# Patient Record
Sex: Female | Born: 1940 | Race: White | Hispanic: No | Marital: Married | Smoking: Never smoker
Health system: Southern US, Community
[De-identification: ages and names within clinical notes are randomized; demographics above are authoritative.]

## PROBLEM LIST (undated history)

## (undated) ENCOUNTER — Emergency Department (HOSPITAL_COMMUNITY): Admission: EM | Disposition: A | Discharge status: Other Acute Inpatient Hospital | Payer: Medicare Other

## (undated) DIAGNOSIS — I213 ST elevation (STEMI) myocardial infarction of unspecified site: Secondary | ICD-10-CM

## (undated) DIAGNOSIS — Z9889 Other specified postprocedural states: Secondary | ICD-10-CM

## (undated) DIAGNOSIS — T783XXA Angioneurotic edema, initial encounter: Secondary | ICD-10-CM

## (undated) DIAGNOSIS — I10 Essential (primary) hypertension: Secondary | ICD-10-CM

## (undated) DIAGNOSIS — Z5189 Encounter for other specified aftercare: Secondary | ICD-10-CM

## (undated) DIAGNOSIS — K219 Gastro-esophageal reflux disease without esophagitis: Secondary | ICD-10-CM

## (undated) DIAGNOSIS — R072 Precordial pain: Secondary | ICD-10-CM

## (undated) DIAGNOSIS — E790 Hyperuricemia without signs of inflammatory arthritis and tophaceous disease: Secondary | ICD-10-CM

## (undated) DIAGNOSIS — K648 Other hemorrhoids: Secondary | ICD-10-CM

## (undated) DIAGNOSIS — Z0389 Encounter for observation for other suspected diseases and conditions ruled out: Secondary | ICD-10-CM

## (undated) DIAGNOSIS — K449 Diaphragmatic hernia without obstruction or gangrene: Secondary | ICD-10-CM

## (undated) DIAGNOSIS — J449 Chronic obstructive pulmonary disease, unspecified: Secondary | ICD-10-CM

## (undated) DIAGNOSIS — Z8719 Personal history of other diseases of the digestive system: Secondary | ICD-10-CM

## (undated) DIAGNOSIS — M25473 Effusion, unspecified ankle: Secondary | ICD-10-CM

## (undated) DIAGNOSIS — K5790 Diverticulosis of intestine, part unspecified, without perforation or abscess without bleeding: Secondary | ICD-10-CM

## (undated) DIAGNOSIS — M2041 Other hammer toe(s) (acquired), right foot: Secondary | ICD-10-CM

## (undated) DIAGNOSIS — M1990 Unspecified osteoarthritis, unspecified site: Secondary | ICD-10-CM

## (undated) DIAGNOSIS — I82409 Acute embolism and thrombosis of unspecified deep veins of unspecified lower extremity: Secondary | ICD-10-CM

## (undated) DIAGNOSIS — IMO0001 Reserved for inherently not codable concepts without codable children: Secondary | ICD-10-CM

## (undated) DIAGNOSIS — D126 Benign neoplasm of colon, unspecified: Secondary | ICD-10-CM

## (undated) DIAGNOSIS — E785 Hyperlipidemia, unspecified: Secondary | ICD-10-CM

## (undated) DIAGNOSIS — E559 Vitamin D deficiency, unspecified: Secondary | ICD-10-CM

## (undated) DIAGNOSIS — H269 Unspecified cataract: Secondary | ICD-10-CM

## (undated) DIAGNOSIS — R9431 Abnormal electrocardiogram [ECG] [EKG]: Secondary | ICD-10-CM

## (undated) HISTORY — PX: CATARACT EXTRACTION, BILATERAL: SHX1313

## (undated) HISTORY — DX: Chronic obstructive pulmonary disease, unspecified: J44.9

## (undated) HISTORY — DX: Diverticulosis of intestine, part unspecified, without perforation or abscess without bleeding: K57.90

## (undated) HISTORY — DX: Acute embolism and thrombosis of unspecified deep veins of unspecified lower extremity: I82.409

## (undated) HISTORY — DX: Abnormal electrocardiogram (ECG) (EKG): R94.31

## (undated) HISTORY — DX: Diaphragmatic hernia without obstruction or gangrene: K44.9

## (undated) HISTORY — PX: UMBILICAL HERNIA REPAIR: SHX196

## (undated) HISTORY — DX: Precordial pain: R07.2

## (undated) HISTORY — DX: Encounter for other specified aftercare: Z51.89

## (undated) HISTORY — PX: COLONOSCOPY: SHX174

## (undated) HISTORY — DX: Personal history of other diseases of the digestive system: Z98.890

## (undated) HISTORY — DX: Benign neoplasm of colon, unspecified: D12.6

## (undated) HISTORY — DX: Personal history of other diseases of the digestive system: Z87.19

## (undated) HISTORY — PX: OTHER SURGICAL HISTORY: SHX169

## (undated) HISTORY — DX: Other hemorrhoids: K64.8

## (undated) HISTORY — PX: TOTAL HIP ARTHROPLASTY: SHX124

## (undated) HISTORY — DX: Reserved for inherently not codable concepts without codable children: IMO0001

## (undated) HISTORY — PX: TONSILLECTOMY: SUR1361

## (undated) HISTORY — DX: Hyperuricemia without signs of inflammatory arthritis and tophaceous disease: E79.0

## (undated) HISTORY — DX: Encounter for observation for other suspected diseases and conditions ruled out: Z03.89

## (undated) HISTORY — DX: Unspecified cataract: H26.9

## (undated) HISTORY — DX: Angioneurotic edema, initial encounter: T78.3XXA

## (undated) HISTORY — DX: ST elevation (STEMI) myocardial infarction of unspecified site: I21.3

## (undated) HISTORY — DX: Vitamin D deficiency, unspecified: E55.9

## (undated) HISTORY — PX: ABDOMINAL HYSTERECTOMY: SHX81

## (undated) HISTORY — PX: UPPER GASTROINTESTINAL ENDOSCOPY: SHX188

---
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
## 1991-05-20 HISTORY — PX: CARDIAC CATHETERIZATION: SHX172

## 1998-02-05 ENCOUNTER — Ambulatory Visit (HOSPITAL_COMMUNITY): Admission: RE | Admit: 1998-02-05 | Discharge: 1998-02-05 | Payer: Self-pay | Admitting: Obstetrics and Gynecology

## 1999-02-19 ENCOUNTER — Encounter: Payer: Self-pay | Admitting: Obstetrics and Gynecology

## 1999-02-19 ENCOUNTER — Ambulatory Visit (HOSPITAL_COMMUNITY): Admission: RE | Admit: 1999-02-19 | Discharge: 1999-02-19 | Payer: Self-pay | Admitting: Obstetrics and Gynecology

## 2000-02-28 ENCOUNTER — Encounter: Payer: Self-pay | Admitting: Internal Medicine

## 2000-02-28 ENCOUNTER — Ambulatory Visit (HOSPITAL_COMMUNITY): Admission: RE | Admit: 2000-02-28 | Discharge: 2000-02-28 | Payer: Self-pay | Admitting: Internal Medicine

## 2001-03-02 ENCOUNTER — Ambulatory Visit (HOSPITAL_COMMUNITY): Admission: RE | Admit: 2001-03-02 | Discharge: 2001-03-02 | Payer: Self-pay | Admitting: Obstetrics and Gynecology

## 2001-03-02 ENCOUNTER — Encounter: Payer: Self-pay | Admitting: Obstetrics and Gynecology

## 2001-05-19 HISTORY — PX: CARDIAC CATHETERIZATION: SHX172

## 2002-03-03 ENCOUNTER — Ambulatory Visit (HOSPITAL_COMMUNITY): Admission: RE | Admit: 2002-03-03 | Discharge: 2002-03-03 | Payer: Self-pay | Admitting: Obstetrics and Gynecology

## 2002-03-03 ENCOUNTER — Encounter: Payer: Self-pay | Admitting: Obstetrics and Gynecology

## 2003-03-07 ENCOUNTER — Ambulatory Visit (HOSPITAL_COMMUNITY): Admission: RE | Admit: 2003-03-07 | Discharge: 2003-03-07 | Payer: Self-pay | Admitting: Internal Medicine

## 2003-03-07 ENCOUNTER — Encounter: Payer: Self-pay | Admitting: Internal Medicine

## 2004-01-08 ENCOUNTER — Ambulatory Visit (HOSPITAL_COMMUNITY): Admission: RE | Admit: 2004-01-08 | Discharge: 2004-01-08 | Payer: Self-pay | Admitting: Internal Medicine

## 2004-03-12 ENCOUNTER — Ambulatory Visit (HOSPITAL_COMMUNITY): Admission: RE | Admit: 2004-03-12 | Discharge: 2004-03-12 | Payer: Self-pay | Admitting: Obstetrics and Gynecology

## 2004-03-21 ENCOUNTER — Ambulatory Visit: Payer: Self-pay | Admitting: Gastroenterology

## 2004-03-25 ENCOUNTER — Ambulatory Visit: Payer: Self-pay | Admitting: Gastroenterology

## 2004-04-09 ENCOUNTER — Encounter: Admission: RE | Admit: 2004-04-09 | Discharge: 2004-04-09 | Payer: Self-pay | Admitting: Otolaryngology

## 2004-06-25 ENCOUNTER — Ambulatory Visit: Payer: Self-pay | Admitting: Gastroenterology

## 2004-08-27 ENCOUNTER — Ambulatory Visit (HOSPITAL_COMMUNITY): Admission: RE | Admit: 2004-08-27 | Discharge: 2004-08-27 | Payer: Self-pay | Admitting: Gastroenterology

## 2004-09-13 ENCOUNTER — Ambulatory Visit: Payer: Self-pay | Admitting: Gastroenterology

## 2005-03-17 ENCOUNTER — Ambulatory Visit (HOSPITAL_COMMUNITY): Admission: RE | Admit: 2005-03-17 | Discharge: 2005-03-17 | Payer: Self-pay | Admitting: Obstetrics and Gynecology

## 2005-04-14 ENCOUNTER — Ambulatory Visit (HOSPITAL_COMMUNITY): Admission: RE | Admit: 2005-04-14 | Discharge: 2005-04-14 | Payer: Self-pay | Admitting: Internal Medicine

## 2006-03-19 ENCOUNTER — Ambulatory Visit (HOSPITAL_COMMUNITY): Admission: RE | Admit: 2006-03-19 | Discharge: 2006-03-19 | Payer: Self-pay | Admitting: Internal Medicine

## 2007-03-23 ENCOUNTER — Ambulatory Visit (HOSPITAL_COMMUNITY): Admission: RE | Admit: 2007-03-23 | Discharge: 2007-03-23 | Payer: Self-pay | Admitting: Obstetrics and Gynecology

## 2007-03-29 ENCOUNTER — Encounter: Admission: RE | Admit: 2007-03-29 | Discharge: 2007-03-29 | Payer: Self-pay | Admitting: Obstetrics and Gynecology

## 2007-04-25 ENCOUNTER — Inpatient Hospital Stay (HOSPITAL_COMMUNITY): Admission: EM | Admit: 2007-04-25 | Discharge: 2007-05-01 | Payer: Self-pay | Admitting: Emergency Medicine

## 2007-12-21 ENCOUNTER — Ambulatory Visit: Payer: Self-pay | Admitting: Gastroenterology

## 2008-01-04 ENCOUNTER — Ambulatory Visit: Payer: Self-pay | Admitting: Gastroenterology

## 2008-03-29 ENCOUNTER — Ambulatory Visit (HOSPITAL_COMMUNITY): Admission: RE | Admit: 2008-03-29 | Discharge: 2008-03-29 | Payer: Self-pay | Admitting: Internal Medicine

## 2009-04-05 ENCOUNTER — Ambulatory Visit (HOSPITAL_COMMUNITY): Admission: RE | Admit: 2009-04-05 | Discharge: 2009-04-05 | Payer: Self-pay | Admitting: Internal Medicine

## 2010-04-25 ENCOUNTER — Ambulatory Visit (HOSPITAL_COMMUNITY)
Admission: RE | Admit: 2010-04-25 | Discharge: 2010-04-25 | Payer: Self-pay | Source: Home / Self Care | Attending: Internal Medicine | Admitting: Internal Medicine

## 2010-10-01 NOTE — Op Note (Signed)
Angela Preston, Angela Preston NO.:  [id number]   MEDICAL RECORD NO.:  [id number]          PATIENT TYPE:  INP   LOCATION:  1611                         FACILITY:  [facility]   PHYSICIAN:  Harvie Junior, M.D.   DATE OF BIRTH:  [birth year]   DATE OF PROCEDURE:  04/26/2007  DATE OF DISCHARGE:  05/01/2007                               OPERATIVE REPORT   PREOPERATIVE DIAGNOSIS:  Femoral neck fracture, left.   POSTOPERATIVE DIAGNOSIS:  Femoral neck fracture, left.   PROCEDURE:  Cemented hemiarthroplasty, left hip, with a [place]  basic cemented stem size 4, 43 mm nodular hip ball with a plus 0 offset,  a size 4 cement restricter, and a 10 mm stem centralizer.   SURGEON:  Harvie Junior, M.D.   ASSISTANT:  [name], P.A.-C.   ANESTHESIA:  General.   BRIEF HISTORY:  Angela Preston is a 70 year old female with a history of  having fallen suffering a left femoral neck fracture.  We talked about  treatment options and it was felt that cemented hemiarthroplasty would  be the most appropriate course of action.  She is brought to the  operating room for this procedure.   DESCRIPTION OF PROCEDURE:  The patient was brought to the operating  room.  After adequate anesthesia was obtained with general anesthetic,  the patient was placed supine on the operating table.  She was then  moved In the right lateral decubitus position. All bony prominences were  well padded and an axillary roll was put in place.  Attention was turned  to the left hip where a posterior incision was made for a posterior  approach to the hip.  Dissection was carried down to the level of the  tensor fascia which was divided in line with the fibers extending this  down to the gluteus maximus muscle which  was finger fractured  proximally.  Charnley retractors were put in place.  Attention was then  turned to the posterior aspect of the hip where the piriformis and short  external rotators and capsule were  tagged and the femoral head was then  removed.  It measured on the back table to a size 43.  A trial 43 ball  was put in the acetabulum and felt to be the appropriate size.  Attention was turned to the femoral side where an introductory reamer  was used initiating reamer to localize this, we used a lateralizing  reamer, and following this, we sequentially rasped to a level of 4 and a  43 mm plus 0 ball was trialed.  At this point, excellent range of motion  and stability were achieved.  The provisional neck cut had been made in  a calcar planer was used to get the neck cut down appropriately. At this  point, this was removed and following this, the canal was sounded and  size 4 was the appropriate size and this was opened and pushed into  place and following this, the cement was mixed into a gun and after  irrigating the canal, a size 4 stem was placed after the cement was  placed in the stem and pressurized with a pressurization gun.  The size  4 stem was put in place, held in the appropriate anteversion until  hardened, and once this was the case, we re-trialed the 43 plus 0 ball.  Excellent range of motion and stability were achieved.  The final 43  plus 0 mm ball was put in place and the hip again put through a range of  motion.  Excellent stability and range of motion was achieved.  At this  point, the short external rotators and posterior capsule were repaired  to the  intertrochanteric line posteriorly through drill holes. The tensor  fascia was closed with #1 Vicryl running, skin with 0 and 2-0 Vicryl,  and skin staples.  A sterile compressive dressing was applied as well as  knee immobilizer.  The patient was taken to the recovery room in  satisfactory condition.  Estimated blood loss was 250 mL.      Harvie Junior, M.D.  Electronically Signed     JLG/MEDQ  D:  06/16/2007  T:  06/16/2007  Job:  [id number]

## 2010-10-04 NOTE — Op Note (Signed)
Angela Preston, Angela Preston              ACCOUNT NO.:  [id number]   MEDICAL RECORD NO.:  [id number]          PATIENT TYPE:  AMB   LOCATION:  ENDO                         FACILITY:  [facility]   PHYSICIAN:  Malcolm T. Russella Dar, M.D. [facility] OF BIRTH:  07/15/1940   DATE OF PROCEDURE:  09/16/2004  DATE OF DISCHARGE:  08/27/2004                                 OPERATIVE REPORT   PROCEDURE:  Esophageal manometry.   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  This study is performed while on Nexium twice a day.   RESULTS:  1.  Upper esophageal sphincter study reveals normal pressures and normal      relaxation.  2.  Esophageal body reveals normal peristalsis with 100% of swallows      transmitted and normal pressures.  3.  Lower esophageal sphincter demonstrates normal pressures and normal      relaxation.  The resting pressure was 20.5 mmHg and the residual      pressure was 5.1 mmHg with 75% relaxation.   ASSESSMENT AND PLAN:  Normal esophageal manometry study.  Proceed with 24  hour pH study.      MTS/MEDQ  D:  09/16/2004  T:  09/16/2004  Job:  [id number]   cc:   Lucky Cowboy, M.D.  [address]  [place], [place] [address]  Fax: [phone]   Kinnie Scales. Annalee Genta, M.D.  [address] [place]  [place] [address]  Fax: [phone]

## 2010-10-04 NOTE — Discharge Summary (Signed)
Angela Preston, GAINS NO.:  [id number]   MEDICAL RECORD NO.:  [id number]          PATIENT TYPE:  INP   LOCATION:  1611                         FACILITY:  [facility]   PHYSICIAN:  Harvie Junior, M.D.   DATE OF BIRTH:  11-28-1940   DATE OF ADMISSION:  04/25/2007  DATE OF DISCHARGE:  05/01/2007                               DISCHARGE SUMMARY   ADMITTING DIAGNOSIS:  1. Displaced femoral neck fracture, left hip.  2. Hypertension.  3. Gastritis.  4. Gastroesophageal reflux disease.  5. Coronary artery disease.  6. Hyperlipidemia.  7. Depression.   DISCHARGE DIAGNOSES:  1. Displaced femoral neck fracture, left hip.  2. Hypertension.  3. Gastritis.  4. Gastroesophageal reflux disease.  5. Coronary artery disease.  6. Hyperlipidemia.  7. Depression.  8. Acute blood loss anemia.   PROCEDURES IN THE HOSPITAL:  Cemented hemiarthroplasty left hip, Jodi Geralds, MD, April 26, 2007.   BRIEF HISTORY:  Angela Preston is a pleasant, overall, healthy, 70 year old  female who was at a nursing home visiting her mother. She had a fall and  complained of pain in her left hip, she is unable to weight bear on her  left hip.  She had no previous history of hip injury.  She is unable to  weightbear and had pain with range of motion of her left hip.  She was  brought to the  [facility] emergency room where x-rays  confirmed an impacted displaced left femoral neck fracture. She is  admitted for treatment and surgical intervention of this significant  left hip fracture.   PERTINENT LABORATORY STUDIES:  The patient's hemoglobin on admission was  11.6, hematocrit 33.1. Postop day #1 her  hemoglobin was 10.6, postop  day number two it was 10.1, postoperative day number 3 it was 9.5.  Protime on admission was 15.3 seconds with an INR of 1.2. On the date of  discharge, her protime was 21.5 seconds with an INR of 1.8 on Coumadin  therapy.  Preoperatively, her potassium was 3.0 and  a glucose 130  otherwise BMET was within normal limits.  On postop day #1, her  potassium was up to 3.7,  on postop day #3 her potassium was 3.4.  Urinalysis showed no abnormalities.  EKG on  April 25, 2007 showed  normal sinus rhythm, normal EKG.  X-rays of the hip and pelvis showed an  impacted subcapital left femoral neck fracture. The preoperative chest x-  ray April 25, 2007 showed no active cardiopulmonary disease. Chest x-  ray on April 26, 2007 showed elevation of the right hemidiaphragm with  mild atelectasis, volume loss of the lung base.  Postoperative x-rays of  the pelvis and left hip showed anatomic alignment status post left hip  arthroplasty without acute complicating features.   HOSPITAL COURSE:  The patient was admitted through the emergency room,  pain medication was brought to the operating room on April 26, 2007.  The patient underwent smooth hemiarthroplasty left hip as well as  described in Dr. Luiz Blare' operative note.  Preoperatively she was given a  gram of Ancef.  She was also noted to have decreased potassium and  potassium was added to the IV fluid.  On April 26, 2007 underwent  hemiarthroplasty of the left hip without complications. On postop day #1  she complained of feeling dizzy.  She was taking fluids okay, she was  not out of bed yet. Her blood pressure was 90/50 and her vital signs  were stable, otherwise O2 sats were 98% on 2 liters. Hemoglobin was  10.6, INR 1.2 and BMET was essentially normal.  IV fluids were  continued.  Her blood pressure medications were held until her blood  pressure increased,  her pain medicines were changed from Percocet to  Vicodin as I feel like the Percocet was making her dizzy.  On  postoperative day #2 she was feeling better.  She had some mild  dizziness but no obvious vertigo findings. She was taking fluids without  difficulty.  Her vital signs were stable.  Her blood pressure was still  only 92/52, IV fluids  were continued and she was taking her oral  medication without difficulty. Her O2 sats were 91% on room air.  Her  INR was 1.5.  Her Foley catheter was discontinued later in the day and  her blood pressure medicines continued to be held.  She made good  progress, her vital signs were stable and afebrile on further days.  She  improved with physical therapy and was allowed to weightbear as  tolerated with a walker. On April 30, 2007, she needed one more day  of physical therapy for safe bed to chair transfers and mobility. She  was discharged home on May 01, 2007 in improved condition.  She is  on a regular diet.  Activity status will be weightbearing as tolerated  on the left with a walker.  She will need home health physical therapy  for walker ambulation, weightbearing as tolerated on the left.  She also  needs Coumadin management and home health RN for pro times x1 month  postop.   MEDICATIONS AT DISCHARGE:  1. Percocet 5 mg 1-2 q. 4-6 hours p.r.n. pain.  2. Robaxin 750 mg 1 q.6 hours p.r.n. spasm.  3. Coumadin 5 mg 1 daily and then adjust dose per pharmacy x1 month      postop.   She will keep her left hip wound clean and dry and she will need a  dressing change every third day.  She will follow up with Dr. Luiz Blare in  his office in 10-14 days. She is notified to call our office at [phone]  to make that appointment.      [name], P.A.      Harvie Junior, M.D.  Electronically Signed    JB/MEDQ  D:  06/16/2007  T:  06/17/2007  Job:  [id number]   cc:   Lucky Cowboy, M.D.  Fax: [phone]

## 2010-10-04 NOTE — Op Note (Signed)
Angela Preston, Angela Preston              ACCOUNT NO.:  [id number]   MEDICAL RECORD NO.:  [id number]          PATIENT TYPE:  AMB   LOCATION:  ENDO                         FACILITY:  [facility]   PHYSICIAN:  Malcolm T. Russella Dar, M.D. [facility] OF BIRTH:  [birth year]   DATE OF PROCEDURE:  08/27/2004  DATE OF DISCHARGE:  08/27/2004                                 OPERATIVE REPORT   PROCEDURE:  Twenty-four-hour ambulatory pH probe.   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  This study is performed while on Nexium 40 mg p.o. b.i.d. to assess efficacy  of therapy.   In the proximal channel, 0.6% of the time was below pH 4. In the distal  channel, 2.1% of the time was below pH 4. The combined Johnson-DeMeester  score was 4.2 with a normal score being below 22.   IMPRESSION:  Adequate acid suppression on Nexium 40 mg b.i.d. with very good  control of esophageal acid exposure.   RECOMMENDATION:  1.  Continue Nexium 40 mg p.o. b.i.d.  2.  Schedule return office visit.      MTS/MEDQ  D:  09/16/2004  T:  09/16/2004  Job:  [id number]

## 2011-02-24 LAB — CBC
HCT: 28.6 — ABNORMAL LOW
HCT: 30.6 — ABNORMAL LOW
HCT: 33.1 — ABNORMAL LOW
Hemoglobin: 10.1 — ABNORMAL LOW
Hemoglobin: 10.6 — ABNORMAL LOW
Hemoglobin: 11.6 — ABNORMAL LOW
MCHC: 34.6
MCHC: 35.1
MCHC: 35.3
MCV: 92.3
MCV: 93.2
MCV: 93.4
Platelets: 157
Platelets: 188
Platelets: 198
Platelets: 263
RBC: 2.92 — ABNORMAL LOW
RBC: 3.06 — ABNORMAL LOW
RBC: 3.28 — ABNORMAL LOW
RBC: 3.59 — ABNORMAL LOW
RDW: 12.3
RDW: 12.3
RDW: 12.6
WBC: 10
WBC: 10.4
WBC: 12.8 — ABNORMAL HIGH
WBC: 8.8

## 2011-02-24 LAB — BASIC METABOLIC PANEL
BUN: 19
BUN: 5 — ABNORMAL LOW
BUN: 6
CO2: 27
CO2: 27
CO2: 28
Calcium: 7.9 — ABNORMAL LOW
Calcium: 8 — ABNORMAL LOW
Calcium: 8.3 — ABNORMAL LOW
Calcium: 8.8
Chloride: 101
Chloride: 104
Chloride: 108
Creatinine, Ser: 0.82
Creatinine, Ser: 0.89
Creatinine, Ser: 0.95
Creatinine, Ser: 0.96
GFR calc Af Amer: >60
GFR calc Af Amer: >60
GFR calc Af Amer: >60
GFR calc Af Amer: >60
GFR calc non Af Amer: 58 — ABNORMAL LOW
GFR calc non Af Amer: 59 — ABNORMAL LOW
GFR calc non Af Amer: >60
Glucose, Bld: 111 — ABNORMAL HIGH
Glucose, Bld: 130 — ABNORMAL HIGH
Glucose, Bld: 135 — ABNORMAL HIGH
Potassium: 3 — ABNORMAL LOW
Potassium: 3.7
Potassium: 3.7
Sodium: 135
Sodium: 138
Sodium: 141

## 2011-02-24 LAB — PROTIME-INR
INR: 1.2
INR: 1.3
INR: 1.4
INR: 1.5
Prothrombin Time: 15.3 — ABNORMAL HIGH
Prothrombin Time: 16 — ABNORMAL HIGH
Prothrombin Time: 17.6 — ABNORMAL HIGH
Prothrombin Time: 18.5 — ABNORMAL HIGH
Prothrombin Time: 21.5 — ABNORMAL HIGH

## 2011-02-24 LAB — DIFFERENTIAL
Basophils Absolute: 0
Basophils Relative: 0
Eosinophils Absolute: 0.2
Eosinophils Relative: 1
Lymphocytes Relative: 14
Lymphs Abs: 1.7
Monocytes Absolute: 0.9
Monocytes Relative: 7
Neutro Abs: 9.9 — ABNORMAL HIGH
Neutrophils Relative %: 78 — ABNORMAL HIGH

## 2011-02-24 LAB — URINALYSIS, ROUTINE W REFLEX MICROSCOPIC
Bilirubin Urine: NEGATIVE
Glucose, UA: NEGATIVE
Hgb urine dipstick: NEGATIVE
Ketones, ur: NEGATIVE
Nitrite: NEGATIVE
Protein, ur: NEGATIVE
Specific Gravity, Urine: 1.015
Urobilinogen, UA: 0.2
pH: 8

## 2011-02-24 LAB — TYPE AND SCREEN
ABO/RH(D): A POS
Antibody Screen: NEGATIVE

## 2011-02-24 LAB — ABO/RH: ABO/RH(D): A POS

## 2011-05-21 ENCOUNTER — Other Ambulatory Visit (HOSPITAL_COMMUNITY): Payer: Self-pay | Admitting: Internal Medicine

## 2011-05-21 DIAGNOSIS — Z1231 Encounter for screening mammogram for malignant neoplasm of breast: Secondary | ICD-10-CM

## 2011-06-16 ENCOUNTER — Ambulatory Visit (HOSPITAL_COMMUNITY)
Admission: RE | Admit: 2011-06-16 | Discharge: 2011-06-16 | Disposition: A | Discharge status: Home / Self Care | Payer: Medicare Other | Source: Ambulatory Visit | Attending: Internal Medicine | Admitting: Internal Medicine

## 2011-06-16 DIAGNOSIS — Z1231 Encounter for screening mammogram for malignant neoplasm of breast: Secondary | ICD-10-CM | POA: Insufficient documentation

## 2011-06-23 DIAGNOSIS — Z79899 Other long term (current) drug therapy: Secondary | ICD-10-CM | POA: Diagnosis not present

## 2011-06-23 DIAGNOSIS — E782 Mixed hyperlipidemia: Secondary | ICD-10-CM | POA: Diagnosis not present

## 2011-06-23 DIAGNOSIS — E559 Vitamin D deficiency, unspecified: Secondary | ICD-10-CM | POA: Diagnosis not present

## 2011-06-23 DIAGNOSIS — R7309 Other abnormal glucose: Secondary | ICD-10-CM | POA: Diagnosis not present

## 2011-06-23 DIAGNOSIS — I10 Essential (primary) hypertension: Secondary | ICD-10-CM | POA: Diagnosis not present

## 2011-06-23 DIAGNOSIS — M109 Gout, unspecified: Secondary | ICD-10-CM | POA: Diagnosis not present

## 2011-06-30 DIAGNOSIS — Z124 Encounter for screening for malignant neoplasm of cervix: Secondary | ICD-10-CM | POA: Diagnosis not present

## 2011-06-30 DIAGNOSIS — Z01419 Encounter for gynecological examination (general) (routine) without abnormal findings: Secondary | ICD-10-CM | POA: Diagnosis not present

## 2011-07-08 DIAGNOSIS — M810 Age-related osteoporosis without current pathological fracture: Secondary | ICD-10-CM | POA: Diagnosis not present

## 2011-07-08 DIAGNOSIS — N951 Menopausal and female climacteric states: Secondary | ICD-10-CM | POA: Diagnosis not present

## 2011-07-08 DIAGNOSIS — Z8262 Family history of osteoporosis: Secondary | ICD-10-CM | POA: Diagnosis not present

## 2011-08-28 DIAGNOSIS — M810 Age-related osteoporosis without current pathological fracture: Secondary | ICD-10-CM | POA: Diagnosis not present

## 2011-09-30 ENCOUNTER — Encounter (HOSPITAL_BASED_OUTPATIENT_CLINIC_OR_DEPARTMENT_OTHER): Payer: Self-pay | Admitting: Emergency Medicine

## 2011-09-30 ENCOUNTER — Emergency Department (INDEPENDENT_AMBULATORY_CARE_PROVIDER_SITE_OTHER): Payer: Medicare Other

## 2011-09-30 ENCOUNTER — Emergency Department (HOSPITAL_BASED_OUTPATIENT_CLINIC_OR_DEPARTMENT_OTHER)
Admission: EM | Admit: 2011-09-30 | Discharge: 2011-09-30 | Disposition: A | Discharge status: Home / Self Care | Payer: Medicare Other | Attending: Emergency Medicine | Admitting: Emergency Medicine

## 2011-09-30 DIAGNOSIS — M25473 Effusion, unspecified ankle: Secondary | ICD-10-CM | POA: Insufficient documentation

## 2011-09-30 DIAGNOSIS — M25579 Pain in unspecified ankle and joints of unspecified foot: Secondary | ICD-10-CM | POA: Insufficient documentation

## 2011-09-30 DIAGNOSIS — W19XXXA Unspecified fall, initial encounter: Secondary | ICD-10-CM

## 2011-09-30 DIAGNOSIS — E785 Hyperlipidemia, unspecified: Secondary | ICD-10-CM | POA: Diagnosis not present

## 2011-09-30 DIAGNOSIS — R609 Edema, unspecified: Secondary | ICD-10-CM | POA: Insufficient documentation

## 2011-09-30 DIAGNOSIS — Z79899 Other long term (current) drug therapy: Secondary | ICD-10-CM | POA: Insufficient documentation

## 2011-09-30 DIAGNOSIS — R937 Abnormal findings on diagnostic imaging of other parts of musculoskeletal system: Secondary | ICD-10-CM

## 2011-09-30 DIAGNOSIS — S9000XA Contusion of unspecified ankle, initial encounter: Secondary | ICD-10-CM | POA: Diagnosis not present

## 2011-09-30 DIAGNOSIS — S93409A Sprain of unspecified ligament of unspecified ankle, initial encounter: Secondary | ICD-10-CM | POA: Diagnosis not present

## 2011-09-30 DIAGNOSIS — M810 Age-related osteoporosis without current pathological fracture: Secondary | ICD-10-CM | POA: Diagnosis not present

## 2011-09-30 DIAGNOSIS — I10 Essential (primary) hypertension: Secondary | ICD-10-CM | POA: Diagnosis not present

## 2011-09-30 DIAGNOSIS — M25476 Effusion, unspecified foot: Secondary | ICD-10-CM | POA: Insufficient documentation

## 2011-09-30 DIAGNOSIS — Y93H2 Activity, gardening and landscaping: Secondary | ICD-10-CM | POA: Insufficient documentation

## 2011-09-30 HISTORY — DX: Hyperlipidemia, unspecified: E78.5

## 2011-09-30 HISTORY — DX: Essential (primary) hypertension: I10

## 2011-09-30 MED ORDER — HYDROCODONE-ACETAMINOPHEN 5-325 MG PO TABS: 1.0000 | ORAL_TABLET | ORAL | Status: AC | PRN

## 2011-09-30 NOTE — Discharge Instructions (Signed)
Ankle Sprain An ankle sprain is an injury to the strong, fibrous tissues (ligaments) that hold the bones of your ankle joint together.  CAUSES Ankle sprain usually is caused by a fall or by twisting your ankle. People who participate in sports are more prone to these types of injuries.  SYMPTOMS  Symptoms of ankle sprain include:  Pain in your ankle. The pain may be present at rest or only when you are trying to stand or walk.   Swelling.   Bruising. Bruising may develop immediately or within 1 to 2 days after your injury.   Difficulty standing or walking.  DIAGNOSIS  Your caregiver will ask you details about your injury and perform a physical exam of your ankle to determine if you have an ankle sprain. During the physical exam, your caregiver will press and squeeze specific areas of your foot and ankle. Your caregiver will try to move your ankle in certain ways. An X-ray exam may be done to be sure a bone was not broken or a ligament did not separate from one of the bones in your ankle (avulsion).  TREATMENT  Certain types of braces can help stabilize your ankle. Your caregiver can make a recommendation for this. Your caregiver may recommend the use of medication for pain. If your sprain is severe, your caregiver may refer you to a surgeon who helps to restore function to parts of your skeletal system (orthopedist) or a physical therapist. HOME CARE INSTRUCTIONS  Apply ice to your injury for 1 to 2 days or as directed by your caregiver. Applying ice helps to reduce inflammation and pain.  Put ice in a plastic bag.   Place a towel between your skin and the bag.   Leave the ice on for 15 to 20 minutes at a time, every 2 hours while you are awake.   Take over-the-counter or prescription medicines for pain, discomfort, or fever only as directed by your caregiver.   Keep your injured leg elevated, when possible, to lessen swelling.   If your caregiver recommends crutches, use them as  instructed. Gradually, put weight on the affected ankle. Continue to use crutches or a cane until you can walk without feeling pain in your ankle.   If you have a plaster splint, wear the splint as directed by your caregiver. Do not rest it on anything harder than a pillow the first 24 hours. Do not put weight on it. Do not get it wet. You may take it off to take a shower or bath.   You may have been given an elastic bandage to wear around your ankle to provide support. If the elastic bandage is too tight (you have numbness or tingling in your foot or your foot becomes cold and blue), adjust the bandage to make it comfortable.   If you have an air splint, you may blow more air into it or let air out to make it more comfortable. You may take your splint off at night and before taking a shower or bath.   Wiggle your toes in the splint several times per day if you are able.  SEEK MEDICAL CARE IF:   You have an increase in bruising, swelling, or pain.   Your toes feel cold.   Pain relief is not achieved with medication.  SEEK IMMEDIATE MEDICAL CARE IF: Your toes are numb or blue or you have severe pain. MAKE SURE YOU:   Understand these instructions.   Will watch your condition.     Will get help right away if you are not doing well or get worse.  Document Released: 05/05/2005 Document Revised: 04/24/2011 Document Reviewed: 12/08/2007 [employer] Patient Information 2012 [employer]. 

## 2011-09-30 NOTE — ED Provider Notes (Signed)
History     CSN: [id number]  Arrival date & time 09/30/11  7829   First MD Initiated Contact with Patient 09/30/11 1003      Chief Complaint  Patient presents with  . Ankle Pain    (Consider location/radiation/quality/duration/timing/severity/associated sxs/prior treatment) Patient is a 71 y.o. female presenting with ankle pain. The history is provided by the patient.  Ankle Pain  The incident occurred more than 1 week ago. The incident occurred in the yard. The injury mechanism was a fall. The pain is present in the left ankle. The quality of the pain is described as sharp. Pertinent negatives include no numbness, no inability to bear weight and no muscle weakness. The symptoms are aggravated by activity and palpation.  Pt fell 2 weeks ago onto L ankle and has had persistent pain and swelling without improvement.   Past Medical History  Diagnosis Date  . Hypertension   . Osteoporosis   . Hyperlipidemia     Past Surgical History  Procedure Date  . Total hip arthroplasty   . Abdominal hysterectomy     No family history on file.  History  Substance Use Topics  . Smoking status: Never Smoker   . Smokeless tobacco: Not on file  . Alcohol Use: No    OB History    Grav Para Term Preterm Abortions TAB SAB Ect Mult Living                  Review of Systems  Musculoskeletal: Positive for joint swelling and arthralgias. Negative for back pain.  Neurological: Negative for dizziness, syncope, weakness, light-headedness and numbness.    Allergies  Review of patient's allergies indicates no known allergies.  Home Medications   Current Outpatient Rx  Name Route Sig Dispense Refill  . ALLOPURINOL 300 MG PO TABS Oral Take 300 mg by mouth daily.    . BUPROPION HCL ER (SR) 150 MG PO TB12 Oral Take 150 mg by mouth 2 (two) times daily.    Marland Kitchen CALCIUM CARBONATE-VITAMIN D 500-200 MG-UNIT PO TABS Oral Take 1 tablet by mouth daily.    Marland Kitchen ESOMEPRAZOLE MAGNESIUM 40 MG PO CPDR Oral  Take 40 mg by mouth daily before breakfast.    . OMEGA-3 FATTY ACIDS 1000 MG PO CAPS Oral Take 2 g by mouth daily.    Marland Kitchen GEMFIBROZIL 600 MG PO TABS Oral Take 600 mg by mouth 2 (two) times daily before a meal.    . ISOSORBIDE MONONITRATE ER 30 MG PO TB24 Oral Take 30 mg by mouth daily.    . MULTI-VITAMIN/MINERALS PO TABS Oral Take 1 tablet by mouth daily.    . TRIAMTERENE-HCTZ 75-50 MG PO TABS Oral Take 1 tablet by mouth daily.    Marland Kitchen VITAMIN D (ERGOCALCIFEROL) 50000 UNITS PO CAPS Oral Take 50,000 Units by mouth every other day.    Marland Kitchen HYDROCODONE-ACETAMINOPHEN 5-325 MG PO TABS Oral Take 1 tablet by mouth every 4 (four) hours as needed for pain. 10 tablet 0    BP 166/60  Pulse 60  Temp(Src) 97.8 F (36.6 C) (Oral)  Resp 16  Wt 138 lb (62.596 kg)  SpO2 100%  Physical Exam  Constitutional: She is oriented to person, place, and time. She appears well-developed and well-nourished.  HENT:  Head: Normocephalic and atraumatic.  Neck: Normal range of motion. Neck supple.  Pulmonary/Chest: Effort normal.  Abdominal: Soft.  Musculoskeletal: She exhibits edema and tenderness.       FROM L hip and knee. L ankle  swelling with TTP over lat and med mal. No calcaneous TTP. 2+DP. Sensation intact. Normal strength.   Neurological: She is alert and oriented to person, place, and time.  Skin: Skin is warm and dry.       Mild bruising noted around ankle and foot on the L    ED Course  Procedures (including critical care time)  Labs Reviewed - No data to display Dg Ankle Complete Left  09/30/2011  *RADIOLOGY REPORT*  Clinical Data: Ankle pain post fall 2 weeks ago  LEFT ANKLE COMPLETE - 3+ VIEW  Comparison: None.  Findings: Three views of the left ankle submitted.  No ankle fracture or subluxation.  Ankle mortise is preserved.  There is a vague lucency within the calcaneus.  I cannot exclude a subtle impacted calcaneal fracture.  Clinical correlation is necessary.  IMPRESSION:  No ankle fracture or  subluxation.  Ankle mortise is preserved. There is a vague lucency within the calcaneus.  I cannot exclude a subtle impacted calcaneal fracture.  Clinical correlation is necessary.  Original Report Authenticated By: Natasha Mead, M.D.     1. Ankle sprain       MDM   instructed to stay off ankle and keep elevated. If pain continues, f/u with ortho. Though radiologist suggests possible calcaneal fracture, clinically pt has no pain over calcaneus and I do not believe this represents fracture.       Loren Racer, MD 09/30/11 1121

## 2011-09-30 NOTE — ED Notes (Signed)
Larey Seat while performing yard work

## 2011-11-06 DIAGNOSIS — M25579 Pain in unspecified ankle and joints of unspecified foot: Secondary | ICD-10-CM | POA: Diagnosis not present

## 2011-11-06 DIAGNOSIS — S93419A Sprain of calcaneofibular ligament of unspecified ankle, initial encounter: Secondary | ICD-10-CM | POA: Diagnosis not present

## 2011-11-17 DIAGNOSIS — E559 Vitamin D deficiency, unspecified: Secondary | ICD-10-CM | POA: Diagnosis not present

## 2011-11-17 DIAGNOSIS — Z79899 Other long term (current) drug therapy: Secondary | ICD-10-CM | POA: Diagnosis not present

## 2011-11-17 DIAGNOSIS — E782 Mixed hyperlipidemia: Secondary | ICD-10-CM | POA: Diagnosis not present

## 2011-11-17 DIAGNOSIS — M109 Gout, unspecified: Secondary | ICD-10-CM | POA: Diagnosis not present

## 2011-11-17 DIAGNOSIS — R7309 Other abnormal glucose: Secondary | ICD-10-CM | POA: Diagnosis not present

## 2011-11-17 DIAGNOSIS — Z1212 Encounter for screening for malignant neoplasm of rectum: Secondary | ICD-10-CM | POA: Diagnosis not present

## 2011-11-17 DIAGNOSIS — I10 Essential (primary) hypertension: Secondary | ICD-10-CM | POA: Diagnosis not present

## 2012-02-26 DIAGNOSIS — R7309 Other abnormal glucose: Secondary | ICD-10-CM | POA: Diagnosis not present

## 2012-02-26 DIAGNOSIS — I10 Essential (primary) hypertension: Secondary | ICD-10-CM | POA: Diagnosis not present

## 2012-02-26 DIAGNOSIS — E559 Vitamin D deficiency, unspecified: Secondary | ICD-10-CM | POA: Diagnosis not present

## 2012-02-26 DIAGNOSIS — E782 Mixed hyperlipidemia: Secondary | ICD-10-CM | POA: Diagnosis not present

## 2012-02-26 DIAGNOSIS — Z79899 Other long term (current) drug therapy: Secondary | ICD-10-CM | POA: Diagnosis not present

## 2012-03-04 DIAGNOSIS — Z23 Encounter for immunization: Secondary | ICD-10-CM | POA: Diagnosis not present

## 2012-03-23 DIAGNOSIS — R079 Chest pain, unspecified: Secondary | ICD-10-CM | POA: Diagnosis not present

## 2012-03-23 DIAGNOSIS — I10 Essential (primary) hypertension: Secondary | ICD-10-CM | POA: Diagnosis not present

## 2012-03-23 DIAGNOSIS — K219 Gastro-esophageal reflux disease without esophagitis: Secondary | ICD-10-CM | POA: Diagnosis not present

## 2012-03-24 DIAGNOSIS — I10 Essential (primary) hypertension: Secondary | ICD-10-CM | POA: Diagnosis not present

## 2012-03-24 DIAGNOSIS — E782 Mixed hyperlipidemia: Secondary | ICD-10-CM | POA: Diagnosis not present

## 2012-03-24 DIAGNOSIS — R079 Chest pain, unspecified: Secondary | ICD-10-CM | POA: Diagnosis not present

## 2012-03-24 DIAGNOSIS — R0602 Shortness of breath: Secondary | ICD-10-CM | POA: Diagnosis not present

## 2012-04-01 ENCOUNTER — Other Ambulatory Visit (HOSPITAL_COMMUNITY): Payer: Self-pay

## 2012-04-01 DIAGNOSIS — I10 Essential (primary) hypertension: Secondary | ICD-10-CM

## 2012-04-01 DIAGNOSIS — I428 Other cardiomyopathies: Secondary | ICD-10-CM

## 2012-04-12 ENCOUNTER — Ambulatory Visit (HOSPITAL_COMMUNITY)
Admission: RE | Admit: 2012-04-12 | Discharge: 2012-04-12 | Disposition: A | Discharge status: Home / Self Care | Payer: Medicare Other | Source: Ambulatory Visit | Attending: Cardiovascular Disease | Admitting: Cardiovascular Disease

## 2012-04-12 DIAGNOSIS — I10 Essential (primary) hypertension: Secondary | ICD-10-CM | POA: Insufficient documentation

## 2012-04-12 DIAGNOSIS — I428 Other cardiomyopathies: Secondary | ICD-10-CM

## 2012-04-12 DIAGNOSIS — I2510 Atherosclerotic heart disease of native coronary artery without angina pectoris: Secondary | ICD-10-CM | POA: Diagnosis not present

## 2012-04-12 HISTORY — PX: TRANSTHORACIC ECHOCARDIOGRAM: SHX275

## 2012-04-12 NOTE — Progress Notes (Signed)
2D Echo Performed 04/12/2012    Stevi Hollinshead, RCS  

## 2012-04-27 DIAGNOSIS — R0602 Shortness of breath: Secondary | ICD-10-CM | POA: Diagnosis not present

## 2012-04-27 DIAGNOSIS — R079 Chest pain, unspecified: Secondary | ICD-10-CM | POA: Diagnosis not present

## 2012-04-27 DIAGNOSIS — K219 Gastro-esophageal reflux disease without esophagitis: Secondary | ICD-10-CM | POA: Diagnosis not present

## 2012-04-27 DIAGNOSIS — R6889 Other general symptoms and signs: Secondary | ICD-10-CM | POA: Diagnosis not present

## 2012-04-28 DIAGNOSIS — R002 Palpitations: Secondary | ICD-10-CM | POA: Diagnosis not present

## 2012-05-19 DIAGNOSIS — E790 Hyperuricemia without signs of inflammatory arthritis and tophaceous disease: Secondary | ICD-10-CM

## 2012-05-19 HISTORY — DX: Hyperuricemia without signs of inflammatory arthritis and tophaceous disease: E79.0

## 2012-06-07 DIAGNOSIS — M79609 Pain in unspecified limb: Secondary | ICD-10-CM | POA: Diagnosis not present

## 2012-06-07 DIAGNOSIS — E782 Mixed hyperlipidemia: Secondary | ICD-10-CM | POA: Diagnosis not present

## 2012-06-07 DIAGNOSIS — E559 Vitamin D deficiency, unspecified: Secondary | ICD-10-CM | POA: Diagnosis not present

## 2012-06-07 DIAGNOSIS — I10 Essential (primary) hypertension: Secondary | ICD-10-CM | POA: Diagnosis not present

## 2012-06-07 DIAGNOSIS — R7309 Other abnormal glucose: Secondary | ICD-10-CM | POA: Diagnosis not present

## 2012-06-07 DIAGNOSIS — Z79899 Other long term (current) drug therapy: Secondary | ICD-10-CM | POA: Diagnosis not present

## 2012-07-26 DIAGNOSIS — N816 Rectocele: Secondary | ICD-10-CM | POA: Diagnosis not present

## 2012-07-26 DIAGNOSIS — N8182 Incompetence or weakening of pubocervical tissue: Secondary | ICD-10-CM | POA: Diagnosis not present

## 2012-07-26 DIAGNOSIS — Z124 Encounter for screening for malignant neoplasm of cervix: Secondary | ICD-10-CM | POA: Diagnosis not present

## 2012-07-26 DIAGNOSIS — K5900 Constipation, unspecified: Secondary | ICD-10-CM | POA: Diagnosis not present

## 2012-08-03 DIAGNOSIS — R079 Chest pain, unspecified: Secondary | ICD-10-CM | POA: Diagnosis not present

## 2012-08-03 DIAGNOSIS — E782 Mixed hyperlipidemia: Secondary | ICD-10-CM | POA: Diagnosis not present

## 2012-08-03 DIAGNOSIS — K219 Gastro-esophageal reflux disease without esophagitis: Secondary | ICD-10-CM | POA: Diagnosis not present

## 2012-08-09 DIAGNOSIS — M810 Age-related osteoporosis without current pathological fracture: Secondary | ICD-10-CM | POA: Diagnosis not present

## 2012-08-23 DIAGNOSIS — M810 Age-related osteoporosis without current pathological fracture: Secondary | ICD-10-CM | POA: Diagnosis not present

## 2012-08-23 DIAGNOSIS — M47817 Spondylosis without myelopathy or radiculopathy, lumbosacral region: Secondary | ICD-10-CM | POA: Diagnosis not present

## 2012-08-23 DIAGNOSIS — I881 Chronic lymphadenitis, except mesenteric: Secondary | ICD-10-CM | POA: Diagnosis not present

## 2012-08-23 DIAGNOSIS — R609 Edema, unspecified: Secondary | ICD-10-CM | POA: Diagnosis not present

## 2012-08-23 DIAGNOSIS — M109 Gout, unspecified: Secondary | ICD-10-CM | POA: Diagnosis not present

## 2012-08-23 DIAGNOSIS — Z79899 Other long term (current) drug therapy: Secondary | ICD-10-CM | POA: Diagnosis not present

## 2012-08-23 DIAGNOSIS — D7589 Other specified diseases of blood and blood-forming organs: Secondary | ICD-10-CM | POA: Diagnosis not present

## 2012-08-23 DIAGNOSIS — M47814 Spondylosis without myelopathy or radiculopathy, thoracic region: Secondary | ICD-10-CM | POA: Diagnosis not present

## 2012-08-23 DIAGNOSIS — R252 Cramp and spasm: Secondary | ICD-10-CM | POA: Diagnosis not present

## 2012-08-23 DIAGNOSIS — E559 Vitamin D deficiency, unspecified: Secondary | ICD-10-CM | POA: Diagnosis not present

## 2012-08-23 DIAGNOSIS — M949 Disorder of cartilage, unspecified: Secondary | ICD-10-CM | POA: Diagnosis not present

## 2012-09-27 ENCOUNTER — Ambulatory Visit (HOSPITAL_COMMUNITY): Admission: RE | Admit: 2012-09-27 | Payer: Medicare Other | Source: Ambulatory Visit

## 2012-09-27 ENCOUNTER — Other Ambulatory Visit (HOSPITAL_COMMUNITY): Payer: Self-pay | Admitting: Internal Medicine

## 2012-09-28 ENCOUNTER — Encounter (HOSPITAL_COMMUNITY): Payer: Self-pay

## 2012-09-28 ENCOUNTER — Ambulatory Visit (HOSPITAL_COMMUNITY)
Admission: RE | Admit: 2012-09-28 | Discharge: 2012-09-28 | Disposition: A | Discharge status: Home / Self Care | Payer: Medicare Other | Source: Ambulatory Visit | Attending: Internal Medicine | Admitting: Internal Medicine

## 2012-09-28 DIAGNOSIS — M810 Age-related osteoporosis without current pathological fracture: Secondary | ICD-10-CM | POA: Diagnosis not present

## 2012-09-28 HISTORY — DX: Gastro-esophageal reflux disease without esophagitis: K21.9

## 2012-09-28 MED ORDER — SODIUM CHLORIDE 0.9 % IV SOLN
Freq: Once | INTRAVENOUS | Status: AC
  Administered 2012-09-28: 11:00:00 via INTRAVENOUS

## 2012-09-28 MED ORDER — IBANDRONATE SODIUM 3 MG/3ML IV SOLN: 3.0000 mg | Freq: Once | INTRAVENOUS | Status: DC

## 2012-09-28 MED ORDER — IBANDRONATE SODIUM 3 MG/3ML IV SOLN
3.0000 mg | Freq: Once | INTRAVENOUS | Status: AC
  Administered 2012-09-28: 3 mg via INTRAVENOUS
  Filled 2012-09-28: qty 3

## 2012-09-28 NOTE — Progress Notes (Signed)
Boniva given, pt ambulatory to exit at d/c with husband.  Pt d/c at 1150.

## 2012-12-20 ENCOUNTER — Encounter: Payer: Self-pay | Admitting: Gastroenterology

## 2013-01-06 DIAGNOSIS — E559 Vitamin D deficiency, unspecified: Secondary | ICD-10-CM | POA: Diagnosis not present

## 2013-01-06 DIAGNOSIS — M810 Age-related osteoporosis without current pathological fracture: Secondary | ICD-10-CM | POA: Diagnosis not present

## 2013-01-06 DIAGNOSIS — Z79899 Other long term (current) drug therapy: Secondary | ICD-10-CM | POA: Diagnosis not present

## 2013-02-01 ENCOUNTER — Ambulatory Visit (HOSPITAL_COMMUNITY): Admission: RE | Admit: 2013-02-01 | Payer: Medicare Other | Source: Ambulatory Visit

## 2013-02-01 ENCOUNTER — Other Ambulatory Visit (HOSPITAL_COMMUNITY): Payer: Self-pay | Admitting: Internal Medicine

## 2013-02-04 ENCOUNTER — Encounter (HOSPITAL_COMMUNITY): Payer: Self-pay

## 2013-02-04 ENCOUNTER — Ambulatory Visit (HOSPITAL_COMMUNITY): Admission: RE | Admit: 2013-02-04 | Payer: Medicare Other | Source: Ambulatory Visit

## 2013-02-04 ENCOUNTER — Ambulatory Visit (HOSPITAL_COMMUNITY)
Admission: RE | Admit: 2013-02-04 | Discharge: 2013-02-04 | Disposition: A | Discharge status: Home / Self Care | Payer: Medicare Other | Source: Ambulatory Visit | Attending: Internal Medicine | Admitting: Internal Medicine

## 2013-02-04 DIAGNOSIS — M810 Age-related osteoporosis without current pathological fracture: Secondary | ICD-10-CM | POA: Diagnosis not present

## 2013-02-04 MED ORDER — SODIUM CHLORIDE 0.9 % IV SOLN
Freq: Once | INTRAVENOUS | Status: AC
  Administered 2013-02-04: 14:00:00 via INTRAVENOUS

## 2013-02-04 MED ORDER — ZOLEDRONIC ACID 5 MG/100ML IV SOLN
5.0000 mg | Freq: Once | INTRAVENOUS | Status: AC
  Administered 2013-02-04: 5 mg via INTRAVENOUS
  Filled 2013-02-04: qty 100

## 2013-02-17 DIAGNOSIS — Z23 Encounter for immunization: Secondary | ICD-10-CM | POA: Diagnosis not present

## 2013-02-22 DIAGNOSIS — M810 Age-related osteoporosis without current pathological fracture: Secondary | ICD-10-CM | POA: Diagnosis not present

## 2013-02-22 DIAGNOSIS — M1990 Unspecified osteoarthritis, unspecified site: Secondary | ICD-10-CM | POA: Diagnosis not present

## 2013-02-22 DIAGNOSIS — R252 Cramp and spasm: Secondary | ICD-10-CM | POA: Diagnosis not present

## 2013-02-22 DIAGNOSIS — Z1331 Encounter for screening for depression: Secondary | ICD-10-CM | POA: Diagnosis not present

## 2013-02-22 DIAGNOSIS — Z6827 Body mass index (BMI) 27.0-27.9, adult: Secondary | ICD-10-CM | POA: Diagnosis not present

## 2013-02-24 ENCOUNTER — Ambulatory Visit: Payer: Medicare Other | Admitting: Cardiovascular Disease

## 2013-04-01 ENCOUNTER — Encounter: Payer: Self-pay | Admitting: Gastroenterology

## 2013-04-05 ENCOUNTER — Encounter: Payer: Self-pay | Admitting: Gastroenterology

## 2013-04-05 ENCOUNTER — Ambulatory Visit (INDEPENDENT_AMBULATORY_CARE_PROVIDER_SITE_OTHER): Payer: Medicare Other | Admitting: Gastroenterology

## 2013-04-05 VITALS — BP 132/78 | HR 76 | Ht 61.5 in | Wt 149.5 lb

## 2013-04-05 DIAGNOSIS — K219 Gastro-esophageal reflux disease without esophagitis: Secondary | ICD-10-CM

## 2013-04-05 DIAGNOSIS — Z8371 Family history of colonic polyps: Secondary | ICD-10-CM | POA: Diagnosis not present

## 2013-04-05 MED ORDER — ESOMEPRAZOLE MAGNESIUM 40 MG PO CPDR: 40.0000 mg | DELAYED_RELEASE_CAPSULE | Freq: Two times a day (BID) | ORAL | Status: DC

## 2013-04-05 MED ORDER — PEG-KCL-NACL-NASULF-NA ASC-C 100 G PO SOLR: 1.0000 | Freq: Once | ORAL | Status: DC

## 2013-04-05 NOTE — Patient Instructions (Signed)
You have been scheduled for an endoscopy and colonoscopy with propofol. Please follow the written instructions given to you at your visit today. Please pick up your prep at the pharmacy within the next 1-3 days. If you use inhalers (even only as needed), please bring them with you on the day of your procedure. Your physician has requested that you go to www.startemmi.com and enter the access code given to you at your visit today. This web site gives a general overview about your procedure. However, you should still follow specific instructions given to you by our office regarding your preparation for the procedure.  Discontinue pantoprazole.  We have sent the following medications to your pharmacy for you to pick up at your convenience: Nexium to take one tablet by mouth twice daily.   Patient advised to avoid spicy, acidic, citrus, chocolate, mints, fruit and fruit juices.  Limit the intake of caffeine, alcohol and Soda.  Don't exercise too soon after eating.  Don't lie down within 3-4 hours of eating.  Elevate the head of your bed.  Thank you for choosing me and [facility].  Venita Lick. Pleas Koch., MD., Clementeen Graham  cc: Rodrigo Ran, MD

## 2013-04-05 NOTE — Progress Notes (Signed)
    History of Present Illness: This is a 72 year old female with a history of GERD. She previously underwent evaluation with an esophageal manometry and 24-hour pH probe in 2006. She had been maintained on Nexium 40 mg twice daily for GERD with LPR. Over the past several years she's been maintained on Nexium 40 mg daily and the past few months she's noted frequent esophageal burning with swallowing and following meals. She was changed from Nexium to pantoprazole 20 mg daily and she feels this has not been as effective. Denies weight loss, abdominal pain, constipation, diarrhea, change in stool caliber, melena, hematochezia, nausea, vomiting, chest pain.  Review of Systems: Pertinent positive and negative review of systems were noted in the above HPI section. All other review of systems were otherwise negative.  Current Medications, Allergies, Past Medical History, Past Surgical History, Family History and Social History were reviewed in [employer] record.  Physical Exam: General: Well developed , well nourished, no acute distress Head: Normocephalic and atraumatic Eyes:  sclerae anicteric, EOMI Ears: Normal auditory acuity Mouth: No deformity or lesions Neck: Supple, no masses or thyromegaly Lungs: Clear throughout to auscultation Heart: Regular rate and rhythm; no murmurs, rubs or bruits Abdomen: Soft, non tender and non distended. No masses, hepatosplenomegaly or hernias noted. Normal Bowel sounds Rectal: Deferred to colonoscopy Musculoskeletal: Symmetrical with no gross deformities  Skin: No lesions on visible extremities Pulses:  Normal pulses noted Extremities: No clubbing, cyanosis, edema or deformities noted Neurological: Alert oriented x 4, grossly nonfocal Cervical Nodes:  No significant cervical adenopathy Inguinal Nodes: No significant inguinal adenopathy Psychological:  Alert and cooperative. Normal mood and affect  Assessment and  Recommendations:  1. GERD with burning on swallowing. Suspected reflux related symptoms. Rule out esophagitis and strictures. Typically follow antireflux measures. Change to Nexium 40 mg by mouth twice a day. Schedule upper endoscopy. The risks, benefits, and alternatives to endoscopy with possible biopsy and possible dilation were discussed with the patient and they consent to proceed.   2. Family history of colon polyps in 2 brothers. Elevated risk colorectal cancer screening. Schedule a five-year interval screening colonoscopy. The risks, benefits, and alternatives to colonoscopy with possible biopsy and possible polypectomy were discussed with the patient and they consent to proceed.

## 2013-04-21 ENCOUNTER — Encounter: Payer: Self-pay | Admitting: *Deleted

## 2013-04-22 ENCOUNTER — Encounter: Payer: Self-pay | Admitting: Cardiovascular Disease

## 2013-04-22 ENCOUNTER — Ambulatory Visit (INDEPENDENT_AMBULATORY_CARE_PROVIDER_SITE_OTHER): Payer: Medicare Other | Admitting: Cardiovascular Disease

## 2013-04-22 VITALS — BP 140/80 | HR 80 | Ht 61.5 in | Wt 149.6 lb

## 2013-04-22 DIAGNOSIS — R600 Localized edema: Secondary | ICD-10-CM

## 2013-04-22 DIAGNOSIS — R0789 Other chest pain: Secondary | ICD-10-CM | POA: Insufficient documentation

## 2013-04-22 DIAGNOSIS — R609 Edema, unspecified: Secondary | ICD-10-CM

## 2013-04-22 DIAGNOSIS — E790 Hyperuricemia without signs of inflammatory arthritis and tophaceous disease: Secondary | ICD-10-CM | POA: Insufficient documentation

## 2013-04-22 DIAGNOSIS — R7989 Other specified abnormal findings of blood chemistry: Secondary | ICD-10-CM | POA: Diagnosis not present

## 2013-04-22 DIAGNOSIS — K219 Gastro-esophageal reflux disease without esophagitis: Secondary | ICD-10-CM

## 2013-04-22 DIAGNOSIS — E785 Hyperlipidemia, unspecified: Secondary | ICD-10-CM

## 2013-04-22 NOTE — Progress Notes (Signed)
Patient ID: Angela Preston, female   DOB: 12/09/1940, 72 y.o.   MRN: [id number]     PATIENT PROFILE:  Angela Preston is a 72 year old white female who is a former patient of Dr. Susa Griffins. She now presents to establish cardiology care with me.   HPI: Angela Preston states that in the past she has experienced several episodes of chest pain. She recalls approximately 20 years ago she underwent initial cardiac catheterization by Dr. Elsie Lincoln. In over 10 years ago she apparently underwent another catheterization which at that time was done by Dr. Jenne Campus. She initially saw Dr. Alanda Amass one year ago. She has a history of hyperuricemia which is one area. All but the denies any recent episodes of gout. She also has a history of GERD. She tells me she will be undergoing an endoscopy and a colonoscopy in January by Dr. Russella Dar. She does have a history of hyperlipidemia but developed myalgias secondary to pravastatin Lipitor and Crestor. In the past she has experienced episodes of lower summoning edema for which she has taken Maxzide. She now presents to the office today to establish Cardiologic care with me. She denies recent episodes of chest tightness. She denies palpitations. She denies presyncope or syncope. She denies any exertional shortness of breath.  Past Medical History  Diagnosis Date  . Hypertension   . Osteoporosis   . Hyperlipidemia   . GERD (gastroesophageal reflux disease)   . Internal hemorrhoids   . Diverticulosis   . Status post dilation of esophageal narrowing   . Vitamin D deficiency   . Hyperuricemia   . History of nuclear stress test 03/2012    bruce myoview; normal pattern of perfusion in all regions, post-stress EF 76%, no ECG changes; low risk scan     Past Surgical History  Procedure Laterality Date  . Total hip arthroplasty Left   . Abdominal hysterectomy    . Umbilical hernia repair    . Lymph nodes removed  1990's    due to cat scratch fever  . Cardiac  catheterization  1993    normal coronaries  . Cardiac catheterization  2003    normal coronaries  . Transthoracic echocardiogram  04/12/2012    EF 55-65%, grade 1 diastolic dysfunction; mild MR; normal PA pressure     No Known Allergies  Current Outpatient Prescriptions  Medication Sig Dispense Refill  . allopurinol (ZYLOPRIM) 300 MG tablet Take 300 mg by mouth daily.      Marland Kitchen aspirin 81 MG tablet Take 81 mg by mouth daily.      Marland Kitchen buPROPion (WELLBUTRIN SR) 150 MG 12 hr tablet Take 150 mg by mouth daily.       . calcium-vitamin D (OSCAL WITH D) 500-200 MG-UNIT per tablet Take 1 tablet by mouth daily.      Marland Kitchen esomeprazole (NEXIUM) 40 MG capsule Take 1 capsule (40 mg total) by mouth 2 (two) times daily before a meal.  60 capsule  11  . Multiple Vitamins-Minerals (MULTIVITAMIN WITH MINERALS) tablet Take 1 tablet by mouth daily.      Marland Kitchen triamterene-hydrochlorothiazide (MAXZIDE) 75-50 MG per tablet Take 1 tablet by mouth daily.      . vitamin B-12 (CYANOCOBALAMIN) 1000 MCG tablet Take 1,000 mcg by mouth 2 (two) times daily.      . Vitamin D, Ergocalciferol, (DRISDOL) 50000 UNITS CAPS Take 50,000 Units by mouth. Five times a week       No current facility-administered medications for this visit.  Socially she is married. She lives with her husband in a [address] and they have 18 cows. There is no tobacco use. There is no alcohol use. She has 4 children 6 grandchildren and 2 great-grandchildren.  Family History  Problem Relation Age of Onset  . Colon polyps Brother     x 2  . Heart attack Father   . Dementia Mother   . Stroke Mother   . Heart disease Maternal Grandmother   . Kidney disease Paternal Grandfather   . Heart Problems Brother     ROS is negative for fever chills or night sweats. She denies visual changes. She denies skin lesions. She denies change in hearing. She is unaware of lymphadenopathy. She denies wheezing. She denies increased cough or congestion. She denies  exertion precipitated chest pain. There is no presyncope or syncope. She status post hysterectomy and left hip surgery. She denies nausea or vomiting. She does have GERD. She will be undergoing colonoscopy and endoscopy by Dr. Russella Dar. She is unaware of blood in her stool or urine. She presently denies myalgias but apparently did not tolerate statin therapy. She denies claudication. She denies tremors. There is no diabetes. There is no cold or heat intolerance. She denies difficulty with sleep . Other comprehensive 14 point system review is negative.  PE BP 140/80  Pulse 80  Ht 5' 1.5" (1.562 m)  Wt 67.858 kg (149 lb 9.6 oz)  BMI 27.81 kg/m2 General: Alert, oriented, no distress.  Skin: normal turgor, no rashes HEENT: Normocephalic, atraumatic. Pupils round and reactive; sclera anicteric; Fundi no arteriolar narrowing hemorrhages or exudates the Nose without nasal septal hypertrophy Mouth/Parynx benign; Mallinpatti scale 2 Neck: No JVD, no carotid bruits Lungs: clear to ausculatation and percussion; no wheezing or rales Heart: RRR, s1 s2 normal 1/6 systolic murmur left sternal border. Abdomen: soft, nontender; no hepatosplenomehaly, BS+; abdominal aorta nontender and not dilated by palpation. Pulses 2+ Extremities: no clubbinbg cyanosis or edema, Homan's sign negative  Neurologic: grossly nonfocal Psychologic: Normal mood and affect    ECG: Sinus rhythm at 80 beats per minute. One isolated PVC. QTc interval 465 ms.  LABS:  BMET    Component Value Date/Time   NA 136 04/29/2007 0430   K 3.4* 04/29/2007 0430   CL 104 04/29/2007 0430   CO2 26 04/29/2007 0430   GLUCOSE 130* 04/29/2007 0430   BUN 9 04/29/2007 0430   CREATININE 0.89 04/29/2007 0430   CALCIUM 7.9* 04/29/2007 0430   GFRNONAA >60 04/29/2007 0430   GFRAA  Value: >60        The eGFR has been calculated using the MDRD equation. This calculation has not been validated in all clinical 04/29/2007 0430     Hepatic Function  Panel  No results found for this basename: prot, albumin, ast, alt, alkphos, bilitot, bilidir, ibili     CBC    Component Value Date/Time   WBC 10.4 04/29/2007 0430   RBC 2.92* 04/29/2007 0430   HGB 9.5* 04/29/2007 0430   HCT 27.2* 04/29/2007 0430   PLT 188 04/29/2007 0430   MCV 93.1 04/29/2007 0430   MCHC 34.8 04/29/2007 0430   RDW 12.6 04/29/2007 0430   LYMPHSABS 1.7 04/25/2007 2125   MONOABS 0.9 04/25/2007 2125   EOSABS 0.2 04/25/2007 2125   BASOSABS 0.0 04/25/2007 2125     BNP No results found for this basename: probnp    Lipid Panel  No results found for this basename: chol, trig, hdl, cholhdl, vldl, ldlcalc  RADIOLOGY: No results found.   ASSESSMENT AND PLAN: My impression is that Angela Preston is a 72 year old female who has had several episodes of chest pain in the past and apparently has undergone 2 prior cardiac catheterizations and was told of having normal coronary arteries. She did undergo a nuclear perfusion study in November 2013 which did not reveal scar or ischemia. Resume, her blood pressure is well-controlled on current therapy. She does not have signs of edema. She's not having any significant arrhythmias although was noted to have an isolated PVC. I believe she is stable from a cardiovascular standpoint. I recommended she continue her current therapy procedure a 1 year for followup evaluation.   Lennette Bihari, MD, [facility] 04/22/2013 6:58 PM

## 2013-04-22 NOTE — Patient Instructions (Signed)
Your physician recommends that you schedule a follow-up appointment in: 1 year. No changes were made today in your therapy. 

## 2013-04-28 ENCOUNTER — Ambulatory Visit: Payer: Medicare Other | Admitting: Gastroenterology

## 2013-05-02 ENCOUNTER — Other Ambulatory Visit: Payer: Self-pay | Admitting: Internal Medicine

## 2013-06-14 ENCOUNTER — Encounter: Payer: Self-pay | Admitting: Gastroenterology

## 2013-06-14 ENCOUNTER — Ambulatory Visit (AMBULATORY_SURGERY_CENTER): Payer: Medicare Other | Admitting: Gastroenterology

## 2013-06-14 VITALS — BP 146/75 | HR 62 | Temp 96.7°F | Resp 15 | Ht 61.5 in | Wt 149.0 lb

## 2013-06-14 DIAGNOSIS — D131 Benign neoplasm of stomach: Secondary | ICD-10-CM

## 2013-06-14 DIAGNOSIS — K317 Polyp of stomach and duodenum: Secondary | ICD-10-CM

## 2013-06-14 DIAGNOSIS — K219 Gastro-esophageal reflux disease without esophagitis: Secondary | ICD-10-CM

## 2013-06-14 DIAGNOSIS — D126 Benign neoplasm of colon, unspecified: Secondary | ICD-10-CM | POA: Diagnosis not present

## 2013-06-14 DIAGNOSIS — Z8371 Family history of colonic polyps: Secondary | ICD-10-CM | POA: Diagnosis not present

## 2013-06-14 DIAGNOSIS — Z1211 Encounter for screening for malignant neoplasm of colon: Secondary | ICD-10-CM | POA: Diagnosis not present

## 2013-06-14 DIAGNOSIS — Z86010 Personal history of colonic polyps: Secondary | ICD-10-CM | POA: Diagnosis not present

## 2013-06-14 DIAGNOSIS — I10 Essential (primary) hypertension: Secondary | ICD-10-CM | POA: Diagnosis not present

## 2013-06-14 DIAGNOSIS — Z83719 Family history of colon polyps, unspecified: Secondary | ICD-10-CM

## 2013-06-14 MED ORDER — SODIUM CHLORIDE 0.9 % IV SOLN: 500.0000 mL | INTRAVENOUS | Status: DC

## 2013-06-14 NOTE — Progress Notes (Signed)
No egg or soy allergy. ewm No problems with past sedation. ewm

## 2013-06-14 NOTE — Progress Notes (Signed)
Called to room to assist during endoscopic procedure.  Patient ID and intended procedure confirmed with present staff. Received instructions for my participation in the procedure from the performing physician.  

## 2013-06-14 NOTE — Op Note (Signed)
[place]  [address] [place], [address]   ENDOSCOPY PROCEDURE REPORT  PATIENT: Angela Preston, Angela Preston  MR#: [id number] BIRTHDATE: November 10, 1940 , 39  yrs. old GENDER: Female ENDOSCOPIST: Ladene Artist, MD, [facility] PROCEDURE DATE:  06/14/2013 PROCEDURE:  EGD w/ biopsy ASA CLASS:     Class II INDICATIONS:  History of esophageal reflux. MEDICATIONS: There was residual sedation effect present from prior procedure, MAC sedation, administered by CRNA, and propofol (Diprivan) 150mg  IV TOPICAL ANESTHETIC: Cetacaine Spray DESCRIPTION OF PROCEDURE: After the risks benefits and alternatives of the procedure were thoroughly explained, informed consent was obtained.  The LB [id number] endoscope was introduced through the mouth and advanced to the second portion of the duodenum. Without limitations.  The instrument was slowly withdrawn as the mucosa was fully examined.  ESOPHAGUS: The mucosa of the esophagus appeared normal. STOMACH: Multiple sessile polyps ranging between 3-77mm in size were found in the gastric body and gastric fundus, typical appearance of benign fundic gland polyps.  Multiple biopsies was performed of 4 of the larger polyps.  The stomach otherwise appeared normal. DUODENUM: The duodenal mucosa showed no abnormalities in the bulb and second portion of the duodenum.  Retroflexed views revealed a hiatal hernia.   The scope was then withdrawn from the patient and the procedure completed.  COMPLICATIONS: There were no complications.  ENDOSCOPIC IMPRESSION: 1.   Small hiatal hernia 2.   Multiple sessile polyps ranging between 3-47mm in the gastric body and gastric fundus  RECOMMENDATIONS: 1.  Await pathology results 2.  Anti-reflux regimen 3.  Continue PPI  eSigned:  Ladene Artist, MD, [facility] 06/14/2013 2:24 PM   BO:FBPZ Joylene Draft, MD

## 2013-06-14 NOTE — Patient Instructions (Addendum)
YOU HAD AN ENDOSCOPIC PROCEDURE TODAY AT THE [facility]: Refer to the procedure report that was given to you for any specific questions about what was found during the examination.  If the procedure report does not answer your questions, please call your gastroenterologist to clarify.  If you requested that your care partner not be given the details of your procedure findings, then the procedure report has been included in a sealed envelope for you to review at your convenience later.  YOU SHOULD EXPECT: Some feelings of bloating in the abdomen. Passage of more gas than usual.  Walking can help get rid of the air that was put into your GI tract during the procedure and reduce the bloating. If you had a lower endoscopy (such as a colonoscopy or flexible sigmoidoscopy) you may notice spotting of blood in your stool or on the toilet paper. If you underwent a bowel prep for your procedure, then you may not have a normal bowel movement for a few days.  DIET: Your first meal following the procedure should be a light meal and then it is ok to progress to your normal diet.  A half-sandwich or bowl of soup is an example of a good first meal.  Heavy or fried foods are harder to digest and may make you feel nauseous or bloated.  Likewise meals heavy in dairy and vegetables can cause extra gas to form and this can also increase the bloating.  Drink plenty of fluids but you should avoid alcoholic beverages for 24 hours.  ACTIVITY: Your care partner should take you home directly after the procedure.  You should plan to take it easy, moving slowly for the rest of the day.  You can resume normal activity the day after the procedure however you should NOT DRIVE or use heavy machinery for 24 hours (because of the sedation medicines used during the test).    SYMPTOMS TO REPORT IMMEDIATELY: A gastroenterologist can be reached at any hour.  During normal business hours, 8:30 AM to 5:00 PM Monday through Friday,  call [phone].  After hours and on weekends, please call the GI answering service at [phone] who will take a message and have the physician on call contact you.   Following lower endoscopy (colonoscopy or flexible sigmoidoscopy):  Excessive amounts of blood in the stool  Significant tenderness or worsening of abdominal pains  Swelling of the abdomen that is new, acute  Fever of 100F or higher  Following upper endoscopy (EGD)  Vomiting of blood or coffee ground material  New chest pain or pain under the shoulder blades  Painful or persistently difficult swallowing  New shortness of breath  Fever of 100F or higher  Black, tarry-looking stools  FOLLOW UP: If any biopsies were taken you will be contacted by phone or by letter within the next 1-3 weeks.  Call your gastroenterologist if you have not heard about the biopsies in 3 weeks.  Our staff will call the home number listed on your records the next business day following your procedure to check on you and address any questions or concerns that you may have at that time regarding the information given to you following your procedure. This is a courtesy call and so if there is no answer at the home number and we have not heard from you through the emergency physician on call, we will assume that you have returned to your regular daily activities without incident.  SIGNATURES/CONFIDENTIALITY: You and/or your care   partner have signed paperwork which will be entered into your electronic medical record.  These signatures attest to the fact that that the information above on your After Visit Summary has been reviewed and is understood.  Full responsibility of the confidentiality of this discharge information lies with you and/or your care-partner.  Hiatal hernia, anti-reflux regimen, Polyps, diverticulosis, hemorrhoids, High fiber diet-handouts given  Hold aspirin, aspirin products, and anti-inflammatory medications (ibuprofen,  motrin, advil, aleve, naproxen, etc) for 2 weeks.  Repeat colonoscopy will be determined by pathology.  Continue nexium

## 2013-06-14 NOTE — Op Note (Signed)
[place]  [address]. [place], [address]   COLONOSCOPY PROCEDURE REPORT PATIENT: Angela Preston, Angela Preston  MR#: [id number] BIRTHDATE: [birth year] , 50  yrs. old GENDER: Female ENDOSCOPIST: Ladene Artist, MD, [facility] PROCEDURE DATE:  06/14/2013 PROCEDURE:   Colonoscopy with biopsy and snare polypectomy First Screening Colonoscopy - Avg.  risk and is 50 yrs.  old or older - No.  Prior Negative Screening - Now for repeat screening. Above average risk  History of Adenoma - Now for follow-up colonoscopy & has been > or = to 3 yrs.  N/A  Polyps Removed Today? Yes. ASA CLASS:   Class II INDICATIONS:elevated risk screening-patient's family history of colon polyps: 2 brothers with colon polyps MEDICATIONS: MAC sedation, administered by CRNA and propofol (Diprivan) 300mg  IV DESCRIPTION OF PROCEDURE:   After the risks benefits and alternatives of the procedure were thoroughly explained, informed consent was obtained.  A digital rectal exam revealed no abnormalities of the rectum.   The LB [id number]  endoscope was introduced through the anus and advanced to the cecum, which was identified by both the appendix and ileocecal valve. No adverse events experienced.   The quality of the prep was good, using MoviPrep  The instrument was then slowly withdrawn as the colon was fully examined.  COLON FINDINGS: A sessile polyp measuring 1 cm in size was found in the proximal transverse colon.  A piecemeal polypectomy was performed using snare cautery.  The resection was complete and the polyp tissue was completely retrieved.   Three sessile polyps measuring 4-6 mm in size were found in the distal transverse colon. A polypectomy was performed with a cold snare and with cold forceps.  The resection was complete and the polyp tissue was completely retrieved.   Mild diverticulosis was noted in the ascending colon, transverse colon, and sigmoid colon.   The colon was otherwise normal.   There was no diverticulosis, inflammation, polyps or cancers unless previously stated.  Retroflexed views revealed internal hemorrhoids. The time to cecum=3 minutes 16 seconds.  Withdrawal time=16 minutes 37 seconds.  The scope was withdrawn and the procedure completed. COMPLICATIONS: There were no complications. ENDOSCOPIC IMPRESSION: 1.   Sessile polyp measuring 1 cm in the proximal transverse colon; piecemeal polypectomy performed using snare cautery 2.   Three sessile polyps measuring 4-6 mm in the distal transverse colon; polypectomy performed with a cold snare and with cold forceps 3.   Mild diverticulosis in the ascending colon, transverse colon, and sigmoid colon 4.   Moderate internal hemorrhoids RECOMMENDATIONS: 1.  Hold aspirin, aspirin products, and anti-inflammatory medication for 2 weeks. 2.  Await pathology results 3.  Repeat colonoscopy in 1 years if larger polyp removed piecemeal adenomatous; otherwise 5 years 4.  High fiber diet with liberal fluid intake. eSigned:  Ladene Artist, MD, [facility] 06/14/2013 2:18 PM   cc: Crist Infante, MD

## 2013-06-15 ENCOUNTER — Telehealth: Payer: Self-pay | Admitting: *Deleted

## 2013-06-15 NOTE — Telephone Encounter (Signed)
  Follow up Call-  Call back number 06/14/2013  Post procedure Call Back phone  # [phone]  Permission to leave phone message Yes     Patient questions:  Do you have a fever, pain , or abdominal swelling? no Pain Score  0 *  Have you tolerated food without any problems? yes  Have you been able to return to your normal activities? yes  Do you have any questions about your discharge instructions: Diet   no Medications  no Follow up visit  no  Do you have questions or concerns about your Care? no  Actions: * If pain score is 4 or above: No action needed, pain <4.  Pt. Stated that we were all nice and she was impressed with all of [place].

## 2013-06-27 ENCOUNTER — Encounter: Payer: Self-pay | Admitting: Gastroenterology

## 2013-08-08 ENCOUNTER — Telehealth: Payer: Self-pay | Admitting: Gastroenterology

## 2013-08-08 NOTE — Telephone Encounter (Signed)
Patient reports abdominal pain for the last several weeks and would like to see Dr. Fuller Plan she is scheduled for 08/10/13 9:15

## 2013-08-10 ENCOUNTER — Ambulatory Visit (INDEPENDENT_AMBULATORY_CARE_PROVIDER_SITE_OTHER): Payer: Medicare Other | Admitting: Gastroenterology

## 2013-08-10 ENCOUNTER — Other Ambulatory Visit (INDEPENDENT_AMBULATORY_CARE_PROVIDER_SITE_OTHER): Payer: Medicare Other

## 2013-08-10 ENCOUNTER — Encounter: Payer: Self-pay | Admitting: Gastroenterology

## 2013-08-10 VITALS — BP 138/80 | HR 76 | Ht 61.5 in | Wt 145.5 lb

## 2013-08-10 DIAGNOSIS — R1031 Right lower quadrant pain: Secondary | ICD-10-CM

## 2013-08-10 DIAGNOSIS — K219 Gastro-esophageal reflux disease without esophagitis: Secondary | ICD-10-CM

## 2013-08-10 DIAGNOSIS — R292 Abnormal reflex: Secondary | ICD-10-CM

## 2013-08-10 LAB — BASIC METABOLIC PANEL
BUN: 13 mg/dL (ref 6–23)
CHLORIDE: 99 meq/L (ref 96–112)
CO2: 31 mEq/L (ref 19–32)
Calcium: 10 mg/dL (ref 8.4–10.5)
Creatinine, Ser: 0.8 mg/dL (ref 0.4–1.2)
GFR: 70.42 mL/min (ref >60.00)
Glucose, Bld: 110 mg/dL — ABNORMAL HIGH (ref 70–99)
POTASSIUM: 4.2 meq/L (ref 3.5–5.1)
SODIUM: 138 meq/L (ref 135–145)

## 2013-08-10 LAB — CBC WITH DIFFERENTIAL/PLATELET
BASOS ABS: 0 10*3/uL (ref 0.0–0.1)
Basophils Relative: 0.4 % (ref 0.0–3.0)
Eosinophils Absolute: 0.5 10*3/uL (ref 0.0–0.7)
Eosinophils Relative: 6.8 % — ABNORMAL HIGH (ref 0.0–5.0)
HCT: 42 % (ref 36.0–46.0)
Hemoglobin: 14.2 g/dL (ref 12.0–15.0)
LYMPHS PCT: 35.8 % (ref 12.0–46.0)
Lymphs Abs: 2.4 10*3/uL (ref 0.7–4.0)
MCHC: 33.7 g/dL (ref 30.0–36.0)
MCV: 93.1 fl (ref 78.0–100.0)
Monocytes Absolute: 0.6 10*3/uL (ref 0.1–1.0)
Monocytes Relative: 8.7 % (ref 3.0–12.0)
NEUTROS ABS: 3.2 10*3/uL (ref 1.4–7.7)
Neutrophils Relative %: 48.3 % (ref 43.0–77.0)
PLATELETS: 317 10*3/uL (ref 150.0–400.0)
RBC: 4.51 Mil/uL (ref 3.87–5.11)
RDW: 13.2 % (ref 11.5–14.6)
WBC: 6.7 10*3/uL (ref 4.5–10.5)

## 2013-08-10 LAB — HEPATIC FUNCTION PANEL
ALBUMIN: 4.4 g/dL (ref 3.5–5.2)
ALT: 18 U/L (ref 0–35)
AST: 19 U/L (ref 0–37)
Alkaline Phosphatase: 62 U/L (ref 39–117)
BILIRUBIN DIRECT: 0.1 mg/dL (ref 0.0–0.3)
Total Bilirubin: 0.9 mg/dL (ref 0.3–1.2)
Total Protein: 7.7 g/dL (ref 6.0–8.3)

## 2013-08-10 LAB — TSH: TSH: 2.28 u[IU]/mL (ref 0.35–5.50)

## 2013-08-10 MED ORDER — GLYCOPYRROLATE 1 MG PO TABS: 1.0000 mg | ORAL_TABLET | Freq: Two times a day (BID) | ORAL | Status: DC

## 2013-08-10 NOTE — Progress Notes (Signed)
    History of Present Illness: This is a 73 year old female who relates intermittent brief crampy right lower part of abdominal pain for the past several weeks. She has no other gastrointestinal complaints. She underwent EGD and colonoscopy in January.  Current Medications, Allergies, Past Medical History, Past Surgical History, Family History and Social History were reviewed in [employer] record.  Physical Exam: General: Well developed , well nourished, no acute distress Head: Normocephalic and atraumatic Eyes:  sclerae anicteric, EOMI Ears: Normal auditory acuity Mouth: No deformity or lesions Lungs: Clear throughout to auscultation Heart: Regular rate and rhythm; no murmurs, rubs or bruits Abdomen: Soft, minimal suprapubic tenderness to deep palpation, nondistended. No masses, hepatosplenomegaly or hernias noted. Normal Bowel sounds Musculoskeletal: Symmetrical with no gross deformities  Pulses:  Normal pulses noted Extremities: No clubbing, cyanosis, edema or deformities noted Neurological: Alert oriented x 4, grossly nonfocal Psychological:  Alert and cooperative. anxious.   Assessment and Recommendations:  1.  Intermittent, crampy RLQ pain. Suspected musculoskeletal pain versus bowel spasms. Rule out other disorders. Obtain standard blood work. Schedule abdominal/pelvic CT. Trial of glycopyrrolate 1 mg twice a day.  2. GERD. Continue Nexium 40 mg twice a day and standard antireflux measures.  3. Personal history of adenomatous colon polyps. One polyp removed by piecemeal technique in January. Repeat colonoscopy recommended January 2016.

## 2013-08-10 NOTE — Patient Instructions (Signed)
Your physician has requested that you go to the basement for the following lab work before leaving today:[place] health panel.  We have sent the following medications to your pharmacy for you to pick up at your convenience: Robinul.  You have been scheduled for a CT scan of the abdomen and pelvis at [facility] ([address]---this is in the same building as [facility]).   You are scheduled on 08/12/13 at 11:30am. You should arrive 15 minutes prior to your appointment time for registration. Please follow the written instructions below on the day of your exam:  WARNING: IF YOU ARE ALLERGIC TO IODINE/X-RAY DYE, PLEASE NOTIFY RADIOLOGY IMMEDIATELY AT [phone]! YOU WILL BE GIVEN A 13 HOUR PREMEDICATION PREP.  1) Do not eat or drink anything after 7:30am (4 hours prior to your test) 2) You have been given 2 bottles of oral contrast to drink. The solution may taste better if refrigerated, but do NOT add ice or any other liquid to this solution. Shake well before drinking.    Drink 1 bottle of contrast @ 9:30am (2 hours prior to your exam)  Drink 1 bottle of contrast @ 10:30am (1 hour prior to your exam)  You may take any medications as prescribed with a small amount of water except for the following: Metformin, Glucophage, Glucovance, Avandamet, Riomet, Fortamet, Actoplus Met, Janumet, Glumetza or Metaglip. The above medications must be held the day of the exam AND 48 hours after the exam.  The purpose of you drinking the oral contrast is to aid in the visualization of your intestinal tract. The contrast solution may cause some diarrhea. Before your exam is started, you will be given a small amount of fluid to drink. Depending on your individual set of symptoms, you may also receive an intravenous injection of x-ray contrast/dye. Plan on being at [facility] for 30 minutes or long, depending on the type of exam you are having performed.  This test typically takes 30-45  minutes to complete.  If you have any questions regarding your exam or if you need to reschedule, you may call the CT department at [phone] between the hours of 8:00 am and 5:00 pm, Monday-Friday.  ________________________________________________________________________  Thank you for choosing me and [facility].  Pricilla Riffle. Dagoberto Ligas., MD., [facility]  cc: Crist Infante, MD

## 2013-08-12 ENCOUNTER — Ambulatory Visit (INDEPENDENT_AMBULATORY_CARE_PROVIDER_SITE_OTHER)
Admission: RE | Admit: 2013-08-12 | Discharge: 2013-08-12 | Disposition: A | Discharge status: Home / Self Care | Payer: Medicare Other | Source: Ambulatory Visit | Attending: Gastroenterology | Admitting: Gastroenterology

## 2013-08-12 ENCOUNTER — Telehealth: Payer: Self-pay | Admitting: Gastroenterology

## 2013-08-12 DIAGNOSIS — R1031 Right lower quadrant pain: Secondary | ICD-10-CM

## 2013-08-12 DIAGNOSIS — K5730 Diverticulosis of large intestine without perforation or abscess without bleeding: Secondary | ICD-10-CM | POA: Diagnosis not present

## 2013-08-12 MED ORDER — IOHEXOL 300 MG/ML  SOLN
100.0000 mL | Freq: Once | INTRAMUSCULAR | Status: AC | PRN
  Administered 2013-08-12: 100 mL via INTRAVENOUS

## 2013-08-15 NOTE — Telephone Encounter (Signed)
Patient notified of results of CT scan.  All questions answered

## 2013-08-16 DIAGNOSIS — Z124 Encounter for screening for malignant neoplasm of cervix: Secondary | ICD-10-CM | POA: Diagnosis not present

## 2013-08-31 DIAGNOSIS — E559 Vitamin D deficiency, unspecified: Secondary | ICD-10-CM | POA: Diagnosis not present

## 2013-08-31 DIAGNOSIS — M109 Gout, unspecified: Secondary | ICD-10-CM | POA: Diagnosis not present

## 2013-08-31 DIAGNOSIS — Z79899 Other long term (current) drug therapy: Secondary | ICD-10-CM | POA: Diagnosis not present

## 2013-08-31 DIAGNOSIS — R82998 Other abnormal findings in urine: Secondary | ICD-10-CM | POA: Diagnosis not present

## 2013-09-07 DIAGNOSIS — E559 Vitamin D deficiency, unspecified: Secondary | ICD-10-CM | POA: Diagnosis not present

## 2013-09-07 DIAGNOSIS — Z23 Encounter for immunization: Secondary | ICD-10-CM | POA: Diagnosis not present

## 2013-09-07 DIAGNOSIS — M810 Age-related osteoporosis without current pathological fracture: Secondary | ICD-10-CM | POA: Diagnosis not present

## 2013-09-07 DIAGNOSIS — D131 Benign neoplasm of stomach: Secondary | ICD-10-CM | POA: Diagnosis not present

## 2013-09-07 DIAGNOSIS — Z1331 Encounter for screening for depression: Secondary | ICD-10-CM | POA: Diagnosis not present

## 2013-09-07 DIAGNOSIS — R609 Edema, unspecified: Secondary | ICD-10-CM | POA: Diagnosis not present

## 2013-09-07 DIAGNOSIS — Z0000 Encounter for general adult medical examination without abnormal findings: Secondary | ICD-10-CM | POA: Diagnosis not present

## 2013-09-07 DIAGNOSIS — M109 Gout, unspecified: Secondary | ICD-10-CM | POA: Diagnosis not present

## 2013-09-07 DIAGNOSIS — R7301 Impaired fasting glucose: Secondary | ICD-10-CM | POA: Diagnosis not present

## 2013-09-21 DIAGNOSIS — E785 Hyperlipidemia, unspecified: Secondary | ICD-10-CM | POA: Diagnosis not present

## 2013-09-21 DIAGNOSIS — Z0000 Encounter for general adult medical examination without abnormal findings: Secondary | ICD-10-CM | POA: Diagnosis not present

## 2013-09-23 DIAGNOSIS — H2510 Age-related nuclear cataract, unspecified eye: Secondary | ICD-10-CM | POA: Diagnosis not present

## 2013-09-23 DIAGNOSIS — H40039 Anatomical narrow angle, unspecified eye: Secondary | ICD-10-CM | POA: Diagnosis not present

## 2013-10-25 DIAGNOSIS — L570 Actinic keratosis: Secondary | ICD-10-CM | POA: Diagnosis not present

## 2013-10-25 DIAGNOSIS — D485 Neoplasm of uncertain behavior of skin: Secondary | ICD-10-CM | POA: Diagnosis not present

## 2014-01-10 DIAGNOSIS — Z6827 Body mass index (BMI) 27.0-27.9, adult: Secondary | ICD-10-CM | POA: Diagnosis not present

## 2014-01-10 DIAGNOSIS — M810 Age-related osteoporosis without current pathological fracture: Secondary | ICD-10-CM | POA: Diagnosis not present

## 2014-01-25 DIAGNOSIS — E785 Hyperlipidemia, unspecified: Secondary | ICD-10-CM | POA: Diagnosis not present

## 2014-02-03 ENCOUNTER — Encounter: Payer: Self-pay | Admitting: Gastroenterology

## 2014-03-06 ENCOUNTER — Telehealth: Payer: Self-pay | Admitting: Cardiovascular Disease

## 2014-03-06 ENCOUNTER — Encounter: Payer: Self-pay | Admitting: Cardiovascular Disease

## 2014-03-07 NOTE — Telephone Encounter (Signed)
Closed encounter °

## 2014-03-14 DIAGNOSIS — Z23 Encounter for immunization: Secondary | ICD-10-CM | POA: Diagnosis not present

## 2014-04-24 ENCOUNTER — Ambulatory Visit: Payer: Medicare Other | Admitting: Cardiovascular Disease

## 2014-04-25 ENCOUNTER — Ambulatory Visit (INDEPENDENT_AMBULATORY_CARE_PROVIDER_SITE_OTHER): Payer: Medicare Other | Admitting: Cardiovascular Disease

## 2014-04-25 ENCOUNTER — Encounter: Payer: Self-pay | Admitting: Cardiovascular Disease

## 2014-04-25 VITALS — BP 130/90 | HR 66 | Ht 62.0 in | Wt 152.8 lb

## 2014-04-25 DIAGNOSIS — I10 Essential (primary) hypertension: Secondary | ICD-10-CM | POA: Insufficient documentation

## 2014-04-25 DIAGNOSIS — E782 Mixed hyperlipidemia: Secondary | ICD-10-CM | POA: Diagnosis not present

## 2014-04-25 DIAGNOSIS — K219 Gastro-esophageal reflux disease without esophagitis: Secondary | ICD-10-CM | POA: Diagnosis not present

## 2014-04-25 DIAGNOSIS — I519 Heart disease, unspecified: Secondary | ICD-10-CM

## 2014-04-25 DIAGNOSIS — E785 Hyperlipidemia, unspecified: Secondary | ICD-10-CM

## 2014-04-25 DIAGNOSIS — Z79899 Other long term (current) drug therapy: Secondary | ICD-10-CM | POA: Diagnosis not present

## 2014-04-25 DIAGNOSIS — E790 Hyperuricemia without signs of inflammatory arthritis and tophaceous disease: Secondary | ICD-10-CM

## 2014-04-25 MED ORDER — LOSARTAN POTASSIUM 50 MG PO TABS: ORAL_TABLET | ORAL | Status: DC

## 2014-04-25 NOTE — Patient Instructions (Signed)
Your physician has recommended you make the following change in your medication: start  New prescription for losartan as directed on the bottle.this prescription has already been sent to the pharmacy.  Your physician recommends that you return for lab work fasting. No appointment needed.  Your physician wants you to follow-up in: 6 months or sooner if needed. You will receive a reminder letter in the mail two months in advance. If you don't receive a letter, please call our office to schedule the follow-up appointment.

## 2014-04-25 NOTE — Progress Notes (Signed)
Patient ID: Angela Preston, female   DOB: [birth year], 73 y.o.   MRN: [id number]     HPI: Angela Preston is a 73 year old white female who is a former patient of Dr. Terance Ice. Angela Preston  established cardiology care with me in December 2015 and presents for one-year follow-up evaluation.    Angela Preston has a history of hypertension, osteoporosis, GERD, and has undergone 2 prior cardiac catheterizations. Angela Preston recalls approximately 20 years ago Angela Preston underwent initial cardiac catheterization by Dr. Melvern Banker and over 10 years ago  another catheterization by Dr. Tami Ribas.  Angela Preston was told that Angela Preston coronaries were normal.  An echo Doppler study in November 2013 showed normal systolic function, which with grade 1 diastolic dysfunction.  There was mild mitral regurgitation.  Angela Preston has a history of hyperlipidemia but developed myalgias secondary to pravastatin, Lipitor and Crestor and has been able to tolerate Livalo which currently is at 4 mg.  When Angela Preston had been seen by Dr. Rollene Fare.  Angela Preston was on Diovan 80 mg in addition to [place].  At that time Angela Preston blood pressure was normal in the 696 systolic.  When Angela Preston was seen by me one year ago.  Apparently, Angela Preston was not on the ARB therapy.  Angela Preston renal function was normal.  Angela Preston blood pressure was 295 systolically.  Angela Preston cannot recall any side effects to ARB therapy.  Angela Preston has undergone colonoscopy and endoscopy by Dr. Lucio Edward.  Angela Preston also is now seeing Dr. Abner Greenspan for primary care and previously had seen Dr. Wayland Denis.  Angela Preston has osteoporosis and has undergone injections.  Angela Preston states that the past year has been stressful since Angela Preston 22 year old daughter developed breast cancer and required bilateral mastectomy.  Fortunately, Angela Preston seems to be doing well without recurrence.  The patient admits to fatigue.  Angela Preston denies chest pain or shortness of breath.  Angela Preston is unaware of palpitations.  Past Medical History  Diagnosis Date  . Hypertension   . Osteoporosis   . Hyperlipidemia   . GERD  (gastroesophageal reflux disease)   . Internal hemorrhoids   . Diverticulosis   . Status post dilation of esophageal narrowing   . Vitamin D deficiency   . Hyperuricemia   . History of nuclear stress test 03/2012    bruce myoview; normal pattern of perfusion in all regions, post-stress EF 76%, no ECG changes; low risk scan   . Cataract   . Hiatal hernia   . Tubular adenoma of colon     Past Surgical History  Procedure Laterality Date  . Total hip arthroplasty Left   . Abdominal hysterectomy    . Umbilical hernia repair    . Lymph nodes removed  1990's    due to cat scratch fever  . Cardiac catheterization  1993    normal coronaries  . Cardiac catheterization  2003    normal coronaries  . Transthoracic echocardiogram  04/12/2012    EF 28-41%, grade 1 diastolic dysfunction; mild MR; normal PA pressure   . Colonoscopy    . Cataract extraction, bilateral    . Tonsillectomy      No Known Allergies  Current Outpatient Prescriptions  Medication Sig Dispense Refill  . aspirin 81 MG tablet Take 81 mg by mouth daily.    . calcium-vitamin D (OSCAL WITH D) 500-200 MG-UNIT per tablet Take 1 tablet by mouth daily.    Marland Kitchen esomeprazole (NEXIUM) 40 MG capsule Take 1 capsule (40 mg total) by mouth 2 (two) times daily before a meal. 60  capsule 11  . Glucosamine-Chondroit-Vit C-Mn (GLUCOSAMINE 1500 COMPLEX) CAPS Take 1,500 capsules by mouth daily.    Marland Kitchen glycopyrrolate (ROBINUL) 1 MG tablet Take 1 tablet (1 mg total) by mouth 2 (two) times daily. 60 tablet 11  . LIVALO 4 MG TABS Take 1 tablet by mouth daily.  11  . LORazepam (ATIVAN) 0.5 MG tablet Take 0.5 mg by mouth as needed.     . Multiple Vitamins-Minerals (MULTIVITAMIN WITH MINERALS) tablet Take 1 tablet by mouth daily.    Marland Kitchen triamterene-hydrochlorothiazide (MAXZIDE) 75-50 MG per tablet Take 1 tablet by mouth daily.    . vitamin B-12 (CYANOCOBALAMIN) 1000 MCG tablet Take 1,000 mcg by mouth 2 (two) times daily.     No current  facility-administered medications for this visit.    Socially Angela Preston is married. Angela Preston lives with Angela Preston husband in a [address] and they have 18 cows. There is no tobacco use. There is no alcohol use. Angela Preston has 4 children 6 grandchildren and 2 great-grandchildren.  Family History  Problem Relation Age of Onset  . Colon polyps Brother     x 2  . Heart attack Father   . Dementia Mother   . Stroke Mother   . Heart disease Maternal Grandmother   . Kidney disease Paternal Grandfather   . Heart Problems Brother   . Colon cancer Neg Hx   . Esophageal cancer Neg Hx   . Stomach cancer Neg Hx   . Rectal cancer Neg Hx     ROS General: Negative; No fevers, chills, or night sweats; positive for fatigue HEENT: Negative; No changes in vision or hearing, sinus congestion, difficulty swallowing Pulmonary: Negative; No cough, wheezing, shortness of breath, hemoptysis Cardiovascular: Negative; No chest pain, presyncope, syncope, palpitations GI: Positive for GERD, and trolled seem 40 mg; No nausea, vomiting, diarrhea, or abdominal pain GU: Negative; No dysuria, hematuria, or difficulty voiding Musculoskeletal: Negative; no myalgias, joint pain, or weakness Positive for osteoporosis Hematologic/Oncology: Negative; no easy bruising, bleeding Endocrine: Negative; no heat/cold intolerance; no diabetes Neuro: Negative; no changes in balance, headaches Skin: Negative; No rashes or skin lesions Psychiatric: Negative; No behavioral problems, depression Sleep: Negative; No snoring, daytime sleepiness, hypersomnolence, bruxism, restless legs, hypnogognic hallucinations, no cataplexy Other comprehensive 14 point system review is negative.  PE BP 130/90 mmHg  Pulse 66  Ht 5' 2"  (1.575 m)  Wt 152 lb 12.8 oz (69.31 kg)  BMI 27.94 kg/m2 General: Alert, oriented, no distress.  Skin: normal turgor, no rashes HEENT: Normocephalic, atraumatic. Pupils round and reactive; sclera anicteric; Fundi no arteriolar  narrowing hemorrhages or exudates the Nose without nasal septal hypertrophy Mouth/Parynx benign; Mallinpatti scale 2 Neck: No JVD, no carotid bruits with normal carotid upstroke Lungs: clear to ausculatation and percussion; no wheezing or rales Chest wall: Nontender to palpation Heart: RRR, s1 s2 normal 1/6 systolic murmur left sternal border.  No diastolic murmur.  No rubs thrills or heaves. Abdomen: soft, nontender; no hepatosplenomehaly, BS+; abdominal aorta nontender and not dilated by palpation. Back: No CVA tenderness Pulses 2+ Extremities: no clubbinbg cyanosis or edema, Homan's sign negative  Neurologic: grossly nonfocal Psychologic: Normal mood and affect  ECG (independently read by me): Normal sinus rhythm at 66 bpm.  Nonspecific ST changes.  December 2014 ECG: Sinus rhythm at 80 beats per minute. One isolated PVC. QTc interval 465 ms.  LABS:  BMET    Component Value Date/Time   NA 138 08/10/2013 0939   K 4.2 08/10/2013 0939   CL 99 08/10/2013  0939   CO2 31 08/10/2013 0939   GLUCOSE 110* 08/10/2013 0939   BUN 13 08/10/2013 0939   CREATININE 0.8 08/10/2013 0939   CALCIUM 10.0 08/10/2013 0939   GFRNONAA >60 04/29/2007 0430   GFRAA  04/29/2007 0430    >60        The eGFR has been calculated using the MDRD equation. This calculation has not been validated in all clinical     Hepatic Function Panel     Component Value Date/Time   PROT 7.7 08/10/2013 0939     CBC    Component Value Date/Time   WBC 6.7 08/10/2013 0939   RBC 4.51 08/10/2013 0939   HGB 14.2 08/10/2013 0939   HCT 42.0 08/10/2013 0939   PLT 317.0 08/10/2013 0939   MCV 93.1 08/10/2013 0939   MCHC 33.7 08/10/2013 0939   RDW 13.2 08/10/2013 0939   LYMPHSABS 2.4 08/10/2013 0939   MONOABS 0.6 08/10/2013 0939   EOSABS 0.5 08/10/2013 0939   BASOSABS 0.0 08/10/2013 0939     BNP No results found for: PROBNP  Lipid Panel  No results found for: CHOL   RADIOLOGY: No results  found.   ASSESSMENT AND PLAN: Angela Preston is a 73 year old female who has had several episodes of chest pain in the past and has undergone 2 prior cardiac catheterizations and was told of having normal coronary arteries. A nuclear perfusion study in November 2013 which did not reveal scar or ischemia.  Remotely, Angela Preston has been documented have grade 1 diastolic dysfunction with normal systolic function.  Angela Preston had been on valsartan 80 mg in addition to Maxzide diuretic therapy with optimal blood pressure.  For some reason Angela Preston has not been on ARB therapy for over a year.  Angela Preston blood pressure today when rechecked by me was 144/88.  Angela Preston blood pressure when checked by the nurse was 130/90.  Laboratory has revealed normal renal function and electrolytes.  With Angela Preston documented diastolic dysfunction, and borderline blood pressure elevation.  I have recommended reinstitution of low-dose ARB therapy.  I will start Angela Preston on losartan at 25 mg initially for the first 2 weeks.  During that time, we will obtain a complete set of laboratory.  If Angela Preston renal function remains stable I will further titrate Angela Preston losartan to 50 mg daily.  Angela Preston will be seeing Dr. Abner Greenspan in April for follow-up of Angela Preston primary care and Angela Preston osteoporosis.  Angela Preston's not having any chest pain symptoms.  Angela Preston seems to be tolerating Livalo without myalgias.  Adjustments will be made to Angela Preston medications if necessary depending upon the laboratory results.  I will see Angela Preston in 6 months for cardiology reevaluation.  Time spent: 25 minutes  Troy Sine, MD, [facility] 04/25/2014 10:27 AM

## 2014-05-09 DIAGNOSIS — E782 Mixed hyperlipidemia: Secondary | ICD-10-CM | POA: Diagnosis not present

## 2014-05-09 DIAGNOSIS — Z79899 Other long term (current) drug therapy: Secondary | ICD-10-CM | POA: Diagnosis not present

## 2014-05-10 LAB — COMPREHENSIVE METABOLIC PANEL
ALK PHOS: 54 U/L (ref 39–117)
ALT: 15 U/L (ref 0–35)
AST: 20 U/L (ref 0–37)
Albumin: 4.5 g/dL (ref 3.5–5.2)
BUN: 12 mg/dL (ref 6–23)
CHLORIDE: 101 meq/L (ref 96–112)
CO2: 27 mEq/L (ref 19–32)
Calcium: 9.9 mg/dL (ref 8.4–10.5)
Creat: 0.79 mg/dL (ref 0.50–1.10)
Glucose, Bld: 96 mg/dL (ref 70–99)
POTASSIUM: 4.2 meq/L (ref 3.5–5.3)
SODIUM: 138 meq/L (ref 135–145)
TOTAL PROTEIN: 7.3 g/dL (ref 6.0–8.3)
Total Bilirubin: 0.6 mg/dL (ref 0.2–1.2)

## 2014-05-10 LAB — CBC
HEMATOCRIT: 42.4 % (ref 36.0–46.0)
Hemoglobin: 14.1 g/dL (ref 12.0–15.0)
MCH: 30.9 pg (ref 26.0–34.0)
MCHC: 33.3 g/dL (ref 30.0–36.0)
MCV: 93 fL (ref 78.0–100.0)
MPV: 10.5 fL (ref 9.4–12.4)
PLATELETS: 342 10*3/uL (ref 150–400)
RBC: 4.56 MIL/uL (ref 3.87–5.11)
RDW: 13.4 % (ref 11.5–15.5)
WBC: 7.4 10*3/uL (ref 4.0–10.5)

## 2014-05-10 LAB — LIPID PANEL
Cholesterol: 165 mg/dL (ref 0–200)
HDL: 47 mg/dL (ref >39)
LDL CALC: 84 mg/dL (ref 0–99)
Total CHOL/HDL Ratio: 3.5 Ratio
Triglycerides: 168 mg/dL — ABNORMAL HIGH (ref <150)
VLDL: 34 mg/dL (ref 0–40)

## 2014-05-10 LAB — TSH: TSH: 2.611 u[IU]/mL (ref 0.350–4.500)

## 2014-05-11 ENCOUNTER — Encounter: Payer: Self-pay | Admitting: *Deleted

## 2014-06-14 ENCOUNTER — Encounter: Payer: Self-pay | Admitting: Gastroenterology

## 2014-06-19 ENCOUNTER — Encounter: Payer: Self-pay | Admitting: Gastroenterology

## 2014-07-04 ENCOUNTER — Ambulatory Visit (AMBULATORY_SURGERY_CENTER): Payer: Self-pay

## 2014-07-04 VITALS — Ht 61.0 in | Wt 148.4 lb

## 2014-07-04 DIAGNOSIS — Z8371 Family history of colonic polyps: Secondary | ICD-10-CM

## 2014-07-04 DIAGNOSIS — Z86010 Personal history of colonic polyps: Secondary | ICD-10-CM

## 2014-07-04 MED ORDER — MOVIPREP 100 G PO SOLR: ORAL | Status: DC

## 2014-07-04 NOTE — Progress Notes (Signed)
Per pt, no allergies to soy or egg products.Pt not taking any weight loss meds or using  O2 at home. 

## 2014-07-11 DIAGNOSIS — Z79899 Other long term (current) drug therapy: Secondary | ICD-10-CM | POA: Diagnosis not present

## 2014-07-11 DIAGNOSIS — M810 Age-related osteoporosis without current pathological fracture: Secondary | ICD-10-CM | POA: Diagnosis not present

## 2014-07-11 DIAGNOSIS — Z6827 Body mass index (BMI) 27.0-27.9, adult: Secondary | ICD-10-CM | POA: Diagnosis not present

## 2014-07-11 DIAGNOSIS — E559 Vitamin D deficiency, unspecified: Secondary | ICD-10-CM | POA: Diagnosis not present

## 2014-07-18 ENCOUNTER — Encounter: Payer: Self-pay | Admitting: Gastroenterology

## 2014-07-18 ENCOUNTER — Ambulatory Visit (AMBULATORY_SURGERY_CENTER): Payer: Medicare Other | Admitting: Gastroenterology

## 2014-07-18 VITALS — BP 117/71 | HR 64 | Temp 97.7°F | Resp 24 | Ht 61.0 in | Wt 148.0 lb

## 2014-07-18 DIAGNOSIS — Z86010 Personal history of colonic polyps: Secondary | ICD-10-CM | POA: Diagnosis not present

## 2014-07-18 DIAGNOSIS — D123 Benign neoplasm of transverse colon: Secondary | ICD-10-CM

## 2014-07-18 DIAGNOSIS — Z85038 Personal history of other malignant neoplasm of large intestine: Secondary | ICD-10-CM | POA: Diagnosis not present

## 2014-07-18 DIAGNOSIS — I10 Essential (primary) hypertension: Secondary | ICD-10-CM | POA: Diagnosis not present

## 2014-07-18 MED ORDER — SODIUM CHLORIDE 0.9 % IV SOLN: 500.0000 mL | INTRAVENOUS | Status: DC

## 2014-07-18 NOTE — Op Note (Signed)
[place]  [address] [place], [id number]   COLONOSCOPY PROCEDURE REPORT  PATIENT: Angela Preston, Angela Preston  MR#: [id number] BIRTHDATE: 1940-11-09 , 32  yrs. old GENDER: female ENDOSCOPIST: Ladene Artist, MD, [facility] PROCEDURE DATE:  07/18/2014 PROCEDURE:   Colonoscopy with snare polypectomy First Screening Colonoscopy - Avg.  risk and is 50 yrs.  old or older - No.  Prior Negative Screening - Now for repeat screening. N/A  History of Adenoma - Now for follow-up colonoscopy & has been > or = to 3 yrs.  No.  It has been less than 3 yrs since last colonoscopy.  Medical reason.  Polyps Removed Today? Yes. ASA CLASS:   Class II INDICATIONS:follow up of adenomatous colonic polyp(s). MEDICATIONS: Monitored anesthesia care and Propofol 200 mg IV DESCRIPTION OF PROCEDURE:   After the risks benefits and alternatives of the procedure were thoroughly explained, informed consent was obtained.  The digital rectal exam revealed no abnormalities of the rectum.   The LB [id number]  endoscope was introduced through the anus and advanced to the cecum, which was identified by both the appendix and ileocecal valve. No adverse events experienced.   The quality of the prep was excellent, using MoviPrep  The instrument was then slowly withdrawn as the colon was fully examined.  COLON FINDINGS: A sessile polyp measuring 8 mm in size was found in the proximal transverse colon.  A polypectomy was performed using snare cautery.  The resection was complete, the polyp tissue was completely retrieved and sent to histology.   There was mild diverticulosis noted in the sigmoid colon, transverse colon, and ascending colon.   The examination was otherwise normal. Retroflexed views revealed internal Grade I hemorrhoids. The time to cecum=3 minutes 28 seconds.  Withdrawal time=10 minutes 36 seconds.  The scope was withdrawn and the procedure completed. COMPLICATIONS: There were no immediate  complications.  ENDOSCOPIC IMPRESSION: 1.   Sessile polyp in the proximal transverse colon; polypectomy performed using snare cautery 2.   Mild diverticulosis in the sigmoid colon, transverse colon, and ascending colon 3.   Grade l internal hemorrhoids  RECOMMENDATIONS: 1.  Hold Aspirin and all other NSAIDS for 2 weeks. 2.  Await pathology results 3.  High fiber diet with liberal fluid intake. 4.  Repeat Colonoscopy in 3 years.  eSigned:  Ladene Artist, MD, [facility] 07/18/2014 2:22 PM

## 2014-07-18 NOTE — Progress Notes (Signed)
Patient may be discharged in 20 minutes per Dr. Fuller Plan.

## 2014-07-18 NOTE — Patient Instructions (Signed)
YOU HAD AN ENDOSCOPIC PROCEDURE TODAY AT [facility]:   Refer to the procedure report that was given to you for any specific questions about what was found during the examination.  If the procedure report does not answer your questions, please call your gastroenterologist to clarify.  If you requested that your care partner not be given the details of your procedure findings, then the procedure report has been included in a sealed envelope for you to review at your convenience later.  YOU SHOULD EXPECT: Some feelings of bloating in the abdomen. Passage of more gas than usual.  Walking can help get rid of the air that was put into your GI tract during the procedure and reduce the bloating. If you had a lower endoscopy (such as a colonoscopy or flexible sigmoidoscopy) you may notice spotting of blood in your stool or on the toilet paper. If you underwent a bowel prep for your procedure, you may not have a normal bowel movement for a few days.  Please Note:  You might notice some irritation and congestion in your nose or some drainage.  This is from the oxygen used during your procedure.  There is no need for concern and it should clear up in a day or so.  SYMPTOMS TO REPORT IMMEDIATELY:   Following lower endoscopy (colonoscopy or flexible sigmoidoscopy):  Excessive amounts of blood in the stool  Significant tenderness or worsening of abdominal pains  Swelling of the abdomen that is new, acute  Fever of 100F or higher  A gastroenterologist can be reached at any hour by calling [phone].   DIET: Your first meal following the procedure should be a small meal and then it is ok to progress to your normal diet. Heavy or fried foods are harder to digest and may make you feel nauseous or bloated.  Likewise, meals heavy in dairy and vegetables can increase bloating.  Drink plenty of fluids but you should avoid alcoholic beverages for 24 hours. Try to increase the fiber in your  diet.  ACTIVITY:  You should plan to take it easy for the rest of today and you should NOT DRIVE or use heavy machinery until tomorrow (because of the sedation medicines used during the test).    FOLLOW UP: Our staff will call the number listed on your records the next business day following your procedure to check on you and address any questions or concerns that you may have regarding the information given to you following your procedure. If we do not reach you, we will leave a message.  However, if you are feeling well and you are not experiencing any problems, there is no need to return our call.  We will assume that you have returned to your regular daily activities without incident.  If any biopsies were taken you will be contacted by phone or by letter within the next 1-3 weeks.  Please call us at [phone] if you have not heard about the biopsies in 3 weeks.    SIGNATURES/CONFIDENTIALITY: You and/or your care partner have signed paperwork which will be entered into your electronic medical record.  These signatures attest to the fact that that the information above on your After Visit Summary has been reviewed and is understood.  Full responsibility of the confidentiality of this discharge information lies with you and/or your care-partner.  Read all of the handouts given to you by your recovery room nurse.

## 2014-07-18 NOTE — Progress Notes (Signed)
Called to room to assist during endoscopic procedure.  Patient ID and intended procedure confirmed with present staff. Received instructions for my participation in the procedure from the performing physician.  

## 2014-07-18 NOTE — Progress Notes (Signed)
Procedure ends, to recovery, report given and VSS,

## 2014-07-19 ENCOUNTER — Telehealth: Payer: Self-pay

## 2014-07-19 NOTE — Telephone Encounter (Signed)
  Follow up Call-  Call back number 07/18/2014 06/14/2013  Post procedure Call Back phone  # [phone]  Permission to leave phone message Yes Yes     Patient questions:  Do you have a fever, pain , or abdominal swelling? No. Pain Score  0 *  Have you tolerated food without any problems? Yes.    Have you been able to return to your normal activities? Yes.    Do you have any questions about your discharge instructions: Diet   No. Medications  No. Follow up visit  No.  Do you have questions or concerns about your Care? Yes.    Actions: * If pain score is 4 or above: No action needed, pain <4.

## 2014-07-25 ENCOUNTER — Encounter: Payer: Self-pay | Admitting: Gastroenterology

## 2014-08-02 ENCOUNTER — Ambulatory Visit (HOSPITAL_COMMUNITY)
Admission: RE | Admit: 2014-08-02 | Discharge: 2014-08-02 | Disposition: A | Discharge status: Home / Self Care | Payer: Medicare Other | Source: Ambulatory Visit | Attending: Internal Medicine | Admitting: Internal Medicine

## 2014-08-02 DIAGNOSIS — Z5181 Encounter for therapeutic drug level monitoring: Secondary | ICD-10-CM | POA: Diagnosis not present

## 2014-08-02 DIAGNOSIS — M810 Age-related osteoporosis without current pathological fracture: Secondary | ICD-10-CM | POA: Diagnosis not present

## 2014-08-02 MED ORDER — DENOSUMAB 60 MG/ML ~~LOC~~ SOLN
60.0000 mg | Freq: Once | SUBCUTANEOUS | Status: AC
  Administered 2014-08-02: 60 mg via SUBCUTANEOUS
  Filled 2014-08-02: qty 1

## 2014-08-02 NOTE — Discharge Instructions (Signed)
PROLIA °Denosumab injection °What is this medicine? °DENOSUMAB (den oh sue mab) slows bone breakdown. Prolia is used to treat osteoporosis in women after menopause and in men. Xgeva is used to prevent bone fractures and other bone problems caused by cancer bone metastases. Xgeva is also used to treat giant cell tumor of the bone. °This medicine may be used for other purposes; ask your health care provider or pharmacist if you have questions. °COMMON BRAND NAME(S): Prolia, XGEVA °What should I tell my health care provider before I take this medicine? °They need to know if you have any of these conditions: °-dental disease °-eczema °-infection or history of infections °-kidney disease or on dialysis °-low blood calcium or vitamin D °-malabsorption syndrome °-scheduled to have surgery or tooth extraction °-taking medicine that contains denosumab °-thyroid or parathyroid disease °-an unusual reaction to denosumab, other medicines, foods, dyes, or preservatives °-pregnant or trying to get pregnant °-breast-feeding °How should I use this medicine? °This medicine is for injection under the skin. It is given by a health care professional in a hospital or clinic setting. °If you are getting Prolia, a special MedGuide will be given to you by the pharmacist with each prescription and refill. Be sure to read this information carefully each time. °For Prolia, talk to your pediatrician regarding the use of this medicine in children. Special care may be needed. For Xgeva, talk to your pediatrician regarding the use of this medicine in children. While this drug may be prescribed for children as young as 13 years for selected conditions, precautions do apply. °Overdosage: If you think you've taken too much of this medicine contact a poison control center or emergency room at once. °Overdosage: If you think you have taken too much of this medicine contact a poison control center or emergency room at once. °NOTE: This medicine is only  for you. Do not share this medicine with others. °What if I miss a dose? °It is important not to miss your dose. Call your doctor or health care professional if you are unable to keep an appointment. °What may interact with this medicine? °Do not take this medicine with any of the following medications: °-other medicines containing denosumab °This medicine may also interact with the following medications: °-medicines that suppress the immune system °-medicines that treat cancer °-steroid medicines like prednisone or cortisone °This list may not describe all possible interactions. Give your health care provider a list of all the medicines, herbs, non-prescription drugs, or dietary supplements you use. Also tell them if you smoke, drink alcohol, or use illegal drugs. Some items may interact with your medicine. °What should I watch for while using this medicine? °Visit your doctor or health care professional for regular checks on your progress. Your doctor or health care professional may order blood tests and other tests to see how you are doing. °Call your doctor or health care professional if you get a cold or other infection while receiving this medicine. Do not treat yourself. This medicine may decrease your body's ability to fight infection. °You should make sure you get enough calcium and vitamin D while you are taking this medicine, unless your doctor tells you not to. Discuss the foods you eat and the vitamins you take with your health care professional. °See your dentist regularly. Brush and floss your teeth as directed. Before you have any dental work done, tell your dentist you are receiving this medicine. °Do not become pregnant while taking this medicine or for 5 months after   stopping it. Women should inform their doctor if they wish to become pregnant or think they might be pregnant. There is a potential for serious side effects to an unborn child. Talk to your health care professional or pharmacist for  more information. °What side effects may I notice from receiving this medicine? °Side effects that you should report to your doctor or health care professional as soon as possible: °-allergic reactions like skin rash, itching or hives, swelling of the face, lips, or tongue °-breathing problems °-chest pain °-fast, irregular heartbeat °-feeling faint or lightheaded, falls °-fever, chills, or any other sign of infection °-muscle spasms, tightening, or twitches °-numbness or tingling °-skin blisters or bumps, or is dry, peels, or red °-slow healing or unexplained pain in the mouth or jaw °-unusual bleeding or bruising °Side effects that usually do not require medical attention (Report these to your doctor or health care professional if they continue or are bothersome.): °-muscle pain °-stomach upset, gas °This list may not describe all possible side effects. Call your doctor for medical advice about side effects. You may report side effects to FDA at 1-800-FDA-1088. °Where should I keep my medicine? °This medicine is only given in a clinic, doctor's office, or other health care setting and will not be stored at home. °NOTE: This sheet is a summary. It may not cover all possible information. If you have questions about this medicine, talk to your doctor, pharmacist, or health care provider. °© 2015, [employer]. (2011-11-03 12:37:47) °Osteoporosis °Throughout your life, your body breaks down old bone and replaces it with new bone. As you get older, your body does not replace bone as quickly as it breaks it down. By the age of 30 years, most people begin to gradually lose bone because of the imbalance between bone loss and replacement. Some people lose more bone than others. Bone loss beyond a specified normal degree is considered osteoporosis.  °Osteoporosis affects the strength and durability of your bones. The inside of the ends of your bones and your flat bones, like the bones of your pelvis, look like  honeycomb, filled with tiny open spaces. As bone loss occurs, your bones become less dense. This means that the open spaces inside your bones become bigger and the walls between these spaces become thinner. This makes your bones weaker. Bones of a person with osteoporosis can become so weak that they can break (fracture) during minor accidents, such as a simple fall. °CAUSES  °The following factors have been associated with the development of osteoporosis: °· Smoking. °· Drinking more than 2 alcoholic drinks several days per week. °· Long-term use of certain medicines: °¨ Corticosteroids. °¨ Chemotherapy medicines. °¨ Thyroid medicines. °¨ Antiepileptic medicines. °¨ Gonadal hormone suppression medicine. °¨ Immunosuppression medicine. °· Being underweight. °· Lack of physical activity. °· Lack of exposure to the sun. This can lead to vitamin D deficiency. °· Certain medical conditions: °¨ Certain inflammatory bowel diseases, such as Crohn disease and ulcerative colitis. °¨ Diabetes. °¨ Hyperthyroidism. °¨ Hyperparathyroidism. °RISK FACTORS °Anyone can develop osteoporosis. However, the following factors can increase your risk of developing osteoporosis: °· Gender--Women are at higher risk than men. °· Age--Being older than 50 years increases your risk. °· Ethnicity--White and Asian people have an increased risk. °· Weight --Being extremely underweight can increase your risk of osteoporosis. °· Family history of osteoporosis--Having a family member who has developed osteoporosis can increase your risk. °SYMPTOMS  °Usually, people with osteoporosis have no symptoms.  °DIAGNOSIS  °Signs during   a physical exam that may prompt your caregiver to suspect osteoporosis include: °· Decreased height. This is usually caused by the compression of the bones that form your spine (vertebrae) because they have weakened and become fractured. °· A curving or rounding of the upper back (kyphosis). °To confirm signs of osteoporosis,  your caregiver may request a procedure that uses 2 low-dose X-ray beams with different levels of energy to measure your bone mineral density (dual-energy X-ray absorptiometry [DXA]). Also, your caregiver may check your level of vitamin D. °TREATMENT  °The goal of osteoporosis treatment is to strengthen bones in order to decrease the risk of bone fractures. There are different types of medicines available to help achieve this goal. Some of these medicines work by slowing the processes of bone loss. Some medicines work by increasing bone density. Treatment also involves making sure that your levels of calcium and vitamin D are adequate. °PREVENTION  °There are things you can do to help prevent osteoporosis. Adequate intake of calcium and vitamin D can help you achieve optimal bone mineral density. Regular exercise can also help, especially resistance and weight-bearing activities. If you smoke, quitting smoking is an important part of osteoporosis prevention. °MAKE SURE YOU: °· Understand these instructions. °· Will watch your condition. °· Will get help right away if you are not doing well or get worse. °FOR MORE INFORMATION °www.osteo.org and www.nof.org °Document Released: 02/12/2005 Document Revised: 08/30/2012 Document Reviewed: 04/19/2011 °ExitCare® Patient Information ©2015 [employer]. This information is not intended to replace advice given to you by your health care provider. Make sure you discuss any questions you have with your health care provider. ° ° °

## 2014-08-02 NOTE — Progress Notes (Signed)
Uneventful injection of Prolia. Pt states she had received it 6 month's ago and no problems. Pt was discharged after 15 minutes after injection since it was her first visit at short stay

## 2014-09-20 DIAGNOSIS — Z6827 Body mass index (BMI) 27.0-27.9, adult: Secondary | ICD-10-CM | POA: Diagnosis not present

## 2014-09-20 DIAGNOSIS — Z01419 Encounter for gynecological examination (general) (routine) without abnormal findings: Secondary | ICD-10-CM | POA: Diagnosis not present

## 2014-09-28 DIAGNOSIS — H1013 Acute atopic conjunctivitis, bilateral: Secondary | ICD-10-CM | POA: Diagnosis not present

## 2014-09-28 DIAGNOSIS — H40033 Anatomical narrow angle, bilateral: Secondary | ICD-10-CM | POA: Diagnosis not present

## 2014-11-13 ENCOUNTER — Ambulatory Visit (INDEPENDENT_AMBULATORY_CARE_PROVIDER_SITE_OTHER): Payer: Medicare Other | Admitting: Cardiovascular Disease

## 2014-11-13 ENCOUNTER — Encounter: Payer: Self-pay | Admitting: Cardiovascular Disease

## 2014-11-13 VITALS — BP 164/100 | HR 66 | Ht 61.0 in | Wt 149.2 lb

## 2014-11-13 DIAGNOSIS — I10 Essential (primary) hypertension: Secondary | ICD-10-CM

## 2014-11-13 DIAGNOSIS — Z79899 Other long term (current) drug therapy: Secondary | ICD-10-CM | POA: Diagnosis not present

## 2014-11-13 DIAGNOSIS — K219 Gastro-esophageal reflux disease without esophagitis: Secondary | ICD-10-CM | POA: Diagnosis not present

## 2014-11-13 DIAGNOSIS — R0789 Other chest pain: Secondary | ICD-10-CM

## 2014-11-13 DIAGNOSIS — E785 Hyperlipidemia, unspecified: Secondary | ICD-10-CM | POA: Diagnosis not present

## 2014-11-13 MED ORDER — LOSARTAN POTASSIUM 100 MG PO TABS: 100.0000 mg | ORAL_TABLET | Freq: Every day | ORAL | Status: DC

## 2014-11-13 NOTE — Patient Instructions (Signed)
Your physician recommends that you return for lab work fasting.  Your physician has recommended you make the following change in your medication: the losartan has been increased to 100 mg daily. A new [facility] to reflect this change.  Your physician recommends that you schedule a follow-up appointment in: 3 months.

## 2014-11-13 NOTE — Progress Notes (Signed)
Patient ID: ALAZAY LEICHT, female   DOB: 1940-08-05, 74 y.o.   MRN: [id number]     HPI: Ms. Leomia Blake is a 74 year old white female who is a former patient of Dr. Terance Ice.  I last saw her in December 2015.  She presents for 7 month follow-up evaluation  Ms. Steven has a history of hypertension, osteoporosis, GERD, and has undergone 2 prior cardiac catheterizations. Approximately 20 years ago she underwent initial cardiac catheterization by Dr. Melvern Banker and over 10 years ago  another catheterization by Dr. Tami Ribas.  She was told that her coronaries were normal.  An echo Doppler study in November 2013 showed normal systolic function, which with grade 1 diastolic dysfunction.  There was mild mitral regurgitation.  She has a history of hyperlipidemia but developed myalgias secondary to pravastatin, Lipitor and Crestor and has been able to tolerate Livalo which currently is at 4 mg.  When she had been seen by Dr. Rollene Fare.  She was on Diovan 80 mg in addition to [place].  At that time her blood pressure was normal in the 263 systolic.  When she was seen by me initially in 2014 she was not on the ARB therapy.  Her renal function was normal.  Her blood pressure was 785 systolically.  She cannot recall any side effects to ARB therapy.  She has undergone colonoscopy and endoscopy by Dr. Lucio Edward.  She also is now seeing Dr. Abner Greenspan for primary care and previously had seen Dr. Wayland Denis.  She has osteoporosis and has undergone injections.  Since I last saw her, she states she typically takes her blood pressure at home and these have run in the 1 5160 range.  She denies any exertional precipitation of chest pain and walks almost daily and remains active.  Several weeks ago, she experienced mild chest discomfort which lasted for several days and was nonexertional.  She has GERD and this has been controlled with Nexium 40 mg.  She has been taking losartan 50 mg and Maxide daily for blood pressure.  She  presents for evaluation.  Past Medical History  Diagnosis Date  . Hypertension   . Osteoporosis   . Hyperlipidemia   . GERD (gastroesophageal reflux disease)   . Internal hemorrhoids   . Diverticulosis   . Status post dilation of esophageal narrowing   . Vitamin D deficiency   . Hyperuricemia   . History of nuclear stress test 03/2012    bruce myoview; normal pattern of perfusion in all regions, post-stress EF 76%, no ECG changes; low risk scan   . Cataract     BIL  . Hiatal hernia   . Tubular adenoma of colon     Past Surgical History  Procedure Laterality Date  . Total hip arthroplasty Left   . Abdominal hysterectomy    . Umbilical hernia repair    . Lymph nodes removed  1990's    due to cat scratch fever  . Cardiac catheterization  1993    normal coronaries  . Cardiac catheterization  2003    normal coronaries  . Transthoracic echocardiogram  04/12/2012    EF 88-50%, grade 1 diastolic dysfunction; mild MR; normal PA pressure   . Colonoscopy    . Cataract extraction, bilateral    . Tonsillectomy      Allergies  Allergen Reactions  . Reclast [Zoledronic Acid] Other (See Comments)    Caused numbness in jaw and neck  . Boniva [Ibandronic Acid] Palpitations    Current Outpatient  Prescriptions  Medication Sig Dispense Refill  . aspirin 81 MG tablet Take 81 mg by mouth daily.    . calcium-vitamin D (OSCAL WITH D) 500-200 MG-UNIT per tablet Take 1 tablet by mouth daily.    Marland Kitchen esomeprazole (NEXIUM) 40 MG capsule Take 1 capsule (40 mg total) by mouth 2 (two) times daily before a meal. 60 capsule 11  . glycopyrrolate (ROBINUL) 1 MG tablet Take 1 tablet (1 mg total) by mouth 2 (two) times daily. 60 tablet 11  . LORazepam (ATIVAN) 0.5 MG tablet Take 0.5 mg by mouth as needed.     . Multiple Vitamins-Minerals (MULTIVITAMIN WITH MINERALS) tablet Take 1 tablet by mouth daily.    Marland Kitchen PATADAY 0.2 % SOLN Apply 1 drop to eye as needed. Use as directed  3  .  triamterene-hydrochlorothiazide (MAXZIDE) 75-50 MG per tablet Take 1 tablet by mouth daily.     No current facility-administered medications for this visit.    Socially she is married. 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 There is no tobacco use. There is no alcohol use. She has 4 children 6 grandchildren and 2 great-grandchildren.  Family History  Problem Relation Age of Onset  . Colon polyps Brother     x 2  . Heart attack Father   . Dementia Mother   . Stroke Mother   . Heart disease Maternal Grandmother   . Kidney disease Paternal Grandfather   . Heart Problems Brother   . Colon polyps Brother   . Colon cancer Neg Hx   . Esophageal cancer Neg Hx   . Stomach cancer Neg Hx   . Rectal cancer Neg Hx     ROS General: Negative; No fevers, chills, or night sweats; positive for fatigue HEENT: Negative; No changes in vision or hearing, sinus congestion, difficulty swallowing Pulmonary: Negative; No cough, wheezing, shortness of breath, hemoptysis Cardiovascular: Negative; No chest pain, presyncope, syncope, palpitations GI: Positive for GERD, and trolled seem 40 mg; No nausea, vomiting, diarrhea, or abdominal pain GU: Negative; No dysuria, hematuria, or difficulty voiding Musculoskeletal: Negative; no myalgias, joint pain, or weakness Positive for osteoporosis Hematologic/Oncology: Negative; no easy bruising, bleeding Endocrine: Negative; no heat/cold intolerance; no diabetes Neuro: Negative; no changes in balance, headaches Skin: Negative; No rashes or skin lesions Psychiatric: Negative; No behavioral problems, depression Sleep: Negative; No snoring, daytime sleepiness, hypersomnolence, bruxism, restless legs, hypnogognic hallucinations, no cataplexy Other comprehensive 14 point system review is negative.  PE BP 164/100 mmHg  Pulse 66  Ht 5' 1"  (1.549 m)  Wt 149 lb 3.2 oz (67.677 kg)  BMI 28.21 kg/m2  Repeat blood pressure by me was  162/86.  Wt Readings from Last 3 Encounters:  11/13/14 149 lb 3.2 oz (67.677 kg)  08/02/14 148 lb (67.132 kg)  07/18/14 148 lb (67.132 kg)   General: Alert, oriented, no distress.  Skin: normal turgor, no rashes HEENT: Normocephalic, atraumatic. Pupils round and reactive; sclera anicteric; Fundi no arteriolar narrowing hemorrhages or exudates the Nose without nasal septal hypertrophy Mouth/Parynx benign; Mallinpatti scale 2 Neck: No JVD, no carotid bruits with normal carotid upstroke Lungs: clear to ausculatation and percussion; no wheezing or rales Chest wall: Nontender to palpation Heart: RRR, s1 s2 normal; 1/6 systolic murmur left sternal border.  No diastolic murmur.  No rubs thrills or heaves. Abdomen: soft, nontender; no hepatosplenomehaly, BS+; abdominal aorta nontender and not dilated by palpation. Back: No CVA tenderness Pulses 2+ Extremities: no clubbinbg cyanosis or  edema, Homan's sign negative  Neurologic: grossly nonfocal Psychologic: Normal mood and affect  ECG (independently read by me): Normal sinus rhythm at 66 bpm.  Nonspecific ST changes.   December 2015 ECG (independently read by me): Normal sinus rhythm at 66 bpm.  Nonspecific ST changes.  December 2014 ECG: Sinus rhythm at 80 beats per minute. One isolated PVC. QTc interval 465 ms.  LABS:  BMP Latest Ref Rng 05/09/2014 08/10/2013 04/29/2007  Glucose 70 - 99 mg/dL 96 110(H) 130(H)  BUN 6 - 23 mg/dL 12 13 9   Creatinine 0.50 - 1.10 mg/dL 0.79 0.8 0.89  Sodium 135 - 145 mEq/L 138 138 136  Potassium 3.5 - 5.3 mEq/L 4.2 4.2 3.4(L)  Chloride 96 - 112 mEq/L 101 99 104  CO2 19 - 32 mEq/L 27 31 26   Calcium 8.4 - 10.5 mg/dL 9.9 10.0 7.9(L)   Hepatic Function Latest Ref Rng 05/09/2014 08/10/2013  Total Protein 6.0 - 8.3 g/dL 7.3 7.7  Albumin 3.5 - 5.2 g/dL 4.5 4.4  AST 0 - 37 U/L 20 19  ALT 0 - 35 U/L 15 18  Alk Phosphatase 39 - 117 U/L 54 62  Total Bilirubin 0.2 - 1.2 mg/dL 0.6 0.9  Bilirubin, Direct 0.0 - 0.3  mg/dL - 0.1   CBC Latest Ref Rng 05/09/2014 08/10/2013 04/29/2007  WBC 4.0 - 10.5 K/uL 7.4 6.7 10.4  Hemoglobin 12.0 - 15.0 g/dL 14.1 14.2 9.5(L)  Hematocrit 36.0 - 46.0 % 42.4 42.0 27.2(L)  Platelets 150 - 400 K/uL 342 317.0 188   Lab Results  Component Value Date   MCV 93.0 05/09/2014   MCV 93.1 08/10/2013   MCV 93.1 04/29/2007   Lab Results  Component Value Date   TSH 2.611 05/09/2014  No results found for: HGBA1C  Lipid Panel     Component Value Date/Time   CHOL 165 05/09/2014 1124   TRIG 168* 05/09/2014 1124   HDL 47 05/09/2014 1124   CHOLHDL 3.5 05/09/2014 1124   VLDL 34 05/09/2014 1124   LDLCALC 84 05/09/2014 1124   RADIOLOGY: No results found.   ASSESSMENT AND PLAN: Ms. Alianah Lofton is a 74 year old female who has had several episodes of chest pain in the past and has undergone 2 prior cardiac catheterizations and was told of having normal coronary arteries. A nuclear perfusion study in November 2013 was normal without scar or ischemia.  Remotely, she has been documented have grade 1 diastolic dysfunction with normal systolic function.  Her blood pressure today is elevated with stage II hypertension.  She has been taking losartan 50 mg daily and I will increase this to 100 mg.  She will continue taking her Maxide daily.  We discussed sodium restriction.  A complete set of blood work will be obtained in the fasting state.  In the past she had been on lipid-lowering therapy, but is not on this presently.  This may need to be reinstituted if her lipids are elevated.  I suspect her recent vague nonexertional chest pain which lasted for several days duration was most likely musculoskeletal in etiology.  She is not having any anginal type symptomatology.  Her GERD is controlled with Nexium.  Her weight is stable and her body mass index is 28.2 kg/m compatible with being mildly overweight.  She will monitor her blood pressure at home and if her blood pressure does not  significantly improved with increased dose of losartan.  She will contact our office.  I will see her in 2-3 months for  reevaluation or sooner if problems arise.  Time spent: 25 minutes  Troy Sine, MD, [facility] 11/13/2014 11:09 AM

## 2014-11-15 LAB — COMPREHENSIVE METABOLIC PANEL
ALK PHOS: 59 U/L (ref 39–117)
ALT: 18 U/L (ref 0–35)
AST: 19 U/L (ref 0–37)
Albumin: 3.9 g/dL (ref 3.5–5.2)
BUN: 17 mg/dL (ref 6–23)
CO2: 30 mEq/L (ref 19–32)
Calcium: 9.7 mg/dL (ref 8.4–10.5)
Chloride: 101 mEq/L (ref 96–112)
Creat: 0.8 mg/dL (ref 0.50–1.10)
Glucose, Bld: 104 mg/dL — ABNORMAL HIGH (ref 70–99)
Potassium: 4.5 mEq/L (ref 3.5–5.3)
Sodium: 143 mEq/L (ref 135–145)
TOTAL PROTEIN: 7 g/dL (ref 6.0–8.3)
Total Bilirubin: 0.5 mg/dL (ref 0.2–1.2)

## 2014-11-15 LAB — CBC
HCT: 41.4 % (ref 36.0–46.0)
HEMOGLOBIN: 13.7 g/dL (ref 12.0–15.0)
MCH: 30.9 pg (ref 26.0–34.0)
MCHC: 33.1 g/dL (ref 30.0–36.0)
MCV: 93.5 fL (ref 78.0–100.0)
MPV: 10.3 fL (ref 8.6–12.4)
Platelets: 314 10*3/uL (ref 150–400)
RBC: 4.43 MIL/uL (ref 3.87–5.11)
RDW: 13.7 % (ref 11.5–15.5)
WBC: 7.8 10*3/uL (ref 4.0–10.5)

## 2014-11-15 LAB — LIPID PANEL
Cholesterol: 222 mg/dL — ABNORMAL HIGH (ref 0–200)
HDL: 38 mg/dL — AB (ref >=46)
LDL Cholesterol: 128 mg/dL — ABNORMAL HIGH (ref 0–99)
TRIGLYCERIDES: 278 mg/dL — AB (ref <150)
Total CHOL/HDL Ratio: 5.8 Ratio
VLDL: 56 mg/dL — AB (ref 0–40)

## 2014-11-15 LAB — TSH: TSH: 2.898 u[IU]/mL (ref 0.350–4.500)

## 2014-11-23 ENCOUNTER — Other Ambulatory Visit: Payer: Self-pay | Admitting: *Deleted

## 2014-11-23 ENCOUNTER — Telehealth: Payer: Self-pay | Admitting: *Deleted

## 2014-11-23 DIAGNOSIS — R899 Unspecified abnormal finding in specimens from other organs, systems and tissues: Secondary | ICD-10-CM

## 2014-11-23 DIAGNOSIS — E785 Hyperlipidemia, unspecified: Secondary | ICD-10-CM

## 2014-11-23 DIAGNOSIS — Z79899 Other long term (current) drug therapy: Secondary | ICD-10-CM

## 2014-11-23 MED ORDER — EZETIMIBE 10 MG PO TABS: 10.0000 mg | ORAL_TABLET | Freq: Every day | ORAL | Status: DC

## 2014-11-23 NOTE — Telephone Encounter (Signed)
Patient notified of lab results and recommendations. She tells me that she has been off of the livalo for about 1 month due to lab cramps. [facility]. Lab slip sent to have lipids rechecked again in 8 weeks.

## 2014-11-23 NOTE — Telephone Encounter (Signed)
-----   Message from Troy Sine, MD sent at 11/20/2014  3:41 PM EDT ----- Lipids elevated ? Is she still taking livalo; could not take other statins;  If cannot take if not on then try zetia 10 mg

## 2015-01-09 DIAGNOSIS — E559 Vitamin D deficiency, unspecified: Secondary | ICD-10-CM | POA: Diagnosis not present

## 2015-01-12 ENCOUNTER — Other Ambulatory Visit: Payer: Self-pay | Admitting: Gastroenterology

## 2015-01-29 DIAGNOSIS — E785 Hyperlipidemia, unspecified: Secondary | ICD-10-CM | POA: Diagnosis not present

## 2015-01-29 DIAGNOSIS — R899 Unspecified abnormal finding in specimens from other organs, systems and tissues: Secondary | ICD-10-CM | POA: Diagnosis not present

## 2015-01-29 DIAGNOSIS — Z79899 Other long term (current) drug therapy: Secondary | ICD-10-CM | POA: Diagnosis not present

## 2015-01-30 LAB — COMPREHENSIVE METABOLIC PANEL
ALT: 13 U/L (ref 6–29)
AST: 16 U/L (ref 10–35)
Albumin: 4 g/dL (ref 3.6–5.1)
Alkaline Phosphatase: 52 U/L (ref 33–130)
BILIRUBIN TOTAL: 0.5 mg/dL (ref 0.2–1.2)
BUN: 19 mg/dL (ref 7–25)
CHLORIDE: 104 mmol/L (ref 98–110)
CO2: 29 mmol/L (ref 20–31)
Calcium: 9.7 mg/dL (ref 8.6–10.4)
Creat: 0.82 mg/dL (ref 0.60–0.93)
GLUCOSE: 94 mg/dL (ref 65–99)
Potassium: 4.8 mmol/L (ref 3.5–5.3)
SODIUM: 140 mmol/L (ref 135–146)
Total Protein: 6.4 g/dL (ref 6.1–8.1)

## 2015-01-30 LAB — LIPID PANEL
Cholesterol: 191 mg/dL (ref 125–200)
HDL: 40 mg/dL — AB (ref >=46)
LDL Cholesterol: 105 mg/dL (ref <130)
Total CHOL/HDL Ratio: 4.8 Ratio (ref <=5.0)
Triglycerides: 228 mg/dL — ABNORMAL HIGH (ref <150)
VLDL: 46 mg/dL — AB (ref <30)

## 2015-02-07 ENCOUNTER — Ambulatory Visit (HOSPITAL_COMMUNITY): Payer: Medicare Other

## 2015-02-13 ENCOUNTER — Encounter: Payer: Self-pay | Admitting: Cardiovascular Disease

## 2015-02-13 ENCOUNTER — Ambulatory Visit (INDEPENDENT_AMBULATORY_CARE_PROVIDER_SITE_OTHER): Payer: Medicare Other | Admitting: Cardiovascular Disease

## 2015-02-13 VITALS — BP 138/78 | HR 62 | Ht 61.0 in | Wt 149.1 lb

## 2015-02-13 DIAGNOSIS — R0789 Other chest pain: Secondary | ICD-10-CM | POA: Diagnosis not present

## 2015-02-13 DIAGNOSIS — I10 Essential (primary) hypertension: Secondary | ICD-10-CM

## 2015-02-13 DIAGNOSIS — E785 Hyperlipidemia, unspecified: Secondary | ICD-10-CM

## 2015-02-13 DIAGNOSIS — K219 Gastro-esophageal reflux disease without esophagitis: Secondary | ICD-10-CM

## 2015-02-13 NOTE — Patient Instructions (Signed)
Your physician has recommended you make the following change in your medication: Dr. Claiborne Billings recommends that you take fish oil. 1-2 capsules daily 1000 mg.   Your physician wants you to follow-up in: 1 year or sooner if needed. You will receive a reminder letter in the mail two months in advance. If you don't receive a letter, please call our office to schedule the follow-up appointment.

## 2015-02-13 NOTE — Progress Notes (Signed)
Patient ID: Angela Preston, female   DOB: 1941/03/08, 74 y.o.   MRN: [id number]    HPI: [name] is a 74 year old white female who is a former patient of Dr. Terance Ice.   She presents for a three-month follow-up cardiology evaluation.  Ms. [name] has a history of hypertension, osteoporosis, GERD, and has undergone 2 prior cardiac catheterizations. Approximately 20 years ago she underwent initial cardiac catheterization by Dr. Melvern Banker and over 10 years ago  another catheterization by Dr. Tami Ribas.  She was told that her coronaries were normal.  An echo Doppler study in November 2013 showed normal systolic function, which with grade 1 diastolic dysfunction.  There was mild mitral regurgitation.  She has a history of hyperlipidemia but developed myalgias secondary to pravastatin, Lipitor and Crestor and has been able to tolerate Livalo which currently is at 4 mg.  When she had been seen by Dr. Rollene Fare.  She was on Diovan 80 mg in addition to [place].  At that time her blood pressure was normal in the 540 systolic.  When she was seen by me initially in 2014 she was not on the ARB therapy.  Her renal function was normal.  Her blood pressure was 086 systolically.  She cannot recall any side effects to ARB therapy.  She has undergone colonoscopy and endoscopy by Dr. Lucio Edward.  She was seeing Dr. Abner Greenspan for primary care and previously had seen Dr. Wayland Denis.   However, she tells me she will be starting to see Dr. Raoul Pitch at [facility], for primary care next month. She has osteoporosis and has undergone injections.   I had recently seen her 3 months ago at which time her blood pressure was elevated with stage II hypertension. I titrated her losartan from 50 mg to 100 mg.  We discussed sodium restriction.  She also has continued to take her Maxide.  She was having vague nonexertional chest pain and was most likely musculoskeletal in etiology.  She has continued to take GERD treatment with Nexium.   She presents now for follow-up evaluation.  Past Medical History  Diagnosis Date  . Hypertension   . Osteoporosis   . Hyperlipidemia   . GERD (gastroesophageal reflux disease)   . Internal hemorrhoids   . Diverticulosis   . Status post dilation of esophageal narrowing   . Vitamin D deficiency   . Hyperuricemia   . History of nuclear stress test 03/2012    bruce myoview; normal pattern of perfusion in all regions, post-stress EF 76%, no ECG changes; low risk scan   . Cataract     BIL  . Hiatal hernia   . Tubular adenoma of colon     Past Surgical History  Procedure Laterality Date  . Total hip arthroplasty Left   . Abdominal hysterectomy    . Umbilical hernia repair    . Lymph nodes removed  1990's    due to cat scratch fever  . Cardiac catheterization  1993    normal coronaries  . Cardiac catheterization  2003    normal coronaries  . Transthoracic echocardiogram  04/12/2012    EF 76-19%, grade 1 diastolic dysfunction; mild MR; normal PA pressure   . Colonoscopy    . Cataract extraction, bilateral    . Tonsillectomy      Allergies  Allergen Reactions  . Reclast [Zoledronic Acid] Other (See Comments)    Caused numbness in jaw and neck  . Boniva [Ibandronic Acid] Palpitations    Current Outpatient  Prescriptions  Medication Sig Dispense Refill  . aspirin 81 MG tablet Take 81 mg by mouth daily.    . calcium-vitamin D (OSCAL WITH D) 500-200 MG-UNIT per tablet Take 1 tablet by mouth daily.    Marland Kitchen esomeprazole (NEXIUM) 40 MG capsule Take 1 capsule (40 mg total) by mouth 2 (two) times daily before a meal. 60 capsule 11  . ezetimibe (ZETIA) 10 MG tablet Take 1 tablet (10 mg total) by mouth daily. 30 tablet 3  . glycopyrrolate (ROBINUL) 1 MG tablet TAKE 1 TABLET (1 MG TOTAL) BY MOUTH 2 (TWO) TIMES DAILY. 60 tablet 3  . LORazepam (ATIVAN) 0.5 MG tablet Take 0.5 mg by mouth as needed.     Marland Kitchen losartan (COZAAR) 100 MG tablet Take 1 tablet (100 mg total) by mouth daily. 30 tablet 6   . Multiple Vitamins-Minerals (MULTIVITAMIN WITH MINERALS) tablet Take 1 tablet by mouth daily.    Marland Kitchen PATADAY 0.2 % SOLN Apply 1 drop to eye as needed. Use as directed  3  . triamterene-hydrochlorothiazide (MAXZIDE) 75-50 MG per tablet Take 1 tablet by mouth daily.     No current facility-administered medications for this visit.    Socially she is married. She lives with her husband in a [address] performed and they have 18 cows. There is no tobacco use. There is no alcohol use. She has 4 children 6 grandchildren and 2 great-grandchildren.  Family History  Problem Relation Age of Onset  . Colon polyps Brother     x 2  . Heart attack Father   . Dementia Mother   . Stroke Mother   . Heart disease Maternal Grandmother   . Kidney disease Paternal Grandfather   . Heart Problems Brother   . Colon polyps Brother   . Colon cancer Neg Hx   . Esophageal cancer Neg Hx   . Stomach cancer Neg Hx   . Rectal cancer Neg Hx     ROS General: Negative; No fevers, chills, or night sweats; positive for fatigue HEENT: Negative; No changes in vision or hearing, sinus congestion, difficulty swallowing Pulmonary: Negative; No cough, wheezing, shortness of breath, hemoptysis Cardiovascular: Negative; No chest pain, presyncope, syncope, palpitations GI: Positive for GERD, and trolled seem 40 mg; No nausea, vomiting, diarrhea, or abdominal pain GU: Negative; No dysuria, hematuria, or difficulty voiding Musculoskeletal: Negative; no myalgias, joint pain, or weakness Positive for osteoporosis Hematologic/Oncology: Negative; no easy bruising, bleeding Endocrine: Negative; no heat/cold intolerance; no diabetes Neuro: Negative; no changes in balance, headaches Skin: Negative; No rashes or skin lesions Psychiatric: Negative; No behavioral problems, depression Sleep: Negative; No snoring, daytime sleepiness, hypersomnolence, bruxism, restless legs, hypnogognic hallucinations, no cataplexy Other comprehensive 14 point  system review is negative.  PE BP 138/78 mmHg  Pulse 62  Ht 5' 1"  (1.549 m)  Wt 149 lb 1.6 oz (67.631 kg)  BMI 28.19 kg/m2  Repeat blood pressure by me was 130/76.  Wt Readings from Last 3 Encounters:  02/13/15 149 lb 1.6 oz (67.631 kg)  11/13/14 149 lb 3.2 oz (67.677 kg)  08/02/14 148 lb (67.132 kg)   General: Alert, oriented, no distress.  Skin: normal turgor, no rashes HEENT: Normocephalic, atraumatic. Pupils round and reactive; sclera anicteric; Fundi no arteriolar narrowing hemorrhages or exudates the Nose without nasal septal hypertrophy Mouth/Parynx benign; Mallinpatti scale 2 Neck: No JVD, no carotid bruits with normal carotid upstroke Lungs: clear to ausculatation and percussion; no wheezing or rales Chest wall: Nontender to palpation Heart: RRR, s1 s2 normal; 1/6  systolic murmur left sternal border.  No diastolic murmur.  No rubs thrills or heaves. Abdomen: soft, nontender; no hepatosplenomehaly, BS+; abdominal aorta nontender and not dilated by palpation. Back: No CVA tenderness Pulses 2+ Extremities: no clubbinbg cyanosis or edema, Homan's sign negative  Neurologic: grossly nonfocal Psychologic: Normal mood and affect  ECG (independently read by me):  Normal sinus rhythm at 62 bpm.  Nonspecific T changes..  Normal intervals.  June 2016ECG (independently read by me): Normal sinus rhythm at 66 bpm.  Nonspecific ST changes.   December 2015 ECG (independently read by me): Normal sinus rhythm at 66 bpm.  Nonspecific ST changes.  December 2014 ECG: Sinus rhythm at 80 beats per minute. One isolated PVC. QTc interval 465 ms.  LABS:  BMP Latest Ref Rng 01/29/2015 11/13/2014 05/09/2014  Glucose 65 - 99 mg/dL 94 104(H) 96  BUN 7 - 25 mg/dL 19 17 12   Creatinine 0.60 - 0.93 mg/dL 0.82 0.80 0.79  Sodium 135 - 146 mmol/L 140 143 138  Potassium 3.5 - 5.3 mmol/L 4.8 4.5 4.2  Chloride 98 - 110 mmol/L 104 101 101  CO2 20 - 31 mmol/L 29 30 27   Calcium 8.6 - 10.4 mg/dL 9.7 9.7  9.9   Hepatic Function Latest Ref Rng 01/29/2015 11/13/2014 05/09/2014  Total Protein 6.1 - 8.1 g/dL 6.4 7.0 7.3  Albumin 3.6 - 5.1 g/dL 4.0 3.9 4.5  AST 10 - 35 U/L 16 19 20   ALT 6 - 29 U/L 13 18 15   Alk Phosphatase 33 - 130 U/L 52 59 54  Total Bilirubin 0.2 - 1.2 mg/dL 0.5 0.5 0.6  Bilirubin, Direct 0.0 - 0.3 mg/dL - - -   CBC Latest Ref Rng 11/13/2014 05/09/2014 08/10/2013  WBC 4.0 - 10.5 K/uL 7.8 7.4 6.7  Hemoglobin 12.0 - 15.0 g/dL 13.7 14.1 14.2  Hematocrit 36.0 - 46.0 % 41.4 42.4 42.0  Platelets 150 - 400 K/uL 314 342 317.0   Lab Results  Component Value Date   MCV 93.5 11/13/2014   MCV 93.0 05/09/2014   MCV 93.1 08/10/2013   Lab Results  Component Value Date   TSH 2.898 11/13/2014  No results found for: HGBA1C  Lipid Panel     Component Value Date/Time   CHOL 191 01/29/2015 0913   TRIG 228* 01/29/2015 0913   HDL 40* 01/29/2015 0913   CHOLHDL 4.8 01/29/2015 0913   VLDL 46* 01/29/2015 0913   LDLCALC 105 01/29/2015 0913   RADIOLOGY: No results found.   ASSESSMENT AND PLAN: Ms. Laresha Bacorn is a 74 year old female who has has a history of hypertension , GERD and has had several episodes of chest pain in the past. Two prior cardiac catheterizations revealed normal coronary arteries. A nuclear perfusion study in November 2013 was normal without scar or ischemia.  [name] 3 months ago, she had stage II hypertension.  I further titrated her losartan to 100 mg daily which is tolerated well.  She is continued to take Maxide 75/50 daily.  Her blood pressure today is improved.  She is on Zetia for hyperlipidemia.  I have reviewed recent laboratory which have shown increased triglycerides at 228 and VLDL at 46.  I have recommended the addition of omega-3 fatty acids 2 capsules daily to her medical regimen.  We discussed the importance of continued exercise at least 5 days a week for 30 minutes at a time.  Her GERD is controlled with Nexium.  She will be establishing with a  new physician  for primary care.  As long as she remains stable, I will see her one year for reevaluation.  Time spent: 25 minutes  Troy Sine, MD, [facility] 02/13/2015 7:12 PM

## 2015-02-15 ENCOUNTER — Ambulatory Visit (HOSPITAL_COMMUNITY)
Admission: RE | Admit: 2015-02-15 | Discharge: 2015-02-15 | Disposition: A | Discharge status: Home / Self Care | Payer: Medicare Other | Source: Ambulatory Visit | Attending: Internal Medicine | Admitting: Internal Medicine

## 2015-02-15 ENCOUNTER — Encounter (HOSPITAL_COMMUNITY): Payer: Self-pay

## 2015-02-15 DIAGNOSIS — M810 Age-related osteoporosis without current pathological fracture: Secondary | ICD-10-CM | POA: Diagnosis not present

## 2015-02-15 MED ORDER — DENOSUMAB 60 MG/ML ~~LOC~~ SOLN
60.0000 mg | Freq: Once | SUBCUTANEOUS | Status: AC
  Administered 2015-02-15: 60 mg via SUBCUTANEOUS
  Filled 2015-02-15: qty 1

## 2015-02-15 NOTE — Discharge Instructions (Signed)
Denosumab injection What is this medicine? DENOSUMAB (den oh sue mab) slows bone breakdown. Prolia is used to treat osteoporosis in women after menopause and in men. Xgeva is used to prevent bone fractures and other bone problems caused by cancer bone metastases. Xgeva is also used to treat giant cell tumor of the bone. This medicine may be used for other purposes; ask your health care provider or pharmacist if you have questions. COMMON BRAND NAME(S): Prolia, XGEVA What should I tell my health care provider before I take this medicine? They need to know if you have any of these conditions: -dental disease -eczema -infection or history of infections -kidney disease or on dialysis -low blood calcium or vitamin D -malabsorption syndrome -scheduled to have surgery or tooth extraction -taking medicine that contains denosumab -thyroid or parathyroid disease -an unusual reaction to denosumab, other medicines, foods, dyes, or preservatives -pregnant or trying to get pregnant -breast-feeding How should I use this medicine? This medicine is for injection under the skin. It is given by a health care professional in a hospital or clinic setting. If you are getting Prolia, a special MedGuide will be given to you by the pharmacist with each prescription and refill. Be sure to read this information carefully each time. For Prolia, talk to your pediatrician regarding the use of this medicine in children. Special care may be needed. For Xgeva, talk to your pediatrician regarding the use of this medicine in children. While this drug may be prescribed for children as young as 13 years for selected conditions, precautions do apply. Overdosage: If you think you've taken too much of this medicine contact a poison control center or emergency room at once. Overdosage: If you think you have taken too much of this medicine contact a poison control center or emergency room at once. NOTE: This medicine is only for  you. Do not share this medicine with others. What if I miss a dose? It is important not to miss your dose. Call your doctor or health care professional if you are unable to keep an appointment. What may interact with this medicine? Do not take this medicine with any of the following medications: -other medicines containing denosumab This medicine may also interact with the following medications: -medicines that suppress the immune system -medicines that treat cancer -steroid medicines like prednisone or cortisone This list may not describe all possible interactions. Give your health care provider a list of all the medicines, herbs, non-prescription drugs, or dietary supplements you use. Also tell them if you smoke, drink alcohol, or use illegal drugs. Some items may interact with your medicine. What should I watch for while using this medicine? Visit your doctor or health care professional for regular checks on your progress. Your doctor or health care professional may order blood tests and other tests to see how you are doing. Call your doctor or health care professional if you get a cold or other infection while receiving this medicine. Do not treat yourself. This medicine may decrease your body's ability to fight infection. You should make sure you get enough calcium and vitamin D while you are taking this medicine, unless your doctor tells you not to. Discuss the foods you eat and the vitamins you take with your health care professional. See your dentist regularly. Brush and floss your teeth as directed. Before you have any dental work done, tell your dentist you are receiving this medicine. Do not become pregnant while taking this medicine or for 5 months after stopping   it. Women should inform their doctor if they wish to become pregnant or think they might be pregnant. There is a potential for serious side effects to an unborn child. Talk to your health care professional or pharmacist for more  information. What side effects may I notice from receiving this medicine? Side effects that you should report to your doctor or health care professional as soon as possible: -allergic reactions like skin rash, itching or hives, swelling of the face, lips, or tongue -breathing problems -chest pain -fast, irregular heartbeat -feeling faint or lightheaded, falls -fever, chills, or any other sign of infection -muscle spasms, tightening, or twitches -numbness or tingling -skin blisters or bumps, or is dry, peels, or red -slow healing or unexplained pain in the mouth or jaw -unusual bleeding or bruising Side effects that usually do not require medical attention (Report these to your doctor or health care professional if they continue or are bothersome.): -muscle pain -stomach upset, gas This list may not describe all possible side effects. Call your doctor for medical advice about side effects. You may report side effects to FDA at 1-800-FDA-1088. Where should I keep my medicine? This medicine is only given in a clinic, doctor's office, or other health care setting and will not be stored at home. NOTE: This sheet is a summary. It may not cover all possible information. If you have questions about this medicine, talk to your doctor, pharmacist, or health care provider.  2015, Elsevier/Gold Standard. (2011-11-03 12:37:47)  

## 2015-02-21 ENCOUNTER — Ambulatory Visit (INDEPENDENT_AMBULATORY_CARE_PROVIDER_SITE_OTHER): Payer: Medicare Other | Admitting: Family Medicine

## 2015-02-21 ENCOUNTER — Encounter: Payer: Self-pay | Admitting: Family Medicine

## 2015-02-21 VITALS — BP 147/88 | HR 65 | Temp 97.7°F | Resp 18 | Ht 61.5 in | Wt 146.0 lb

## 2015-02-21 DIAGNOSIS — Z860100 Personal history of colon polyps, unspecified: Secondary | ICD-10-CM

## 2015-02-21 DIAGNOSIS — N182 Chronic kidney disease, stage 2 (mild): Secondary | ICD-10-CM

## 2015-02-21 DIAGNOSIS — M810 Age-related osteoporosis without current pathological fracture: Secondary | ICD-10-CM

## 2015-02-21 DIAGNOSIS — I10 Essential (primary) hypertension: Secondary | ICD-10-CM | POA: Diagnosis not present

## 2015-02-21 DIAGNOSIS — Z0000 Encounter for general adult medical examination without abnormal findings: Secondary | ICD-10-CM

## 2015-02-21 DIAGNOSIS — E785 Hyperlipidemia, unspecified: Secondary | ICD-10-CM

## 2015-02-21 DIAGNOSIS — K219 Gastro-esophageal reflux disease without esophagitis: Secondary | ICD-10-CM | POA: Diagnosis not present

## 2015-02-21 HISTORY — DX: Personal history of colonic polyps: Z86.010

## 2015-02-21 HISTORY — DX: Personal history of colon polyps, unspecified: Z86.0100

## 2015-02-21 MED ORDER — TRIAMTERENE-HCTZ 75-50 MG PO TABS: 1.0000 | ORAL_TABLET | Freq: Every day | ORAL | Status: DC

## 2015-02-21 NOTE — Progress Notes (Signed)
Subjective:    Patient ID: Angela Preston, female    DOB: [birth year], 74 y.o.   MRN: [id number]  HPI Patient presents for new patient visit, with follow-up on multiple chronic illnesses.  All past medical history, surgical history, allergies, family history, immunizations, medications and social history was obtained from the patient today and entered into the electronic medical record. Records are requested from her prior PCP, and will be reviewed at the time they are received. All medical records will be updated at that time. Prior labs and imaging studies available were reviewed in medical medical record with patient today.  1. History of colonic polyps: No family history of colon cancer. Last colonoscopy: 07/2014, every 3 year follow up recommended.   2. Osteoporosis: Patient is uncertain when her last bone density scan was, she thinks this was within the last 2-3 years. She states [facility] through [facility], and she will have records faxed to our office. Patient is currently taking calcium and vitamin D supplementation daily. She is receiving Prolia subcutaneous injections every 6 months. She has received 3 prolia injections so far. She received her last injection on 02/15/2015. Patient reports she receives her injections [facility]. She has seen Dr.Perini, in the past for her osteoporosis. She states that she was started on Boniva about 6 years ago after being found osteoporotic by DEXA, and had a bad reaction to [place]. She was then tried on Reclast, and had an allergic reaction to it as well. She has been tolerating the Prolia injections without complications.  3. Gastroesophageal reflux disease without esophagitis: Patient reports she has been on Nexium for approximately 4 years. She reports a few years ago trying to stop the Nexium, and her symptoms return. Patient does not follow a GERD diet. She currently takes Nexium approximately 3 times in a  week.   4/5. Essential hypertension/atypical chest pain/hyperlipidemia: Patient follows with cardiology, Dr.Kelly, who has been managing her hypertension, hyperlipidemia and atypical chest pain. Patient underwent a cardiac catheterization on 2 occasions due to atypical chest pain, both with normal study/coronary arteries. A nuclear perfusion study in November 2013 was normal without scar or ischemia. She has had changes to her medications with the past 4 months, secondary to uncontrolled hypertension. She currently is on Maxzide 75-50 milligrams a day losartan 100 mg daily. She was placed on a new cholesterol med as well, she is tolerating Zetia well.She's does not routinely monitor her blood pressures at home. She does exercise at least 3 times a week, attempts to low-salt diet. CK D stage II with GFR 61.  6.Health maintenance:  Colonoscopy: UTD, every 3 years, hx polyps, no fhx Mammogram:UTD 2015, patient reports receiving every year, WNL. Would continue 1-2 year mammograms despite age, family history of breast cancer in her daughter. Cervical cancer screening: Not indicated, hysterectomy Immunizations:  flu shot schedule next week. Tetanus 2013, Pneumovax 8676 (uncertain which and if both after 74 yo), records requested Infectious disease screening: records requested, HIV screening  indicated if desired.  DEXA: may need repeat, unknown last DEXA (within last few years) and patient with osteoporosis . Requested records.   Review of Systems Negative, with the exception of above mentioned in HPI      Objective:   Physical Exam BP 147/88 mmHg  Pulse 65  Temp(Src) 97.7 F (36.5 C) (Oral)  Resp 18  Ht 5' 1.5" (1.562 m)  Wt 146 lb (66.225 kg)  BMI 27.14 kg/m2  SpO2 94%  Gen: Afebrile. No acute distress. Nontoxic in appearance, well-developed, well-nourished, Caucasian female, very pleasant, physically fit. HENT: AT. [place].  MMM. Throat without erythema or exudates. No oral lesions. Good  dentition. Eyes:Pupils Equal Round Reactive to light, Extraocular movements intact,  Conjunctiva without redness, discharge or icterus. Neck/lymp/endocrine: Supple, no lymphadenopathy, no thyromegaly CV: RRR no murmur appreciated, no edema, +2/4 P posterior tibialis pulses Chest: CTAB, no wheeze or crackles Abd: Soft. Flat. NTND. BS present. No Masses palpated.  Skin: No rashes, purpura or petechiae.  Neuro: Normal gait. PERLA. EOMi. Alert. Oriented x3  Psych: Normal affect, dress and demeanor. Normal speech. Normal thought content and judgment.     Assessment & Plan:  1. History of colonic polyps - No family history of colon cancer.  - Last colonoscopy: 07/2014, next due in 3 years (07/2017)  2. Osteoporosis - DEXA: may need repeat, unknown last DEXA (within last few years) and patient with osteoporosis . Requested records.  - Continue Prolia q 6 months (next due August 15, 2015), will need CMP prior.  - will attempt to order Prolia and patient can receive injection in the office, if able.  - Continue calcium and vitamin D supplementation, patient should be taking at least 800 units of vitamin D daily.  3. Gastroesophageal reflux disease without esophagitis - Nexium as needed. Would like to try to wean off medication if able. Currently using about 3 times a week. Advised her to attempt to do without, unless absolutely needed.  - Pt encouraged to continue GERD diet, avoiding triggers, late night meals/laying down after eating.  4. 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  - advised pt to monitor BP and if consistently above 140/80 then we would need to revise medications.  - Low salt, healthy diet with fresh fruit/veggies and lean meats.  - 150 minutes a week of exercise.  - Continue Maxzide 75-50 and losartan 100 mg daily.  5. Hyperlipidemia - Monitor yearly. Next due 01/2016. - Continue zetia  6.Health maintenance:  Colonoscopy: UTD,  every 3 years, hx polyps, no fhx Mammogram:UTD 2015, patient reports receiving every year, WNL. Would continue 1-2 year mammograms despite age, family history of breast cancer in her daughter. Cervical cancer screening: Not indicated, hysterectomy Immunizations:  flu shot schedule next week. Tetanus 2013, Pneumovax 1610 (uncertain which and if both after 75 yo), records requested Infectious disease screening: records requested, HIV screening  indicated if desired.  DEXA: may need repeat, unknown last DEXA (within last few years) and patient with osteoporosis . Requested records.   7. CKD stage 2, GFR 60-89: - Monitor closely. Patient on Prolia for OP.  - GFR > 30 or prolia contraindicated.   Follow-up 6 months, records have been requested any vaccinations are needed will be offered at that time.  Greater than 45 minutes was spent with patient, greater than 50% of that time was spent face-to-face with patient counseling and coordinating care.

## 2015-02-21 NOTE — Patient Instructions (Signed)
Health Maintenance, Female Adopting a healthy lifestyle and getting preventive care can go a long way to promote health and wellness. Talk with your health care provider about what schedule of regular examinations is right for you. This is a good chance for you to check in with your provider about disease prevention and staying healthy. In between checkups, there are plenty of things you can do on your own. Experts have done a lot of research about which lifestyle changes and preventive measures are most likely to keep you healthy. Ask your health care provider for more information. WEIGHT AND DIET  Eat a healthy diet  Be sure to include plenty of vegetables, fruits, low-fat dairy products, and lean protein.  Do not eat a lot of foods high in solid fats, added sugars, or salt.  Get regular exercise. This is one of the most important things you can do for your health.  Most adults should exercise for at least 150 minutes each week. The exercise should increase your heart rate and make you sweat (moderate-intensity exercise).  Most adults should also do strengthening exercises at least twice a week. This is in addition to the moderate-intensity exercise.  Maintain a healthy weight  Body mass index (BMI) is a measurement that can be used to identify possible weight problems. It estimates body fat based on height and weight. Your health care provider can help determine your BMI and help you achieve or maintain a healthy weight.  For females 20 years of age and older:   A BMI below 18.5 is considered underweight.  A BMI of 18.5 to 24.9 is normal.  A BMI of 25 to 29.9 is considered overweight.  A BMI of 30 and above is considered obese.  Watch levels of cholesterol and blood lipids  You should start having your blood tested for lipids and cholesterol at 75 years of age, then have this test every 5 years.  You may need to have your cholesterol levels checked more often if:  Your lipid  or cholesterol levels are high.  You are older than 75 years of age.  You are at high risk for heart disease.  CANCER SCREENING   Lung Cancer  Lung cancer screening is recommended for adults 55-80 years old who are at high risk for lung cancer because of a history of smoking.  A yearly low-dose CT scan of the lungs is recommended for people who:  Currently smoke.  Have quit within the past 15 years.  Have at least a 30-pack-year history of smoking. A pack year is smoking an average of one pack of cigarettes a day for 1 year.  Yearly screening should continue until it has been 15 years since you quit.  Yearly screening should stop if you develop a health problem that would prevent you from having lung cancer treatment.  Breast Cancer  Practice breast self-awareness. This means understanding how your breasts normally appear and feel.  It also means doing regular breast self-exams. Let your health care provider know about any changes, no matter how small.  If you are in your 20s or 30s, you should have a clinical breast exam (CBE) by a health care provider every 1-3 years as part of a regular health exam.  If you are 40 or older, have a CBE every year. Also consider having a breast X-ray (mammogram) every year.  If you have a family history of breast cancer, talk to your health care provider about genetic screening.  If you   are at high risk for breast cancer, talk to your health care provider about having an MRI and a mammogram every year.  Breast cancer gene (BRCA) assessment is recommended for women who have family members with BRCA-related cancers. BRCA-related cancers include:  Breast.  Ovarian.  Tubal.  Peritoneal cancers.  Results of the assessment will determine the need for genetic counseling and BRCA1 and BRCA2 testing. Cervical Cancer Your health care provider may recommend that you be screened regularly for cancer of the pelvic organs (ovaries, uterus, and  vagina). This screening involves a pelvic examination, including checking for microscopic changes to the surface of your cervix (Pap test). You may be encouraged to have this screening done every 3 years, beginning at age 21.  For women ages 30-65, health care providers may recommend pelvic exams and Pap testing every 3 years, or they may recommend the Pap and pelvic exam, combined with testing for human papilloma virus (HPV), every 5 years. Some types of HPV increase your risk of cervical cancer. Testing for HPV may also be done on women of any age with unclear Pap test results.  Other health care providers may not recommend any screening for nonpregnant women who are considered low risk for pelvic cancer and who do not have symptoms. Ask your health care provider if a screening pelvic exam is right for you.  If you have had past treatment for cervical cancer or a condition that could lead to cancer, you need Pap tests and screening for cancer for at least 20 years after your treatment. If Pap tests have been discontinued, your risk factors (such as having a new sexual partner) need to be reassessed to determine if screening should resume. Some women have medical problems that increase the chance of getting cervical cancer. In these cases, your health care provider may recommend more frequent screening and Pap tests. Colorectal Cancer  This type of cancer can be detected and often prevented.  Routine colorectal cancer screening usually begins at 75 years of age and continues through 75 years of age.  Your health care provider may recommend screening at an earlier age if you have risk factors for colon cancer.  Your health care provider may also recommend using home test kits to check for hidden blood in the stool.  A small camera at the end of a tube can be used to examine your colon directly (sigmoidoscopy or colonoscopy). This is done to check for the earliest forms of colorectal  cancer.  Routine screening usually begins at age 50.  Direct examination of the colon should be repeated every 5-10 years through 75 years of age. However, you may need to be screened more often if early forms of precancerous polyps or small growths are found. Skin Cancer  Check your skin from head to toe regularly.  Tell your health care provider about any new moles or changes in moles, especially if there is a change in a mole's shape or color.  Also tell your health care provider if you have a mole that is larger than the size of a pencil eraser.  Always use sunscreen. Apply sunscreen liberally and repeatedly throughout the day.  Protect yourself by wearing long sleeves, pants, a wide-brimmed hat, and sunglasses whenever you are outside. HEART DISEASE, DIABETES, AND HIGH BLOOD PRESSURE   High blood pressure causes heart disease and increases the risk of stroke. High blood pressure is more likely to develop in:  People who have blood pressure in the high end   of the normal range (130-139/85-89 mm Hg).  People who are overweight or obese.  People who are African American.  If you are 38-23 years of age, have your blood pressure checked every 3-5 years. If you are 61 years of age or older, have your blood pressure checked every year. You should have your blood pressure measured twice--once when you are at a hospital or clinic, and once when you are not at a hospital or clinic. Record the average of the two measurements. To check your blood pressure when you are not at a hospital or clinic, you can use:  An automated blood pressure machine at a pharmacy.  A home blood pressure monitor.  If you are between 45 years and 39 years old, ask your health care provider if you should take aspirin to prevent strokes.  Have regular diabetes screenings. This involves taking a blood sample to check your fasting blood sugar level.  If you are at a normal weight and have a low risk for diabetes,  have this test once every three years after 75 years of age.  If you are overweight and have a high risk for diabetes, consider being tested at a younger age or more often. PREVENTING INFECTION  Hepatitis B  If you have a higher risk for hepatitis B, you should be screened for this virus. You are considered at high risk for hepatitis B if:  You were born in a country where hepatitis B is common. Ask your health care provider which countries are considered high risk.  Your parents were born in a high-risk country, and you have not been immunized against hepatitis B (hepatitis B vaccine).  You have HIV or AIDS.  You use needles to inject street drugs.  You live with someone who has hepatitis B.  You have had sex with someone who has hepatitis B.  You get hemodialysis treatment.  You take certain medicines for conditions, including cancer, organ transplantation, and autoimmune conditions. Hepatitis C  Blood testing is recommended for:  Everyone born from [birth year] through 1966.  Anyone with known risk factors for hepatitis C. Sexually transmitted infections (STIs)  You should be screened for sexually transmitted infections (STIs) including gonorrhea and chlamydia if:  You are sexually active and are younger than 75 years of age.  You are older than 75 years of age and your health care provider tells you that you are at risk for this type of infection.  Your sexual activity has changed since you were last screened and you are at an increased risk for chlamydia or gonorrhea. Ask your health care provider if you are at risk.  If you do not have HIV, but are at risk, it may be recommended that you take a prescription medicine daily to prevent HIV infection. This is called pre-exposure prophylaxis (PrEP). You are considered at risk if:  You are sexually active and do not regularly use condoms or know the HIV status of your partner(s).  You take drugs by injection.  You are sexually  active with a partner who has HIV. Talk with your health care provider about whether you are at high risk of being infected with HIV. If you choose to begin PrEP, you should first be tested for HIV. You should then be tested every 3 months for as long as you are taking PrEP.  PREGNANCY   If you are premenopausal and you may become pregnant, ask your health care provider about preconception counseling.  If you may  become pregnant, take 400 to 800 micrograms (mcg) of folic acid every day.  If you want to prevent pregnancy, talk to your health care provider about birth control (contraception). OSTEOPOROSIS AND MENOPAUSE   Osteoporosis is a disease in which the bones lose minerals and strength with aging. This can result in serious bone fractures. Your risk for osteoporosis can be identified using a bone density scan.  If you are 46 years of age or older, or if you are at risk for osteoporosis and fractures, ask your health care provider if you should be screened.  Ask your health care provider whether you should take a calcium or vitamin D supplement to lower your risk for osteoporosis.  Menopause may have certain physical symptoms and risks.  Hormone replacement therapy may reduce some of these symptoms and risks. Talk to your health care provider about whether hormone replacement therapy is right for you.  HOME CARE INSTRUCTIONS   Schedule regular health, dental, and eye exams.  Stay current with your immunizations.   Do not use any tobacco products including cigarettes, chewing tobacco, or electronic cigarettes.  If you are pregnant, do not drink alcohol.  If you are breastfeeding, limit how much and how often you drink alcohol.  Limit alcohol intake to no more than 1 drink per day for nonpregnant women. One drink equals 12 ounces of beer, 5 ounces of wine, or 1 ounces of hard liquor.  Do not use street drugs.  Do not share needles.  Ask your health care provider for help if  you need support or information about quitting drugs.  Tell your health care provider if you often feel depressed.  Tell your health care provider if you have ever been abused or do not feel safe at home.   This information is not intended to replace advice given to you by your health care provider. Make sure you discuss any questions you have with your health care provider.   Document Released: 11/18/2010 Document Revised: 05/26/2014 Document Reviewed: 04/06/2013 Elsevier Interactive Patient Education [employer].  6 months chronic issues with Prolia injection

## 2015-02-21 NOTE — Progress Notes (Signed)
Pre visit review using our clinic review tool, if applicable. No additional management support is needed unless otherwise documented below in the visit note. 

## 2015-02-28 ENCOUNTER — Telehealth: Payer: Self-pay | Admitting: Family Medicine

## 2015-02-28 NOTE — Telephone Encounter (Signed)
Patient requesting Rx for congestion to be sent to [facility]

## 2015-02-28 NOTE — Telephone Encounter (Signed)
LMOM for pt to CB.  There is no Rx strength medication for congestion.  She can get that OTC.  If she feels she needs an antibiotic, she will need an appointment.

## 2015-02-28 NOTE — Telephone Encounter (Signed)
Patient aware.  She will see how it is over the next few days and if not better she will schedule appt.

## 2015-03-22 ENCOUNTER — Telehealth: Payer: Self-pay | Admitting: *Deleted

## 2015-03-22 DIAGNOSIS — M810 Age-related osteoporosis without current pathological fracture: Secondary | ICD-10-CM

## 2015-03-22 DIAGNOSIS — M62838 Other muscle spasm: Secondary | ICD-10-CM

## 2015-03-22 NOTE — Telephone Encounter (Signed)
Patient called states you had told her you would call in a muscle relaxer if she needed it. She is requesting you send Rx to her pharmacy. Please advise. Her l;ast office visit was 02/21/15.

## 2015-03-23 ENCOUNTER — Encounter: Payer: Self-pay | Admitting: *Deleted

## 2015-03-23 DIAGNOSIS — M62838 Other muscle spasm: Secondary | ICD-10-CM | POA: Insufficient documentation

## 2015-03-23 MED ORDER — BACLOFEN 10 MG PO TABS: 10.0000 mg | ORAL_TABLET | Freq: Two times a day (BID) | ORAL | Status: DC

## 2015-03-23 NOTE — Telephone Encounter (Signed)
Patient called and left message on voice mail stating she has no idea what muscle relaxer she took in the past . She stated it had been several years ago. She states she just wants something mild called in.Please advise

## 2015-03-23 NOTE — Telephone Encounter (Signed)
Patient requesting muscle relaxer to be prescribed. She is a new patient, seen once, and a muscle relaxer was not on her medication list at that time. I do recall her stating she takes one occasionally for her pain related to osteoporosis, but she never gave [place] the name of the relaxer she had used prior. I have not received her records yet, so I am uncertain what she was on prior. Please call pt and ask her the name of the medication she is requesting. Thanks

## 2015-03-23 NOTE — Telephone Encounter (Signed)
Baclofen prescribed. F/U if no improvement in symptoms.

## 2015-03-23 NOTE — Telephone Encounter (Signed)
Patient notified and given instructions.

## 2015-05-02 ENCOUNTER — Inpatient Hospital Stay (HOSPITAL_COMMUNITY): Payer: No Typology Code available for payment source

## 2015-05-02 ENCOUNTER — Encounter (HOSPITAL_COMMUNITY): Payer: Self-pay | Admitting: Cardiovascular Disease

## 2015-05-02 ENCOUNTER — Encounter (HOSPITAL_COMMUNITY)
Admission: EM | Disposition: A | Discharge status: Home / Self Care | Payer: Self-pay | Attending: Cardiovascular Disease

## 2015-05-02 ENCOUNTER — Inpatient Hospital Stay (HOSPITAL_COMMUNITY)
Admission: EM | Admit: 2015-05-02 | Discharge: 2015-05-04 | DRG: 281 | Disposition: A | Discharge status: Home / Self Care | Payer: No Typology Code available for payment source | Source: Intra-hospital | Attending: Cardiovascular Disease | Admitting: Cardiovascular Disease

## 2015-05-02 DIAGNOSIS — K219 Gastro-esophageal reflux disease without esophagitis: Secondary | ICD-10-CM | POA: Diagnosis present

## 2015-05-02 DIAGNOSIS — M810 Age-related osteoporosis without current pathological fracture: Secondary | ICD-10-CM | POA: Diagnosis present

## 2015-05-02 DIAGNOSIS — I10 Essential (primary) hypertension: Secondary | ICD-10-CM | POA: Diagnosis present

## 2015-05-02 DIAGNOSIS — Z888 Allergy status to other drugs, medicaments and biological substances status: Secondary | ICD-10-CM

## 2015-05-02 DIAGNOSIS — M542 Cervicalgia: Secondary | ICD-10-CM

## 2015-05-02 DIAGNOSIS — Z96642 Presence of left artificial hip joint: Secondary | ICD-10-CM | POA: Diagnosis present

## 2015-05-02 DIAGNOSIS — I2129 ST elevation (STEMI) myocardial infarction involving other sites: Principal | ICD-10-CM | POA: Diagnosis present

## 2015-05-02 DIAGNOSIS — Z7982 Long term (current) use of aspirin: Secondary | ICD-10-CM | POA: Diagnosis not present

## 2015-05-02 DIAGNOSIS — I428 Other cardiomyopathies: Secondary | ICD-10-CM

## 2015-05-02 DIAGNOSIS — I5181 Takotsubo syndrome: Secondary | ICD-10-CM | POA: Diagnosis not present

## 2015-05-02 DIAGNOSIS — I2510 Atherosclerotic heart disease of native coronary artery without angina pectoris: Secondary | ICD-10-CM | POA: Diagnosis present

## 2015-05-02 DIAGNOSIS — S199XXA Unspecified injury of neck, initial encounter: Secondary | ICD-10-CM | POA: Diagnosis not present

## 2015-05-02 DIAGNOSIS — R072 Precordial pain: Secondary | ICD-10-CM | POA: Insufficient documentation

## 2015-05-02 DIAGNOSIS — E785 Hyperlipidemia, unspecified: Secondary | ICD-10-CM | POA: Diagnosis present

## 2015-05-02 DIAGNOSIS — R9431 Abnormal electrocardiogram [ECG] [EKG]: Secondary | ICD-10-CM | POA: Insufficient documentation

## 2015-05-02 DIAGNOSIS — I213 ST elevation (STEMI) myocardial infarction of unspecified site: Secondary | ICD-10-CM

## 2015-05-02 HISTORY — DX: ST elevation (STEMI) myocardial infarction of unspecified site: I21.3

## 2015-05-02 HISTORY — PX: CARDIAC CATHETERIZATION: SHX172

## 2015-05-02 LAB — TSH: TSH: 2.892 u[IU]/mL (ref 0.350–4.500)

## 2015-05-02 LAB — COMPREHENSIVE METABOLIC PANEL
ALT: 17 U/L (ref 14–54)
AST: 25 U/L (ref 15–41)
Albumin: 3.6 g/dL (ref 3.5–5.0)
Alkaline Phosphatase: 61 U/L (ref 38–126)
Anion gap: 10 (ref 5–15)
BUN: 19 mg/dL (ref 6–20)
CALCIUM: 9.2 mg/dL (ref 8.9–10.3)
CHLORIDE: 106 mmol/L (ref 101–111)
CO2: 23 mmol/L (ref 22–32)
CREATININE: 0.86 mg/dL (ref 0.44–1.00)
Glucose, Bld: 107 mg/dL — ABNORMAL HIGH (ref 65–99)
Potassium: 4 mmol/L (ref 3.5–5.1)
Sodium: 139 mmol/L (ref 135–145)
TOTAL PROTEIN: 6.9 g/dL (ref 6.5–8.1)
Total Bilirubin: 0.6 mg/dL (ref 0.3–1.2)

## 2015-05-02 LAB — CBC WITH DIFFERENTIAL/PLATELET
BASOS PCT: 0 %
Basophils Absolute: 0 10*3/uL (ref 0.0–0.1)
EOS ABS: 0.2 10*3/uL (ref 0.0–0.7)
EOS PCT: 2 %
HCT: 37.4 % (ref 36.0–46.0)
Hemoglobin: 12.2 g/dL (ref 12.0–15.0)
Lymphocytes Relative: 16 %
Lymphs Abs: 1.7 10*3/uL (ref 0.7–4.0)
MCH: 31 pg (ref 26.0–34.0)
MCHC: 32.6 g/dL (ref 30.0–36.0)
MCV: 95.2 fL (ref 78.0–100.0)
MONO ABS: 0.7 10*3/uL (ref 0.1–1.0)
MONOS PCT: 7 %
NEUTROS PCT: 75 %
Neutro Abs: 7.9 10*3/uL — ABNORMAL HIGH (ref 1.7–7.7)
PLATELETS: 302 10*3/uL (ref 150–400)
RBC: 3.93 MIL/uL (ref 3.87–5.11)
RDW: 12.4 % (ref 11.5–15.5)
WBC: 10.6 10*3/uL — ABNORMAL HIGH (ref 4.0–10.5)

## 2015-05-02 LAB — TROPONIN I
TROPONIN I: 4.5 ng/mL — AB (ref <0.031)
Troponin I: 4.95 ng/mL (ref <0.031)

## 2015-05-02 LAB — PROTIME-INR
INR: 1.38 (ref 0.00–1.49)
PROTHROMBIN TIME: 17.1 s — AB (ref 11.6–15.2)

## 2015-05-02 LAB — POCT I-STAT, CHEM 8
BUN: 20 mg/dL (ref 6–20)
CREATININE: 0.8 mg/dL (ref 0.44–1.00)
Calcium, Ion: 1.2 mmol/L (ref 1.13–1.30)
Chloride: 105 mmol/L (ref 101–111)
GLUCOSE: 142 mg/dL — AB (ref 65–99)
HCT: 37 % (ref 36.0–46.0)
HEMOGLOBIN: 12.6 g/dL (ref 12.0–15.0)
Potassium: 3.8 mmol/L (ref 3.5–5.1)
Sodium: 141 mmol/L (ref 135–145)
TCO2: 24 mmol/L (ref 0–100)

## 2015-05-02 LAB — MRSA PCR SCREENING: MRSA by PCR: NEGATIVE

## 2015-05-02 SURGERY — LEFT HEART CATH AND CORONARY ANGIOGRAPHY

## 2015-05-02 MED ORDER — BIVALIRUDIN BOLUS VIA INFUSION - CUPID
INTRAVENOUS | Status: DC | PRN
Start: 2015-05-02 — End: 2015-05-02
  Administered 2015-05-02: 49.5 mg via INTRAVENOUS

## 2015-05-02 MED ORDER — IOHEXOL 350 MG/ML SOLN
INTRAVENOUS | Status: DC | PRN
Start: 2015-05-02 — End: 2015-05-02
  Administered 2015-05-02: 85 mL via INTRA_ARTERIAL

## 2015-05-02 MED ORDER — CALCIUM CARBONATE-VITAMIN D 500-200 MG-UNIT PO TABS
1.0000 | ORAL_TABLET | Freq: Every day | ORAL | Status: DC
  Administered 2015-05-02 – 2015-05-04 (×3): 1 via ORAL
  Filled 2015-05-02 (×3): qty 1

## 2015-05-02 MED ORDER — PANTOPRAZOLE SODIUM 40 MG PO TBEC
40.0000 mg | DELAYED_RELEASE_TABLET | Freq: Every day | ORAL | Status: DC
  Administered 2015-05-02 – 2015-05-04 (×3): 40 mg via ORAL
  Filled 2015-05-02 (×3): qty 1

## 2015-05-02 MED ORDER — PERFLUTREN LIPID MICROSPHERE
INTRAVENOUS | Status: AC
  Administered 2015-05-02: 2 mL
  Filled 2015-05-02: qty 10

## 2015-05-02 MED ORDER — HEPARIN SODIUM (PORCINE) 1000 UNIT/ML IJ SOLN
INTRAMUSCULAR | Status: AC
  Filled 2015-05-02: qty 1

## 2015-05-02 MED ORDER — SODIUM CHLORIDE 0.9 % IV SOLN: 250.0000 mL | INTRAVENOUS | Status: DC | PRN

## 2015-05-02 MED ORDER — NITROGLYCERIN 1 MG/10 ML FOR IR/CATH LAB
INTRA_ARTERIAL | Status: AC
  Filled 2015-05-02: qty 10

## 2015-05-02 MED ORDER — VERAPAMIL HCL 2.5 MG/ML IV SOLN
INTRAVENOUS | Status: AC
  Filled 2015-05-02: qty 2

## 2015-05-02 MED ORDER — OXYCODONE-ACETAMINOPHEN 5-325 MG PO TABS
1.0000 | ORAL_TABLET | ORAL | Status: DC | PRN
  Administered 2015-05-02: 2 via ORAL
  Filled 2015-05-02: qty 2

## 2015-05-02 MED ORDER — SODIUM CHLORIDE 0.9 % IV SOLN: INTRAVENOUS | Status: AC

## 2015-05-02 MED ORDER — LIDOCAINE HCL (PF) 1 % IJ SOLN
INTRAMUSCULAR | Status: AC
  Filled 2015-05-02: qty 30

## 2015-05-02 MED ORDER — FENTANYL CITRATE (PF) 100 MCG/2ML IJ SOLN
INTRAMUSCULAR | Status: AC
  Filled 2015-05-02: qty 2

## 2015-05-02 MED ORDER — FENTANYL CITRATE (PF) 100 MCG/2ML IJ SOLN
INTRAMUSCULAR | Status: DC | PRN
  Administered 2015-05-02: 25 ug via INTRAVENOUS

## 2015-05-02 MED ORDER — ONDANSETRON HCL 4 MG/2ML IJ SOLN
4.0000 mg | Freq: Four times a day (QID) | INTRAMUSCULAR | Status: DC | PRN
  Administered 2015-05-02: 4 mg via INTRAVENOUS
  Filled 2015-05-02: qty 2

## 2015-05-02 MED ORDER — ASPIRIN 81 MG PO TABS: 81.0000 mg | ORAL_TABLET | Freq: Every day | ORAL | Status: DC

## 2015-05-02 MED ORDER — ASPIRIN EC 81 MG PO TBEC: 81.0000 mg | DELAYED_RELEASE_TABLET | Freq: Every day | ORAL | Status: DC

## 2015-05-02 MED ORDER — LOSARTAN POTASSIUM 50 MG PO TABS
100.0000 mg | ORAL_TABLET | Freq: Every day | ORAL | Status: DC
  Administered 2015-05-02 – 2015-05-04 (×3): 100 mg via ORAL
  Filled 2015-05-02 (×3): qty 2

## 2015-05-02 MED ORDER — VERAPAMIL HCL 2.5 MG/ML IV SOLN
INTRAVENOUS | Status: DC | PRN
  Administered 2015-05-02: 8 mL via INTRA_ARTERIAL

## 2015-05-02 MED ORDER — CARVEDILOL 3.125 MG PO TABS
3.1250 mg | ORAL_TABLET | Freq: Two times a day (BID) | ORAL | Status: DC
  Administered 2015-05-02 – 2015-05-04 (×4): 3.125 mg via ORAL
  Filled 2015-05-02 (×4): qty 1

## 2015-05-02 MED ORDER — NITROGLYCERIN 1 MG/10 ML FOR IR/CATH LAB
INTRA_ARTERIAL | Status: DC | PRN
  Administered 2015-05-02: 5 mL

## 2015-05-02 MED ORDER — SODIUM CHLORIDE 0.9 % IJ SOLN
3.0000 mL | Freq: Two times a day (BID) | INTRAMUSCULAR | Status: DC
  Administered 2015-05-02 – 2015-05-04 (×4): 3 mL via INTRAVENOUS

## 2015-05-02 MED ORDER — MIDAZOLAM HCL 2 MG/2ML IJ SOLN
INTRAMUSCULAR | Status: AC
  Filled 2015-05-02: qty 2

## 2015-05-02 MED ORDER — SODIUM CHLORIDE 0.9 % IJ SOLN: 3.0000 mL | INTRAMUSCULAR | Status: DC | PRN

## 2015-05-02 MED ORDER — HEPARIN (PORCINE) IN NACL 2-0.9 UNIT/ML-% IJ SOLN
INTRAMUSCULAR | Status: AC
  Filled 2015-05-02: qty 1500

## 2015-05-02 MED ORDER — ADULT MULTIVITAMIN W/MINERALS CH
1.0000 | ORAL_TABLET | Freq: Every day | ORAL | Status: DC
  Administered 2015-05-02 – 2015-05-04 (×3): 1 via ORAL
  Filled 2015-05-02 (×3): qty 1

## 2015-05-02 MED ORDER — SODIUM CHLORIDE 0.9 % IV SOLN
250.0000 mg | INTRAVENOUS | Status: DC | PRN
  Administered 2015-05-02: 1.75 mg/kg/h via INTRAVENOUS

## 2015-05-02 MED ORDER — MORPHINE SULFATE (PF) 2 MG/ML IV SOLN: 1.0000 mg | INTRAVENOUS | Status: DC | PRN

## 2015-05-02 MED ORDER — BIVALIRUDIN 250 MG IV SOLR
INTRAVENOUS | Status: AC
  Filled 2015-05-02: qty 250

## 2015-05-02 MED ORDER — EZETIMIBE 10 MG PO TABS
10.0000 mg | ORAL_TABLET | Freq: Every day | ORAL | Status: DC
  Administered 2015-05-02 – 2015-05-04 (×3): 10 mg via ORAL
  Filled 2015-05-02 (×3): qty 1

## 2015-05-02 MED ORDER — ACETAMINOPHEN 325 MG PO TABS: 650.0000 mg | ORAL_TABLET | ORAL | Status: DC | PRN

## 2015-05-02 MED ORDER — MIDAZOLAM HCL 2 MG/2ML IJ SOLN
INTRAMUSCULAR | Status: DC | PRN
  Administered 2015-05-02: 1 mg via INTRAVENOUS

## 2015-05-02 MED ORDER — ASPIRIN EC 81 MG PO TBEC
81.0000 mg | DELAYED_RELEASE_TABLET | Freq: Every day | ORAL | Status: DC
  Administered 2015-05-03 – 2015-05-04 (×2): 81 mg via ORAL
  Filled 2015-05-02 (×2): qty 1

## 2015-05-02 SURGICAL SUPPLY — 14 items
CATH INFINITI 5FR ANG PIGTAIL (CATHETERS) ×3 IMPLANT
CATH INFINITI JR4 5F (CATHETERS) ×3 IMPLANT
CATH VISTA GUIDE 6FR XBLAD3.5 (CATHETERS) ×2 IMPLANT
DEVICE RAD COMP TR BAND LRG (VASCULAR PRODUCTS) ×3 IMPLANT
ELECT DEFIB PAD ADLT CADENCE (PAD) ×2 IMPLANT
GLIDESHEATH SLEND SS 6F .021 (SHEATH) ×3 IMPLANT
KIT ENCORE 26 ADVANTAGE (KITS) ×2 IMPLANT
KIT HEART LEFT (KITS) ×3 IMPLANT
PACK CARDIAC CATHETERIZATION (CUSTOM PROCEDURE TRAY) ×3 IMPLANT
SYR MEDRAD MARK V 150ML (SYRINGE) ×3 IMPLANT
TRANSDUCER W/STOPCOCK (MISCELLANEOUS) ×3 IMPLANT
TUBING CIL FLEX 10 FLL-RA (TUBING) ×3 IMPLANT
WIRE HI TORQ VERSACORE-J 145CM (WIRE) ×2 IMPLANT
WIRE SAFE-T 1.5MM-J .035X260CM (WIRE) ×3 IMPLANT

## 2015-05-02 NOTE — Progress Notes (Signed)
CT scan negative for acute fracture.  She has no pain with movement of her neck.  Her C-spine is cleared.  Angela Preston. Dahlia Bailiff, MD, [place] [phone] Trauma Surgeon

## 2015-05-02 NOTE — Care Management Note (Signed)
Case Management Note  Patient Details  Name: Angela Preston MRN: [id number] Date of Birth: 08/30/1940  Subjective/Objective:     Adm w mi               Action/Plan: lives w [facility], pcp dr Raoul Pitch   Expected Discharge Date:                  Expected Discharge Plan:     In-House Referral:     Discharge planning Services     Post Acute Care Choice:    Choice offered to:     DME Arranged:    DME Agency:     HH Arranged:    [place] Agency:     Status of Service:     Medicare Important Message Given:    Date Medicare IM Given:    Medicare IM give by:    Date Additional Medicare IM Given:    Additional Medicare Important Message give by:     If discussed at [place] of Stay Meetings, dates discussed:    Additional Comments: ur review done  Lacretia Leigh, RN 05/02/2015, 12:26 PM
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
## 2015-05-02 NOTE — Progress Notes (Signed)
Echocardiogram 2D Echocardiogram with Definity has been performed.  Tresa Res 05/02/2015, 3:10 PM
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
## 2015-05-03 DIAGNOSIS — R0989 Other specified symptoms and signs involving the circulatory and respiratory systems: Secondary | ICD-10-CM | POA: Diagnosis not present

## 2015-05-03 DIAGNOSIS — E785 Hyperlipidemia, unspecified: Secondary | ICD-10-CM

## 2015-05-03 DIAGNOSIS — I5181 Takotsubo syndrome: Secondary | ICD-10-CM | POA: Diagnosis not present

## 2015-05-03 DIAGNOSIS — I10 Essential (primary) hypertension: Secondary | ICD-10-CM | POA: Diagnosis not present

## 2015-05-03 LAB — LIPID PANEL
Cholesterol: 191 mg/dL (ref 0–200)
HDL: 40 mg/dL — ABNORMAL LOW (ref >40)
LDL CALC: 106 mg/dL — AB (ref 0–99)
Total CHOL/HDL Ratio: 4.8 RATIO
Triglycerides: 224 mg/dL — ABNORMAL HIGH (ref <150)
VLDL: 45 mg/dL — AB (ref 0–40)

## 2015-05-03 LAB — BASIC METABOLIC PANEL
ANION GAP: 8 (ref 5–15)
BUN: 22 mg/dL — AB (ref 6–20)
CALCIUM: 9.3 mg/dL (ref 8.9–10.3)
CO2: 28 mmol/L (ref 22–32)
Chloride: 103 mmol/L (ref 101–111)
Creatinine, Ser: 1.1 mg/dL — ABNORMAL HIGH (ref 0.44–1.00)
GFR calc Af Amer: 55 mL/min — ABNORMAL LOW (ref >60)
GFR calc non Af Amer: 48 mL/min — ABNORMAL LOW (ref >60)
GLUCOSE: 106 mg/dL — AB (ref 65–99)
POTASSIUM: 3.7 mmol/L (ref 3.5–5.1)
Sodium: 139 mmol/L (ref 135–145)

## 2015-05-03 LAB — CBC
HEMATOCRIT: 35.4 % — AB (ref 36.0–46.0)
HEMOGLOBIN: 11.4 g/dL — AB (ref 12.0–15.0)
MCH: 30.7 pg (ref 26.0–34.0)
MCHC: 32.2 g/dL (ref 30.0–36.0)
MCV: 95.4 fL (ref 78.0–100.0)
Platelets: 339 10*3/uL (ref 150–400)
RBC: 3.71 MIL/uL — ABNORMAL LOW (ref 3.87–5.11)
RDW: 12.7 % (ref 11.5–15.5)
WBC: 9.2 10*3/uL (ref 4.0–10.5)

## 2015-05-03 LAB — TROPONIN I: Troponin I: 4.74 ng/mL (ref <0.031)

## 2015-05-03 MED ORDER — OMEGA-3-ACID ETHYL ESTERS 1 G PO CAPS
1.0000 g | ORAL_CAPSULE | Freq: Two times a day (BID) | ORAL | Status: DC
  Administered 2015-05-03 – 2015-05-04 (×3): 1 g via ORAL
  Filled 2015-05-03 (×3): qty 1

## 2015-05-03 NOTE — Progress Notes (Signed)
Attempted to call report. Nurse not available to take report at this time. Will call back shortly. Personal phone number left with secretary for return call.
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
## 2015-05-04 DIAGNOSIS — R0989 Other specified symptoms and signs involving the circulatory and respiratory systems: Secondary | ICD-10-CM | POA: Diagnosis not present

## 2015-05-04 DIAGNOSIS — R072 Precordial pain: Secondary | ICD-10-CM | POA: Diagnosis not present

## 2015-05-04 DIAGNOSIS — R7989 Other specified abnormal findings of blood chemistry: Secondary | ICD-10-CM

## 2015-05-04 MED ORDER — CARVEDILOL 3.125 MG PO TABS: 3.1250 mg | ORAL_TABLET | Freq: Two times a day (BID) | ORAL | Status: DC

## 2015-05-04 MED ORDER — OMEGA-3-ACID ETHYL ESTERS 1 G PO CAPS: 1.0000 g | ORAL_CAPSULE | Freq: Two times a day (BID) | ORAL | Status: DC

## 2015-05-04 NOTE — Discharge Instructions (Signed)
cath Site Care Refer to this sheet in the next few weeks. These instructions provide you with information about caring for yourself after your procedure. Your health care provider may also give you more specific instructions. Your treatment has been planned according to current medical practices, but problems sometimes occur. Call your health care provider if you have any problems or questions after your procedure. WHAT TO EXPECT AFTER THE PROCEDURE After your procedure, it is typical to have the following:  Bruising at the radial site that usually fades within 1-2 weeks.  Blood collecting in the tissue (hematoma) that may be painful to the touch. It should usually decrease in size and tenderness within 1-2 weeks. HOME CARE INSTRUCTIONS  Take medicines only as directed by your health care provider.  You may shower 24-48 hours after the procedure or as directed by your health care provider. Remove the bandage (dressing) and gently wash the site with plain soap and water. Pat the area dry with a clean towel. Do not rub the site, because this may cause bleeding.  Do not take baths, swim, or use a hot tub until your health care provider approves.  Check your insertion site every day for redness, swelling, or drainage.  Do not apply powder or lotion to the site.  Do not flex or bend the affected arm for 24 hours or as directed by your health care provider.  Do not push or pull heavy objects with the affected arm for 24 hours or as directed by your health care provider.  Do not lift over 10 lb (4.5 kg) for 5 days after your procedure or as directed by your health care provider.  Ask your health care provider when it is okay to:  Return to work or school.  Resume usual physical activities or sports.  Resume sexual activity.  Do not drive home if you are discharged the same day as the procedure. Have someone else drive you.  You may drive 24 hours after the procedure unless otherwise  instructed by your health care provider.  Do not operate machinery or power tools for 24 hours after the procedure.  If your procedure was done as an outpatient procedure, which means that you went home the same day as your procedure, a responsible adult should be with you for the first 24 hours after you arrive home.  Keep all follow-up visits as directed by your health care provider. This is important. SEEK MEDICAL CARE IF:  You have a fever.  You have chills.  You have increased bleeding from the radial site. Hold pressure on the site. SEEK IMMEDIATE MEDICAL CARE IF:  You have unusual pain at the radial site.  You have redness, warmth, or swelling at the radial site.  You have drainage (other than a small amount of blood on the dressing) from the radial site.  The radial site is bleeding, and the bleeding does not stop after 30 minutes of holding steady pressure on the site.  Your arm or hand becomes pale, cool, tingly, or numb.   This information is not intended to replace advice given to you by your health care provider. Make sure you discuss any questions you have with your health care provider.   Document Released: 06/07/2010 Document Revised: 05/26/2014 Document Reviewed: 11/21/2013 Elsevier Interactive Patient Education [employer].
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
## 2015-05-04 NOTE — Plan of Care (Signed)
Problem: Consults Goal: Cardiac Cath Patient Education (See Patient Education module for education specifics.)  Outcome: Completed/Met Date Met:  05/04/15 Patient is status-post cardiac catheterization with stable vital signs and no active complaints overnight.  Recent vital signs: Filed Vitals:    05/03/15 1845 05/03/15 1851 05/03/15 2046 05/04/15 0544  BP:   112/62 104/63 93/56  Pulse:   78 67 64  Temp:   97.9 F (36.6 C) 97.3 F (36.3 C) 97.9 F (36.6 C)  TempSrc:   Oral Oral Oral  Resp:     20 18  Height: 5' 1" (1.549 m)        Weight: 68.539 kg (151 lb 1.6 oz)     68.675 kg (151 lb 6.4 oz)  SpO2:   98% 97% 97%   Patient and family educated at bedside last evening re:  Plan of care  Post-cath site care  Common signs/symptoms of site problems/who to call  Medications this shift  lovaza 1 gm PO  Progressive ambulation  Safety plan  Plan for discharge in AM 12/16  Patient due to ambulate in AM. Still needs discharge education, otherwise, education per pathway complete.  Continuing to monitor.

## 2015-05-04 NOTE — Progress Notes (Signed)
Pt in stable condition and discharged to private vehicle. No further questions and discharge instructions reviewed

## 2015-05-07 ENCOUNTER — Telehealth: Payer: Self-pay | Admitting: Cardiovascular Disease

## 2015-05-07 NOTE — Telephone Encounter (Signed)
D/ C phone call .Marland Kitchen Appt is on 05/15/15 at 2pm w/Luke Kilroy at the [facility] office   Thanks

## 2015-05-08 NOTE — Telephone Encounter (Signed)
[facility] 05/08/15 re: TCM call

## 2015-05-09 NOTE — Telephone Encounter (Signed)
Patient contacted regarding discharge from [facility] on 05/04/2015.  Patient understands to follow up with provider Kerin Ransom on 05/15/2015 at 2:00pm at [facility] Patient understands discharge instructions? yes  Patient understands medications and regiment? yes  Patient understands to bring all medications to this visit? yes

## 2015-05-15 ENCOUNTER — Encounter: Payer: Self-pay | Admitting: Cardiology

## 2015-05-15 ENCOUNTER — Ambulatory Visit (INDEPENDENT_AMBULATORY_CARE_PROVIDER_SITE_OTHER): Payer: Medicare Other | Admitting: Cardiology

## 2015-05-15 VITALS — BP 124/72 | HR 60 | Ht 61.0 in | Wt 146.2 lb

## 2015-05-15 DIAGNOSIS — I10 Essential (primary) hypertension: Secondary | ICD-10-CM

## 2015-05-15 DIAGNOSIS — E785 Hyperlipidemia, unspecified: Secondary | ICD-10-CM | POA: Diagnosis not present

## 2015-05-15 DIAGNOSIS — Z0389 Encounter for observation for other suspected diseases and conditions ruled out: Secondary | ICD-10-CM

## 2015-05-15 DIAGNOSIS — I5181 Takotsubo syndrome: Secondary | ICD-10-CM

## 2015-05-15 DIAGNOSIS — IMO0001 Reserved for inherently not codable concepts without codable children: Secondary | ICD-10-CM

## 2015-05-15 NOTE — Assessment & Plan Note (Signed)
Satin intolerant -LDL 104, HDL 40 Dec 2016 on Zetia
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
## 2015-05-15 NOTE — Assessment & Plan Note (Signed)
Controlled.  

## 2015-05-15 NOTE — Assessment & Plan Note (Signed)
She has had 3 caths- all showing no significant CAD. Last cath 05/02/15 in setting of Takotsubo STEMI

## 2015-05-15 NOTE — Addendum Note (Signed)
Addended by: Erlene Quan on: 05/15/2015 02:50 PM   Modules accepted: Level of Service

## 2015-05-15 NOTE — Patient Instructions (Signed)
Kerin Ransom, PA-C has made no changes today in your current medications or treatment plan.  Your physician recommends that you schedule a follow-up appointment in 3 months with Dr. Claiborne Billings. You will receive a reminder letter in the mail two months in advance. If you don't receive a letter, please call our office to schedule the follow-up appointment.  If you need a refill on your cardiac medications before your next appointment, please call your pharmacy.
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
## 2015-07-01 ENCOUNTER — Other Ambulatory Visit: Payer: Self-pay | Admitting: Cardiovascular Disease

## 2015-07-02 NOTE — Telephone Encounter (Signed)
Rx request sent to pharmacy.  

## 2015-07-16 ENCOUNTER — Telehealth: Payer: Self-pay | Admitting: Family Medicine

## 2015-07-16 NOTE — Telephone Encounter (Signed)
Spoke with patient scheduled appt for evaluation . 

## 2015-07-16 NOTE — Telephone Encounter (Signed)
Pt is asking for a referral to a foot Dr. Abbott Pao states that she is having foot pain for hammer toe/curling toes.

## 2015-07-17 ENCOUNTER — Ambulatory Visit (INDEPENDENT_AMBULATORY_CARE_PROVIDER_SITE_OTHER): Payer: Medicare Other | Admitting: Family Medicine

## 2015-07-17 ENCOUNTER — Encounter: Payer: Self-pay | Admitting: Family Medicine

## 2015-07-17 VITALS — BP 129/84 | HR 92 | Temp 98.4°F | Resp 20 | Wt 147.2 lb

## 2015-07-17 DIAGNOSIS — M2041 Other hammer toe(s) (acquired), right foot: Secondary | ICD-10-CM | POA: Diagnosis not present

## 2015-07-17 DIAGNOSIS — M2021 Hallux rigidus, right foot: Secondary | ICD-10-CM | POA: Diagnosis not present

## 2015-07-17 DIAGNOSIS — M2020 Hallux rigidus, unspecified foot: Secondary | ICD-10-CM | POA: Insufficient documentation

## 2015-07-17 HISTORY — DX: Other hammer toe(s) (acquired), right foot: M20.41
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
## 2015-07-17 NOTE — Patient Instructions (Signed)
Hammer Toes Hammer toes is a condition in which one or more of your toes is permanently flexed. CAUSES  This happens when a muscle imbalance or abnormal bone length makes your small toes buckle. This causes the toe joint to contract and the strong cord-like bands that attach muscles to the bones (tendons) in your toes to shorten.  SIGNS AND SYMPTOMS  Common symptoms of flexible hammer toes include:   A buildup of skin cells (corns). Corns occur where boney bumps come in frequent contact with hard surfaces. For example, where your shoes press and rub.  Irritation.  Inflammation.  Pain.  Limited motion in your toes. DIAGNOSIS  Hammer toes are diagnosed through a physical exam of your toes. During the exam, your health care provider may try to reproduce your symptoms by manipulating your foot. Often, X-ray exams are done to determine the degree of deformity and to make sure that the cause is not a fracture.  TREATMENT  Hammer toes can be treated with corrective surgery. There are several types of surgical procedures that can treat hammer toes. The most common procedures include:  Arthroplasty--A portion of the joint is surgically removed and your toe is straightened. The gap fills in with fibrous tissue. This procedure helps treat pain and deformity and helps restore function.  Fusion--Cartilage between the two bones of the affected joint is taken out and the bones fuse together into one longer bone. This helps keep your toe stable and reduces pain but leaves your toe stiff, yet straight.  Implantation--A portion of your bone is removed and replaced with an implant to restore motion.  Flexor tendon transfers--This procedure repositions the tendons that curl the toes down (flexor tendons). This may be done to release the deforming force that causes your toe to buckle. Several of these procedures require fixing your toe with a pin that is visible at the tip of your toe. The pin keeps the toe  straight during healing. Your health care provider will remove the pin usually within 4-8 weeks after the procedure.    This information is not intended to replace advice given to you by your health care provider. Make sure you discuss any questions you have with your health care provider.   Document Released: 05/02/2000 Document Revised: 05/10/2013 Document Reviewed: 01/10/2013 Elsevier Interactive Patient Education 2016 Elsevier Inc.  Hallux Rigidus Hallux rigidus is a condition involving pain and a loss of motion of the first (big) toe. The pain gets worse with lifting up (extension) of the toe. This is usually due to arthritic bony bumps (spurring) of the joint at the base of the big toe.  SYMPTOMS   Pain, with lifting up of the toe.  Tenderness over the joint where the big toe meets the foot.  Redness, swelling, and warmth over the top of the base of the big toe (sometimes).  Foot pain, stiffness, and limping. CAUSES  Hallux rigidus is caused by arthritis of the joint where the big toe meets the foot. The arthritis creates a bone spur that pinches the soft tissues when the toe is extended. RISK INCREASES WITH:  Tight shoes with a narrow toe box.  Family history of foot problems.  Gout and rheumatoid and psoriatic arthritis.  History of previous toe injury, including "turf toe."  Long first toe, flat feet, and other big toe bony bumps.  Arthritis of the big toe. PREVENTION   Wear wide-toed shoes that fit well.  Tape the big toe to reduce motion and to  prevent pinching of the tissues between the bone.  Maintain physical fitness:  Foot and ankle flexibility.  Muscle strength and endurance. PROGNOSIS  This condition can usually be managed with proper treatment. However, surgery is typically required to prevent the problem from recurring.  RELATED COMPLICATIONS  Injury to other areas of the foot or ankle, caused by abnormal walking in an attempt to avoid the pain felt  when walking normally. TREATMENT Treatment first involves stopping the activities that aggravate your symptoms. Ice and medicine can be used to reduce the pain and inflammation. Modifications to shoes may help reduce pain, including wearing stiff-soled shoes, shoes with a wide toe box, inserting a padded donut to relieve pressure on top of the joint, or wearing an arch support. Corticosteroid injections may be given to reduce inflammation. If nonsurgical treatment is unsuccessful, surgery may be needed. Surgical options include removing the arthritic bony spur, cutting a bone in the foot to change the arc of motion (allowing the toe to extend more), or fusion of the joint (eliminating all motion in the joint at the base of the big toe).  MEDICATION   If pain medicine is needed, nonsteroidal anti-inflammatory medicines (aspirin and ibuprofen), or other minor pain relievers (acetaminophen), are often advised.  Do not take pain medicine for 7 days before surgery.  Prescription pain relievers are usually prescribed only after surgery. Use only as directed and only as much as you need.  Ointments for arthritis, applied to the skin, may give some relief.  Injections of corticosteroids may be given to reduce inflammation. HEAT AND COLD  Cold treatment (icing) relieves pain and reduces inflammation. Cold treatment should be applied for 10 to 15 minutes every 2 to 3 hours, and immediately after activity that aggravates your symptoms. Use ice packs or an ice massage.  Heat treatment may be used before performing the stretching and strengthening activities prescribed by your caregiver, physical therapist, or athletic trainer. Use a heat pack or a warm water soak. SEEK MEDICAL CARE IF:   Symptoms get worse or do not improve in 2 weeks, despite treatment.  After surgery you develop fever, increasing pain, redness, swelling, drainage of fluids, bleeding, or increasing warmth.  New, unexplained symptoms  develop. (Drugs used in treatment may produce side effects.)   This information is not intended to replace advice given to you by your health care provider. Make sure you discuss any questions you have with your health care provider.   Document Released: 05/05/2005 Document Revised: 05/26/2014 Document Reviewed: 08/17/2008 Elsevier Interactive Patient Education [employer].

## 2015-07-20 ENCOUNTER — Telehealth: Payer: Self-pay | Admitting: *Deleted

## 2015-07-20 NOTE — Telephone Encounter (Signed)
Let patient know referral in place for Ortho she will be getting a call to set up appt. Patient verbalized understanding.

## 2015-08-07 ENCOUNTER — Encounter: Payer: Self-pay | Admitting: Cardiovascular Disease

## 2015-08-07 ENCOUNTER — Ambulatory Visit (INDEPENDENT_AMBULATORY_CARE_PROVIDER_SITE_OTHER): Payer: Medicare Other | Admitting: Cardiovascular Disease

## 2015-08-07 VITALS — BP 140/86 | HR 62 | Ht 61.0 in | Wt 145.3 lb

## 2015-08-07 DIAGNOSIS — E785 Hyperlipidemia, unspecified: Secondary | ICD-10-CM | POA: Diagnosis not present

## 2015-08-07 DIAGNOSIS — I10 Essential (primary) hypertension: Secondary | ICD-10-CM

## 2015-08-07 DIAGNOSIS — I5181 Takotsubo syndrome: Secondary | ICD-10-CM

## 2015-08-07 NOTE — Patient Instructions (Addendum)
Your physician has requested that you have an echocardiogram. Echocardiography is a painless test that uses sound waves to create images of your heart. It provides your doctor with information about the size and shape of your heart and how well your heart's chambers and valves are working. This procedure takes approximately one hour. There are no restrictions for this procedure.  Your physician wants you to follow-up in: 6 months or sooner if needed. You will receive a reminder letter in the mail two months in advance. If you don't receive a letter, please call our office to schedule the follow-up appointment.  If you need a refill on your cardiac medications before your next appointment, please call your pharmacy.    
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
## 2015-08-12 ENCOUNTER — Other Ambulatory Visit: Payer: Self-pay | Admitting: Cardiovascular Disease

## 2015-08-13 NOTE — Telephone Encounter (Signed)
Rx request sent to pharmacy.  

## 2015-08-15 ENCOUNTER — Telehealth: Payer: Self-pay | Admitting: Cardiovascular Disease

## 2015-08-15 NOTE — Telephone Encounter (Addendum)
Spoke to patient  RN reviewed patient chart   Dec 2016 - patient had echo that showed an EF 50-55%, the same day she had a cath.   Patient saw Dr Claiborne Billings on 08/17/15 - order another echo( schedule for 08/2015) to be done. patient aware will need to defer to Dr Claiborne Billings to see if echo is needed.

## 2015-08-15 NOTE — Telephone Encounter (Signed)
Pt calling in stating that she had a complete Echo in the hospital on 05/02/15 and she wanted to know if she needed to have one done again. Please f/u with pt  Thanks

## 2015-08-18 NOTE — Telephone Encounter (Signed)
Cath showed severe takotsubo cardiomyopathy with ef 25%; doubt initial echo ef with wall motion abnl, also had sig pulm, htn;  Repeat echo as scheduled

## 2015-08-20 NOTE — Telephone Encounter (Signed)
Patient notified to keep appointment for echo Patient verbalized understanding.

## 2015-08-22 ENCOUNTER — Encounter: Payer: Self-pay | Admitting: Family Medicine

## 2015-08-22 ENCOUNTER — Ambulatory Visit (INDEPENDENT_AMBULATORY_CARE_PROVIDER_SITE_OTHER): Payer: Medicare Other | Admitting: Family Medicine

## 2015-08-22 ENCOUNTER — Ambulatory Visit: Payer: Medicare Other | Admitting: Family Medicine

## 2015-08-22 ENCOUNTER — Telehealth: Payer: Self-pay | Admitting: Family Medicine

## 2015-08-22 VITALS — BP 139/80 | HR 52 | Temp 97.7°F | Resp 20 | Ht 61.0 in | Wt 141.0 lb

## 2015-08-22 DIAGNOSIS — Z0000 Encounter for general adult medical examination without abnormal findings: Secondary | ICD-10-CM | POA: Insufficient documentation

## 2015-08-22 DIAGNOSIS — Z1231 Encounter for screening mammogram for malignant neoplasm of breast: Secondary | ICD-10-CM

## 2015-08-22 DIAGNOSIS — Z86010 Personal history of colonic polyps: Secondary | ICD-10-CM

## 2015-08-22 DIAGNOSIS — I10 Essential (primary) hypertension: Secondary | ICD-10-CM

## 2015-08-22 DIAGNOSIS — Z23 Encounter for immunization: Secondary | ICD-10-CM

## 2015-08-22 DIAGNOSIS — M810 Age-related osteoporosis without current pathological fracture: Secondary | ICD-10-CM

## 2015-08-22 DIAGNOSIS — I5181 Takotsubo syndrome: Secondary | ICD-10-CM

## 2015-08-22 DIAGNOSIS — Z1239 Encounter for other screening for malignant neoplasm of breast: Secondary | ICD-10-CM | POA: Insufficient documentation

## 2015-08-22 LAB — COMPREHENSIVE METABOLIC PANEL
ALT: 13 U/L (ref 0–35)
AST: 16 U/L (ref 0–37)
Albumin: 4.4 g/dL (ref 3.5–5.2)
Alkaline Phosphatase: 55 U/L (ref 39–117)
BUN: 21 mg/dL (ref 6–23)
CO2: 32 mEq/L (ref 19–32)
Calcium: 10 mg/dL (ref 8.4–10.5)
Chloride: 99 mEq/L (ref 96–112)
Creatinine, Ser: 0.91 mg/dL (ref 0.40–1.20)
GFR: 63.86 mL/min (ref >60.00)
Glucose, Bld: 104 mg/dL — ABNORMAL HIGH (ref 70–99)
Potassium: 4.3 mEq/L (ref 3.5–5.1)
Sodium: 138 mEq/L (ref 135–145)
Total Bilirubin: 0.5 mg/dL (ref 0.2–1.2)
Total Protein: 7.4 g/dL (ref 6.0–8.3)

## 2015-08-22 LAB — CBC WITH DIFFERENTIAL/PLATELET
BASOS ABS: 0.1 10*3/uL (ref 0.0–0.1)
Basophils Relative: 0.7 % (ref 0.0–3.0)
Eosinophils Absolute: 0.5 10*3/uL (ref 0.0–0.7)
Eosinophils Relative: 5.8 % — ABNORMAL HIGH (ref 0.0–5.0)
HEMATOCRIT: 38.9 % (ref 36.0–46.0)
Hemoglobin: 13.1 g/dL (ref 12.0–15.0)
LYMPHS PCT: 33.7 % (ref 12.0–46.0)
Lymphs Abs: 2.8 10*3/uL (ref 0.7–4.0)
MCHC: 33.8 g/dL (ref 30.0–36.0)
MCV: 92.7 fl (ref 78.0–100.0)
MONOS PCT: 8.9 % (ref 3.0–12.0)
Monocytes Absolute: 0.7 10*3/uL (ref 0.1–1.0)
NEUTROS PCT: 50.9 % (ref 43.0–77.0)
Neutro Abs: 4.2 10*3/uL (ref 1.4–7.7)
Platelets: 343 10*3/uL (ref 150.0–400.0)
RBC: 4.2 Mil/uL (ref 3.87–5.11)
RDW: 13.4 % (ref 11.5–15.5)
WBC: 8.2 10*3/uL (ref 4.0–10.5)

## 2015-08-22 LAB — MICROALBUMIN / CREATININE URINE RATIO
CREATININE, U: 100.4 mg/dL
MICROALB/CREAT RATIO: 0.9 mg/g (ref 0.0–30.0)
Microalb, Ur: 0.9 mg/dL (ref 0.0–1.9)

## 2015-08-22 NOTE — Patient Instructions (Signed)
Health Maintenance, Female Adopting a healthy lifestyle and getting preventive care can go a long way to promote health and wellness. Talk with your health care provider about what schedule of regular examinations is right for you. This is a good chance for you to check in with your provider about disease prevention and staying healthy. In between checkups, there are plenty of things you can do on your own. Experts have done a lot of research about which lifestyle changes and preventive measures are most likely to keep you healthy. Ask your health care provider for more information. WEIGHT AND DIET  Eat a healthy diet  Be sure to include plenty of vegetables, fruits, low-fat dairy products, and lean protein.  Do not eat a lot of foods high in solid fats, added sugars, or salt.  Get regular exercise. This is one of the most important things you can do for your health.  Most adults should exercise for at least 150 minutes each week. The exercise should increase your heart rate and make you sweat (moderate-intensity exercise).  Most adults should also do strengthening exercises at least twice a week. This is in addition to the moderate-intensity exercise.  Maintain a healthy weight  Body mass index (BMI) is a measurement that can be used to identify possible weight problems. It estimates body fat based on height and weight. Your health care provider can help determine your BMI and help you achieve or maintain a healthy weight.  For females 20 years of age and older:   A BMI below 18.5 is considered underweight.  A BMI of 18.5 to 24.9 is normal.  A BMI of 25 to 29.9 is considered overweight.  A BMI of 30 and above is considered obese.  Watch levels of cholesterol and blood lipids  You should start having your blood tested for lipids and cholesterol at 76 years of age, then have this test every 5 years.  You may need to have your cholesterol levels checked more often if:  Your lipid  or cholesterol levels are high.  You are older than 76 years of age.  You are at high risk for heart disease.  CANCER SCREENING   Lung Cancer  Lung cancer screening is recommended for adults 55-80 years old who are at high risk for lung cancer because of a history of smoking.  A yearly low-dose CT scan of the lungs is recommended for people who:  Currently smoke.  Have quit within the past 15 years.  Have at least a 30-pack-year history of smoking. A pack year is smoking an average of one pack of cigarettes a day for 1 year.  Yearly screening should continue until it has been 15 years since you quit.  Yearly screening should stop if you develop a health problem that would prevent you from having lung cancer treatment.  Breast Cancer  Practice breast self-awareness. This means understanding how your breasts normally appear and feel.  It also means doing regular breast self-exams. Let your health care provider know about any changes, no matter how small.  If you are in your 20s or 30s, you should have a clinical breast exam (CBE) by a health care provider every 1-3 years as part of a regular health exam.  If you are 40 or older, have a CBE every year. Also consider having a breast X-ray (mammogram) every year.  If you have a family history of breast cancer, talk to your health care provider about genetic screening.  If you   are at high risk for breast cancer, talk to your health care provider about having an MRI and a mammogram every year.  Breast cancer gene (BRCA) assessment is recommended for women who have family members with BRCA-related cancers. BRCA-related cancers include:  Breast.  Ovarian.  Tubal.  Peritoneal cancers.  Results of the assessment will determine the need for genetic counseling and BRCA1 and BRCA2 testing. Cervical Cancer Your health care provider may recommend that you be screened regularly for cancer of the pelvic organs (ovaries, uterus, and  vagina). This screening involves a pelvic examination, including checking for microscopic changes to the surface of your cervix (Pap test). You may be encouraged to have this screening done every 3 years, beginning at age 21.  For women ages 30-65, health care providers may recommend pelvic exams and Pap testing every 3 years, or they may recommend the Pap and pelvic exam, combined with testing for human papilloma virus (HPV), every 5 years. Some types of HPV increase your risk of cervical cancer. Testing for HPV may also be done on women of any age with unclear Pap test results.  Other health care providers may not recommend any screening for nonpregnant women who are considered low risk for pelvic cancer and who do not have symptoms. Ask your health care provider if a screening pelvic exam is right for you.  If you have had past treatment for cervical cancer or a condition that could lead to cancer, you need Pap tests and screening for cancer for at least 20 years after your treatment. If Pap tests have been discontinued, your risk factors (such as having a new sexual partner) need to be reassessed to determine if screening should resume. Some women have medical problems that increase the chance of getting cervical cancer. In these cases, your health care provider may recommend more frequent screening and Pap tests. Colorectal Cancer  This type of cancer can be detected and often prevented.  Routine colorectal cancer screening usually begins at 76 years of age and continues through 75 years of age.  Your health care provider may recommend screening at an earlier age if you have risk factors for colon cancer.  Your health care provider may also recommend using home test kits to check for hidden blood in the stool.  A small camera at the end of a tube can be used to examine your colon directly (sigmoidoscopy or colonoscopy). This is done to check for the earliest forms of colorectal  cancer.  Routine screening usually begins at age 50.  Direct examination of the colon should be repeated every 5-10 years through 75 years of age. However, you may need to be screened more often if early forms of precancerous polyps or small growths are found. Skin Cancer  Check your skin from head to toe regularly.  Tell your health care provider about any new moles or changes in moles, especially if there is a change in a mole's shape or color.  Also tell your health care provider if you have a mole that is larger than the size of a pencil eraser.  Always use sunscreen. Apply sunscreen liberally and repeatedly throughout the day.  Protect yourself by wearing long sleeves, pants, a wide-brimmed hat, and sunglasses whenever you are outside. HEART DISEASE, DIABETES, AND HIGH BLOOD PRESSURE   High blood pressure causes heart disease and increases the risk of stroke. High blood pressure is more likely to develop in:  People who have blood pressure in the high end   of the normal range (130-139/85-89 mm Hg).  People who are overweight or obese.  People who are African American.  If you are 38-23 years of age, have your blood pressure checked every 3-5 years. If you are 61 years of age or older, have your blood pressure checked every year. You should have your blood pressure measured twice--once when you are at a hospital or clinic, and once when you are not at a hospital or clinic. Record the average of the two measurements. To check your blood pressure when you are not at a hospital or clinic, you can use:  An automated blood pressure machine at a pharmacy.  A home blood pressure monitor.  If you are between 45 years and 39 years old, ask your health care provider if you should take aspirin to prevent strokes.  Have regular diabetes screenings. This involves taking a blood sample to check your fasting blood sugar level.  If you are at a normal weight and have a low risk for diabetes,  have this test once every three years after 76 years of age.  If you are overweight and have a high risk for diabetes, consider being tested at a younger age or more often. PREVENTING INFECTION  Hepatitis B  If you have a higher risk for hepatitis B, you should be screened for this virus. You are considered at high risk for hepatitis B if:  You were born in a country where hepatitis B is common. Ask your health care provider which countries are considered high risk.  Your parents were born in a high-risk country, and you have not been immunized against hepatitis B (hepatitis B vaccine).  You have HIV or AIDS.  You use needles to inject street drugs.  You live with someone who has hepatitis B.  You have had sex with someone who has hepatitis B.  You get hemodialysis treatment.  You take certain medicines for conditions, including cancer, organ transplantation, and autoimmune conditions. Hepatitis C  Blood testing is recommended for:  Everyone born from [birth year] through 1966.  Anyone with known risk factors for hepatitis C. Sexually transmitted infections (STIs)  You should be screened for sexually transmitted infections (STIs) including gonorrhea and chlamydia if:  You are sexually active and are younger than 76 years of age.  You are older than 76 years of age and your health care provider tells you that you are at risk for this type of infection.  Your sexual activity has changed since you were last screened and you are at an increased risk for chlamydia or gonorrhea. Ask your health care provider if you are at risk.  If you do not have HIV, but are at risk, it may be recommended that you take a prescription medicine daily to prevent HIV infection. This is called pre-exposure prophylaxis (PrEP). You are considered at risk if:  You are sexually active and do not regularly use condoms or know the HIV status of your partner(s).  You take drugs by injection.  You are sexually  active with a partner who has HIV. Talk with your health care provider about whether you are at high risk of being infected with HIV. If you choose to begin PrEP, you should first be tested for HIV. You should then be tested every 3 months for as long as you are taking PrEP.  PREGNANCY   If you are premenopausal and you may become pregnant, ask your health care provider about preconception counseling.  If you may  become pregnant, take 400 to 800 micrograms (mcg) of folic acid every day.  If you want to prevent pregnancy, talk to your health care provider about birth control (contraception). OSTEOPOROSIS AND MENOPAUSE   Osteoporosis is a disease in which the bones lose minerals and strength with aging. This can result in serious bone fractures. Your risk for osteoporosis can be identified using a bone density scan.  If you are 18 years of age or older, or if you are at risk for osteoporosis and fractures, ask your health care provider if you should be screened.  Ask your health care provider whether you should take a calcium or vitamin D supplement to lower your risk for osteoporosis.  Menopause may have certain physical symptoms and risks.  Hormone replacement therapy may reduce some of these symptoms and risks. Talk to your health care provider about whether hormone replacement therapy is right for you.  HOME CARE INSTRUCTIONS   Schedule regular health, dental, and eye exams.  Stay current with your immunizations.   Do not use any tobacco products including cigarettes, chewing tobacco, or electronic cigarettes.  If you are pregnant, do not drink alcohol.  If you are breastfeeding, limit how much and how often you drink alcohol.  Limit alcohol intake to no more than 1 drink per day for nonpregnant women. One drink equals 12 ounces of beer, 5 ounces of wine, or 1 ounces of hard liquor.  Do not use street drugs.  Do not share needles.  Ask your health care provider for help if  you need support or information about quitting drugs.  Tell your health care provider if you often feel depressed.  Tell your health care provider if you have ever been abused or do not feel safe at home.   This information is not intended to replace advice given to you by your health care provider. Make sure you discuss any questions you have with your health care provider.   Document Released: 11/18/2010 Document Revised: 05/26/2014 Document Reviewed: 04/06/2013 Elsevier Interactive Patient Education 2016 [place] in May.  I have ordered your mammogram. I will call you with labs once results available.

## 2015-08-22 NOTE — Telephone Encounter (Signed)
Please call pt: - her labs are all normal.  
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
## 2015-08-23 NOTE — Telephone Encounter (Signed)
Left message with lab results on patient voice mail 

## 2015-08-27 ENCOUNTER — Other Ambulatory Visit: Payer: Self-pay

## 2015-08-27 ENCOUNTER — Ambulatory Visit (HOSPITAL_COMMUNITY): Payer: Medicare Other | Attending: Cardiovascular Disease

## 2015-08-27 DIAGNOSIS — I5181 Takotsubo syndrome: Secondary | ICD-10-CM

## 2015-08-27 DIAGNOSIS — I119 Hypertensive heart disease without heart failure: Secondary | ICD-10-CM | POA: Insufficient documentation

## 2015-08-28 ENCOUNTER — Encounter: Payer: Self-pay | Admitting: Family Medicine

## 2015-08-28 DIAGNOSIS — N816 Rectocele: Secondary | ICD-10-CM | POA: Insufficient documentation

## 2015-08-28 HISTORY — DX: Rectocele: N81.6

## 2015-09-05 DIAGNOSIS — M2041 Other hammer toe(s) (acquired), right foot: Secondary | ICD-10-CM | POA: Diagnosis not present

## 2015-09-05 DIAGNOSIS — M21611 Bunion of right foot: Secondary | ICD-10-CM | POA: Diagnosis not present

## 2015-09-11 ENCOUNTER — Telehealth: Payer: Self-pay | Admitting: Cardiovascular Disease

## 2015-09-11 NOTE — Telephone Encounter (Signed)
Returned call to patient.She stated she needed Dr.Kelly to clear her for upcoming bunionectomy with [facility].Stated surgery cannot be scheduled until [facility] clears her.Spoke to [place] she just received clearance.She will have Dr.Kelly sign clearance tomorrow 09/12/15 and will fax to [facility].

## 2015-09-11 NOTE — Telephone Encounter (Signed)
NewMessage  Pt following up on clearance for surgery w/ Dr Doran Durand. She stated that a clearance was sent already to Dr [facility] office but is wanting to know th status. Please call back and discuss.

## 2015-09-12 ENCOUNTER — Telehealth: Payer: Self-pay | Admitting: *Deleted

## 2015-09-12 DIAGNOSIS — M810 Age-related osteoporosis without current pathological fracture: Secondary | ICD-10-CM

## 2015-09-12 NOTE — Telephone Encounter (Signed)
Patient called she states she wants to get her Prolia injections here we need to get prior authorization from her [employer]. She has been getting them in the past but under a different Dr unsure when her authorization was done with them . If you order the injections I will sent request to [facility] to get authorized. Please advise.

## 2015-09-13 ENCOUNTER — Telehealth: Payer: Self-pay | Admitting: Cardiovascular Disease

## 2015-09-13 MED ORDER — DENOSUMAB 60 MG/ML ~~LOC~~ SOLN: 60.0000 mg | SUBCUTANEOUS | Status: DC

## 2015-09-13 NOTE — Telephone Encounter (Signed)
Pt calling to check on status of fax for surgical clearance -surgery on foot-Dr. Hewitt-pls call [phone] -ok to leave message

## 2015-09-13 NOTE — Telephone Encounter (Signed)
Ordered prolia today.

## 2015-09-14 NOTE — Telephone Encounter (Signed)
Follow up     Pt calling to check on status of fax for surgical clearance -surgery on foot-Dr. Hewitt-pls call [phone] -ok to leave message

## 2015-09-14 NOTE — Telephone Encounter (Signed)
Follow Up  Pt calling for Surgical Clearance. This has been on going for about two weeks.  Request for surgical clearance:  1. What type of surgery is being performed? Foot surgery  2. When is this surgery scheduled? Not scheduled yet. The clearance is holding up the surgery   3. Are there any medications that need to be held prior to surgery and how long? Pt is not sure   Name of physician performing surgery? Dr. Doran Durand with [facility]   What is your office phone and fax number? [phone] Telephone number. Pt doesn't  Have the fax number

## 2015-09-14 NOTE — Telephone Encounter (Signed)
FORWARD TO DR Claiborne Billings

## 2015-09-14 NOTE — Telephone Encounter (Signed)
Patient called and left message stating she had scheduled an appt for her Prolia injection for Tuesday of next week. I called patient back and left message letting her know we do not have authorization from her insurance company yet and need that prior to scheduling her for an injection. We will call patient to schedule once approval received . We need to order Prolia prior to her appt we do not stock this in office.

## 2015-09-15 ENCOUNTER — Other Ambulatory Visit: Payer: Self-pay | Admitting: Cardiovascular Disease

## 2015-09-17 ENCOUNTER — Encounter: Payer: Self-pay | Admitting: *Deleted

## 2015-09-17 NOTE — Telephone Encounter (Signed)
Called patient and informed her that her clearance was faxed over to the orthopedics office on 09/13/15 to the number provided by their office. Clifford. I will refax it again today.

## 2015-09-18 ENCOUNTER — Ambulatory Visit: Payer: Medicare Other

## 2015-09-19 NOTE — Telephone Encounter (Signed)
I have electronically submitted pt's info for Prolia insurance verification and will notify you once I have a response. Thank you. °

## 2015-09-25 DIAGNOSIS — M216X1 Other acquired deformities of right foot: Secondary | ICD-10-CM | POA: Diagnosis not present

## 2015-09-25 DIAGNOSIS — M2041 Other hammer toe(s) (acquired), right foot: Secondary | ICD-10-CM | POA: Diagnosis not present

## 2015-09-25 DIAGNOSIS — G8918 Other acute postprocedural pain: Secondary | ICD-10-CM | POA: Diagnosis not present

## 2015-09-25 DIAGNOSIS — M7741 Metatarsalgia, right foot: Secondary | ICD-10-CM | POA: Diagnosis not present

## 2015-09-25 DIAGNOSIS — M2011 Hallux valgus (acquired), right foot: Secondary | ICD-10-CM | POA: Diagnosis not present

## 2015-09-25 DIAGNOSIS — M21611 Bunion of right foot: Secondary | ICD-10-CM | POA: Diagnosis not present

## 2015-10-04 NOTE — Telephone Encounter (Signed)
I have rec'd insurance verification for Angela Preston's Prolia injection and it says that both of her insurances are saying they are secondaries.  Someone needs to contact her and ask her to call the insurance that should be primary for Coordination of Benefits (COB).  From looking at her chart, I would say Medicare Part A and B is primary.  Once she has contacted them and gotten that correct, I will resubmit a verification.  If you have any questions, please let me know.  Thank you!

## 2015-10-08 DIAGNOSIS — Z4789 Encounter for other orthopedic aftercare: Secondary | ICD-10-CM | POA: Diagnosis not present

## 2015-10-11 NOTE — Telephone Encounter (Signed)
Notified patient she will contact insurance company and let us know when it is straightened out so we can resubmit. Medicare should be primary.

## 2015-10-12 NOTE — Telephone Encounter (Signed)
Patient contacted medicare and whoever she talked to stated that she was covered and if there was any questions to call [phone].  Please route this to appropriate person.

## 2015-10-16 ENCOUNTER — Telehealth: Payer: Self-pay | Admitting: *Deleted

## 2015-10-16 NOTE — Telephone Encounter (Signed)
Sent information regarding Prolia coverage to [employer] for confirmation.

## 2015-10-19 ENCOUNTER — Telehealth: Payer: Self-pay | Admitting: *Deleted

## 2015-10-19 NOTE — Telephone Encounter (Signed)
I will have to run another insurance verification, but I need to know is Medicare supposed to be primary? Or UHC? Once I know that, I will be able to run another verification.  Thank you!

## 2015-10-19 NOTE — Telephone Encounter (Signed)
error 

## 2015-10-22 NOTE — Telephone Encounter (Signed)
Patient has additional info

## 2015-10-22 NOTE — Telephone Encounter (Signed)
Please contact patient

## 2015-10-24 ENCOUNTER — Telehealth: Payer: Self-pay | Admitting: *Deleted

## 2015-10-24 NOTE — Telephone Encounter (Signed)
error 

## 2015-10-24 NOTE — Telephone Encounter (Signed)
Medicare is primary. UHC secondary.

## 2015-10-24 NOTE — Telephone Encounter (Signed)
I have resubmitted her info for ins verification and will let you know once I have a response.  Thank you.

## 2015-10-26 NOTE — Telephone Encounter (Signed)
Prolia rep called today and stated that est of benefits shows that she will be responsible for about $225. Patient aware and is okay with price point, states that's what she paid in the past.  Patient schedule injection 10/29/15.

## 2015-10-29 ENCOUNTER — Ambulatory Visit (INDEPENDENT_AMBULATORY_CARE_PROVIDER_SITE_OTHER): Payer: Medicare Other | Admitting: *Deleted

## 2015-10-29 DIAGNOSIS — M810 Age-related osteoporosis without current pathological fracture: Secondary | ICD-10-CM

## 2015-10-29 MED ORDER — DENOSUMAB 60 MG/ML ~~LOC~~ SOLN
60.0000 mg | Freq: Once | SUBCUTANEOUS | Status: AC
  Administered 2015-10-29: 60 mg via SUBCUTANEOUS

## 2015-10-29 NOTE — Progress Notes (Signed)
Patient presents today for a Prolia injection. Patient tolerated injection well.

## 2015-11-05 DIAGNOSIS — Z4789 Encounter for other orthopedic aftercare: Secondary | ICD-10-CM | POA: Diagnosis not present

## 2015-12-03 DIAGNOSIS — Z4789 Encounter for other orthopedic aftercare: Secondary | ICD-10-CM | POA: Diagnosis not present

## 2016-01-01 DIAGNOSIS — H2513 Age-related nuclear cataract, bilateral: Secondary | ICD-10-CM | POA: Diagnosis not present

## 2016-01-01 DIAGNOSIS — H40033 Anatomical narrow angle, bilateral: Secondary | ICD-10-CM | POA: Diagnosis not present

## 2016-01-03 ENCOUNTER — Other Ambulatory Visit: Payer: Self-pay | Admitting: *Deleted

## 2016-01-03 MED ORDER — LOSARTAN POTASSIUM 100 MG PO TABS: 100.0000 mg | ORAL_TABLET | Freq: Every day | ORAL | 1 refills | Status: DC

## 2016-01-07 ENCOUNTER — Other Ambulatory Visit: Payer: Self-pay | Admitting: *Deleted

## 2016-01-07 MED ORDER — LOSARTAN POTASSIUM 100 MG PO TABS: 100.0000 mg | ORAL_TABLET | Freq: Every day | ORAL | 0 refills | Status: DC

## 2016-01-09 ENCOUNTER — Encounter (INDEPENDENT_AMBULATORY_CARE_PROVIDER_SITE_OTHER): Payer: Medicare Other | Admitting: Ophthalmology

## 2016-01-09 DIAGNOSIS — H35033 Hypertensive retinopathy, bilateral: Secondary | ICD-10-CM

## 2016-01-09 DIAGNOSIS — H35373 Puckering of macula, bilateral: Secondary | ICD-10-CM | POA: Diagnosis not present

## 2016-01-09 DIAGNOSIS — H43813 Vitreous degeneration, bilateral: Secondary | ICD-10-CM | POA: Diagnosis not present

## 2016-01-09 DIAGNOSIS — I10 Essential (primary) hypertension: Secondary | ICD-10-CM

## 2016-01-23 ENCOUNTER — Encounter (INDEPENDENT_AMBULATORY_CARE_PROVIDER_SITE_OTHER): Payer: Self-pay | Admitting: Ophthalmology

## 2016-01-24 ENCOUNTER — Telehealth: Payer: Self-pay

## 2016-01-24 DIAGNOSIS — M810 Age-related osteoporosis without current pathological fracture: Secondary | ICD-10-CM

## 2016-01-24 NOTE — Telephone Encounter (Signed)
Received pharmacy request for Baclofen, not currently on patient medication list. Patient last seen 4/17. Please advise.

## 2016-01-25 MED ORDER — BACLOFEN 10 MG PO TABS: 10.0000 mg | ORAL_TABLET | Freq: Two times a day (BID) | ORAL | 0 refills | Status: DC

## 2016-01-25 NOTE — Telephone Encounter (Signed)
Spoke to patient she stated that she did not request a refill and will probably not pick up medication.

## 2016-01-25 NOTE — Telephone Encounter (Signed)
Baclofen called in for her, if needing refills will need to be seen for condition.  Thanks.

## 2016-02-04 DIAGNOSIS — H35372 Puckering of macula, left eye: Secondary | ICD-10-CM | POA: Diagnosis not present

## 2016-02-04 DIAGNOSIS — H26492 Other secondary cataract, left eye: Secondary | ICD-10-CM | POA: Diagnosis not present

## 2016-02-04 DIAGNOSIS — H26491 Other secondary cataract, right eye: Secondary | ICD-10-CM | POA: Diagnosis not present

## 2016-02-04 DIAGNOSIS — H43812 Vitreous degeneration, left eye: Secondary | ICD-10-CM | POA: Diagnosis not present

## 2016-02-04 DIAGNOSIS — H35371 Puckering of macula, right eye: Secondary | ICD-10-CM | POA: Diagnosis not present

## 2016-02-18 DIAGNOSIS — H26491 Other secondary cataract, right eye: Secondary | ICD-10-CM | POA: Diagnosis not present

## 2016-02-26 ENCOUNTER — Encounter: Payer: Self-pay | Admitting: Cardiovascular Disease

## 2016-02-26 ENCOUNTER — Ambulatory Visit (INDEPENDENT_AMBULATORY_CARE_PROVIDER_SITE_OTHER): Payer: Medicare Other | Admitting: Cardiovascular Disease

## 2016-02-26 VITALS — BP 120/74 | HR 52 | Ht 61.0 in | Wt 136.4 lb

## 2016-02-26 DIAGNOSIS — Z0389 Encounter for observation for other suspected diseases and conditions ruled out: Secondary | ICD-10-CM | POA: Diagnosis not present

## 2016-02-26 DIAGNOSIS — I5181 Takotsubo syndrome: Secondary | ICD-10-CM | POA: Diagnosis not present

## 2016-02-26 DIAGNOSIS — I10 Essential (primary) hypertension: Secondary | ICD-10-CM | POA: Diagnosis not present

## 2016-02-26 DIAGNOSIS — IMO0001 Reserved for inherently not codable concepts without codable children: Secondary | ICD-10-CM

## 2016-02-26 DIAGNOSIS — E785 Hyperlipidemia, unspecified: Secondary | ICD-10-CM

## 2016-02-26 NOTE — Patient Instructions (Signed)
Medication Instructions:  Cut Carvedilol in 1/2 twice a day for 5 days, then take 1/2 daily for 5 days then STOP  Labwork:  None Ordered  Testing/Procedures:  None Ordered  Follow-Up:  Your physician wants you to follow-up in: 1 Year. You will receive a reminder letter in the mail two months in advance. If you don't receive a letter, please call our office to schedule the follow-up appointment.   Any Other Special Instructions Will Be Listed Below (If Applicable).
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
## 2016-03-04 DIAGNOSIS — H35371 Puckering of macula, right eye: Secondary | ICD-10-CM | POA: Diagnosis not present

## 2016-03-04 DIAGNOSIS — H35372 Puckering of macula, left eye: Secondary | ICD-10-CM | POA: Diagnosis not present

## 2016-03-04 DIAGNOSIS — H26492 Other secondary cataract, left eye: Secondary | ICD-10-CM | POA: Diagnosis not present

## 2016-03-04 DIAGNOSIS — H43812 Vitreous degeneration, left eye: Secondary | ICD-10-CM | POA: Diagnosis not present

## 2016-04-15 DIAGNOSIS — H40033 Anatomical narrow angle, bilateral: Secondary | ICD-10-CM | POA: Diagnosis not present

## 2016-04-15 DIAGNOSIS — G44219 Episodic tension-type headache, not intractable: Secondary | ICD-10-CM | POA: Diagnosis not present

## 2016-04-29 ENCOUNTER — Ambulatory Visit (INDEPENDENT_AMBULATORY_CARE_PROVIDER_SITE_OTHER): Payer: Medicare Other | Admitting: Family Medicine

## 2016-04-29 DIAGNOSIS — M810 Age-related osteoporosis without current pathological fracture: Secondary | ICD-10-CM

## 2016-04-29 MED ORDER — DENOSUMAB 60 MG/ML ~~LOC~~ SOLN
60.0000 mg | Freq: Once | SUBCUTANEOUS | Status: AC
  Administered 2016-04-29: 60 mg via SUBCUTANEOUS

## 2016-04-29 NOTE — Progress Notes (Signed)
Electronically Signed by: Howard Pouch, DO [facility] [place]

## 2016-04-29 NOTE — Progress Notes (Signed)
Patient presented today for prolia injection.  Patient tolerated well.

## 2016-05-05 ENCOUNTER — Ambulatory Visit
Admission: RE | Admit: 2016-05-05 | Discharge: 2016-05-05 | Disposition: A | Discharge status: Home / Self Care | Payer: Medicare Other | Source: Ambulatory Visit | Attending: Family Medicine | Admitting: Family Medicine

## 2016-05-05 DIAGNOSIS — Z1231 Encounter for screening mammogram for malignant neoplasm of breast: Secondary | ICD-10-CM | POA: Diagnosis not present

## 2016-05-05 DIAGNOSIS — Z1239 Encounter for other screening for malignant neoplasm of breast: Secondary | ICD-10-CM

## 2016-06-23 ENCOUNTER — Telehealth: Payer: Self-pay | Admitting: Family Medicine

## 2016-06-23 NOTE — Telephone Encounter (Signed)
I have reviewed my records and she did have a complete exam 08/22/2015, in which she had labs collected (blood and urine), mammogram ordered and immunizations given and a hands on exam. That is the definition of CPE.  She is either mistaken, or their may be a miscommunication on what she expects from a physical. Maybe she means Pelvic/PAP? Which would not be indicated > 76 years old.

## 2016-06-23 NOTE — Telephone Encounter (Signed)
Pt calling to schedule a CPE stating that she had not had one in years, let pt know that she had one in 08/2015. Pt states that she did not have CPE just received a shot and wants a call back regarding this.

## 2016-07-05 ENCOUNTER — Other Ambulatory Visit: Payer: Self-pay | Admitting: Internal Medicine

## 2016-07-30 ENCOUNTER — Telehealth: Payer: Self-pay | Admitting: Family Medicine

## 2016-07-30 NOTE — Telephone Encounter (Signed)
Patient is calling to request a dermatology referral.  Thank you,  -LL

## 2016-07-30 NOTE — Telephone Encounter (Signed)
Scheduled patient to be evaluated by Dr Raoul Pitch for dermatology referral

## 2016-08-21 NOTE — Progress Notes (Signed)
Pre visit review using our clinic review tool, if applicable. No additional management support is needed unless otherwise documented below in the visit note. 
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
## 2016-08-22 ENCOUNTER — Ambulatory Visit (INDEPENDENT_AMBULATORY_CARE_PROVIDER_SITE_OTHER): Payer: Medicare Other | Admitting: Family Medicine

## 2016-08-22 ENCOUNTER — Encounter: Payer: Self-pay | Admitting: Family Medicine

## 2016-08-22 ENCOUNTER — Ambulatory Visit: Payer: Medicare Other

## 2016-08-22 VITALS — BP 145/82 | HR 64 | Temp 98.1°F | Resp 18 | Ht 61.0 in | Wt 149.8 lb

## 2016-08-22 DIAGNOSIS — Z0000 Encounter for general adult medical examination without abnormal findings: Secondary | ICD-10-CM

## 2016-08-22 DIAGNOSIS — L989 Disorder of the skin and subcutaneous tissue, unspecified: Secondary | ICD-10-CM | POA: Diagnosis not present

## 2016-08-22 NOTE — Patient Instructions (Addendum)
I have referred you to dermatology, they will call you to schedule soon.  It was great to see you today.   Continue to eat heart healthy diet (full of fruits, vegetables, whole grains, lean protein, water--limit salt, fat, and sugar intake) and increase physical activity as tolerated.  Continue doing brain stimulating activities (puzzles, reading, adult coloring books, staying active) to keep memory sharp.   Bring a copy of your advance directives to your next office visit.  Will schedule bone scan in December.   Angela Preston , Thank you for taking time to come for your Medicare Wellness Visit. I appreciate your ongoing commitment to your health goals. Please review the following plan we discussed and let me know if I can assist you in the future.   These are the goals we discussed: Goals    . Exercise 150 minutes per week (moderate activity)          Increase walking.        This is a list of the screening recommended for you and due dates:  Health Maintenance  Topic Date Due  . DEXA scan (bone density measurement)  05/20/2015  . Pneumonia vaccines (2 of 2 - PPSV23) 08/21/2016  . Flu Shot  12/17/2016  . Colon Cancer Screening  07/17/2017  . Mammogram  05/05/2018  . Tetanus Vaccine  05/19/2021     Health Maintenance, Female Adopting a healthy lifestyle and getting preventive care can go a long way to promote health and wellness. Talk with your health care provider about what schedule of regular examinations is right for you. This is a good chance for you to check in with your provider about disease prevention and staying healthy. In between checkups, there are plenty of things you can do on your own. Experts have done a lot of research about which lifestyle changes and preventive measures are most likely to keep you healthy. Ask your health care provider for more information. Weight and diet Eat a healthy diet  Be sure to include plenty of vegetables, fruits, low-fat dairy  products, and lean protein.  Do not eat a lot of foods high in solid fats, added sugars, or salt.  Get regular exercise. This is one of the most important things you can do for your health.  Most adults should exercise for at least 150 minutes each week. The exercise should increase your heart rate and make you sweat (moderate-intensity exercise).  Most adults should also do strengthening exercises at least twice a week. This is in addition to the moderate-intensity exercise. Maintain a healthy weight  Body mass index (BMI) is a measurement that can be used to identify possible weight problems. It estimates body fat based on height and weight. Your health care provider can help determine your BMI and help you achieve or maintain a healthy weight.  For females 29 years of age and older:  A BMI below 18.5 is considered underweight.  A BMI of 18.5 to 24.9 is normal.  A BMI of 25 to 29.9 is considered overweight.  A BMI of 30 and above is considered obese. Watch levels of cholesterol and blood lipids  You should start having your blood tested for lipids and cholesterol at 77 years of age, then have this test every 5 years.  You may need to have your cholesterol levels checked more often if:  Your lipid or cholesterol levels are high.  You are older than 77 years of age.  You are at high risk  for heart disease. Cancer screening Lung Cancer  Lung cancer screening is recommended for adults 76-80 years old who are at high risk for lung cancer because of a history of smoking.  A yearly low-dose CT scan of the lungs is recommended for people who:  Currently smoke.  Have quit within the past 15 years.  Have at least a 30-pack-year history of smoking. A pack year is smoking an average of one pack of cigarettes a day for 1 year.  Yearly screening should continue until it has been 15 years since you quit.  Yearly screening should stop if you develop a health problem that would  prevent you from having lung cancer treatment. Breast Cancer  Practice breast self-awareness. This means understanding how your breasts normally appear and feel.  It also means doing regular breast self-exams. Let your health care provider know about any changes, no matter how small.  If you are in your 20s or 30s, you should have a clinical breast exam (CBE) by a health care provider every 1-3 years as part of a regular health exam.  If you are 35 or older, have a CBE every year. Also consider having a breast X-ray (mammogram) every year.  If you have a family history of breast cancer, talk to your health care provider about genetic screening.  If you are at high risk for breast cancer, talk to your health care provider about having an MRI and a mammogram every year.  Breast cancer gene (BRCA) assessment is recommended for women who have family members with BRCA-related cancers. BRCA-related cancers include:  Breast.  Ovarian.  Tubal.  Peritoneal cancers.  Results of the assessment will determine the need for genetic counseling and BRCA1 and BRCA2 testing. Cervical Cancer  Your health care provider may recommend that you be screened regularly for cancer of the pelvic organs (ovaries, uterus, and vagina). This screening involves a pelvic examination, including checking for microscopic changes to the surface of your cervix (Pap test). You may be encouraged to have this screening done every 3 years, beginning at age 21.  For women ages 65-65, health care providers may recommend pelvic exams and Pap testing every 3 years, or they may recommend the Pap and pelvic exam, combined with testing for human papilloma virus (HPV), every 5 years. Some types of HPV increase your risk of cervical cancer. Testing for HPV may also be done on women of any age with unclear Pap test results.  Other health care providers may not recommend any screening for nonpregnant women who are considered low risk for  pelvic cancer and who do not have symptoms. Ask your health care provider if a screening pelvic exam is right for you.  If you have had past treatment for cervical cancer or a condition that could lead to cancer, you need Pap tests and screening for cancer for at least 20 years after your treatment. If Pap tests have been discontinued, your risk factors (such as having a new sexual partner) need to be reassessed to determine if screening should resume. Some women have medical problems that increase the chance of getting cervical cancer. In these cases, your health care provider may recommend more frequent screening and Pap tests. Colorectal Cancer  This type of cancer can be detected and often prevented.  Routine colorectal cancer screening usually begins at 77 years of age and continues through 77 years of age.  Your health care provider may recommend screening at an earlier age if you have risk  factors for colon cancer.  Your health care provider may also recommend using home test kits to check for hidden blood in the stool.  A small camera at the end of a tube can be used to examine your colon directly (sigmoidoscopy or colonoscopy). This is done to check for the earliest forms of colorectal cancer.  Routine screening usually begins at age 19.  Direct examination of the colon should be repeated every 5-10 years through 77 years of age. However, you may need to be screened more often if early forms of precancerous polyps or small growths are found. Skin Cancer  Check your skin from head to toe regularly.  Tell your health care provider about any new moles or changes in moles, especially if there is a change in a mole's shape or color.  Also tell your health care provider if you have a mole that is larger than the size of a pencil eraser.  Always use sunscreen. Apply sunscreen liberally and repeatedly throughout the day.  Protect yourself by wearing long sleeves, pants, a wide-brimmed  hat, and sunglasses whenever you are outside. Heart disease, diabetes, and high blood pressure  High blood pressure causes heart disease and increases the risk of stroke. High blood pressure is more likely to develop in:  People who have blood pressure in the high end of the normal range (130-139/85-89 mm Hg).  People who are overweight or obese.  People who are African American.  If you are 37-8 years of age, have your blood pressure checked every 3-5 years. If you are 9 years of age or older, have your blood pressure checked every year. You should have your blood pressure measured twice-once when you are at a hospital or clinic, and once when you are not at a hospital or clinic. Record the average of the two measurements. To check your blood pressure when you are not at a hospital or clinic, you can use:  An automated blood pressure machine at a pharmacy.  A home blood pressure monitor.  If you are between 50 years and 77 years old, ask your health care provider if you should take aspirin to prevent strokes.  Have regular diabetes screenings. This involves taking a blood sample to check your fasting blood sugar level.  If you are at a normal weight and have a low risk for diabetes, have this test once every three years after 77 years of age.  If you are overweight and have a high risk for diabetes, consider being tested at a younger age or more often. Preventing infection Hepatitis B  If you have a higher risk for hepatitis B, you should be screened for this virus. You are considered at high risk for hepatitis B if:  You were born in a country where hepatitis B is common. Ask your health care provider which countries are considered high risk.  Your parents were born in a high-risk country, and you have not been immunized against hepatitis B (hepatitis B vaccine).  You have HIV or AIDS.  You use needles to inject street drugs.  You live with someone who has hepatitis B.  You  have had sex with someone who has hepatitis B.  You get hemodialysis treatment.  You take certain medicines for conditions, including cancer, organ transplantation, and autoimmune conditions. Hepatitis C  Blood testing is recommended for:  Everyone born from [birth year] through 1966.  Anyone with known risk factors for hepatitis C. Sexually transmitted infections (STIs)  You should be  screened for sexually transmitted infections (STIs) including gonorrhea and chlamydia if:  You are sexually active and are younger than 77 years of age.  You are older than 77 years of age and your health care provider tells you that you are at risk for this type of infection.  Your sexual activity has changed since you were last screened and you are at an increased risk for chlamydia or gonorrhea. Ask your health care provider if you are at risk.  If you do not have HIV, but are at risk, it may be recommended that you take a prescription medicine daily to prevent HIV infection. This is called pre-exposure prophylaxis (PrEP). You are considered at risk if:  You are sexually active and do not regularly use condoms or know the HIV status of your partner(s).  You take drugs by injection.  You are sexually active with a partner who has HIV. Talk with your health care provider about whether you are at high risk of being infected with HIV. If you choose to begin PrEP, you should first be tested for HIV. You should then be tested every 3 months for as long as you are taking PrEP. Pregnancy  If you are premenopausal and you may become pregnant, ask your health care provider about preconception counseling.  If you may become pregnant, take 400 to 800 micrograms (mcg) of folic acid every day.  If you want to prevent pregnancy, talk to your health care provider about birth control (contraception). Osteoporosis and menopause  Osteoporosis is a disease in which the bones lose minerals and strength with aging. This  can result in serious bone fractures. Your risk for osteoporosis can be identified using a bone density scan.  If you are 58 years of age or older, or if you are at risk for osteoporosis and fractures, ask your health care provider if you should be screened.  Ask your health care provider whether you should take a calcium or vitamin D supplement to lower your risk for osteoporosis.  Menopause may have certain physical symptoms and risks.  Hormone replacement therapy may reduce some of these symptoms and risks. Talk to your health care provider about whether hormone replacement therapy is right for you. Follow these instructions at home:  Schedule regular health, dental, and eye exams.  Stay current with your immunizations.  Do not use any tobacco products including cigarettes, chewing tobacco, or electronic cigarettes.  If you are pregnant, do not drink alcohol.  If you are breastfeeding, limit how much and how often you drink alcohol.  Limit alcohol intake to no more than 1 drink per day for nonpregnant women. One drink equals 12 ounces of beer, 5 ounces of wine, or 1 ounces of hard liquor.  Do not use street drugs.  Do not share needles.  Ask your health care provider for help if you need support or information about quitting drugs.  Tell your health care provider if you often feel depressed.  Tell your health care provider if you have ever been abused or do not feel safe at home. This information is not intended to replace advice given to you by your health care provider. Make sure you discuss any questions you have with your health care provider. Document Released: 11/18/2010 Document Revised: 10/11/2015 Document Reviewed: 02/06/2015 Elsevier Interactive Patient Education  2017 [employer].
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
## 2016-09-04 ENCOUNTER — Other Ambulatory Visit: Payer: Self-pay | Admitting: Cardiovascular Disease

## 2016-09-08 ENCOUNTER — Ambulatory Visit (INDEPENDENT_AMBULATORY_CARE_PROVIDER_SITE_OTHER): Payer: Medicare Other | Admitting: Family Medicine

## 2016-09-08 ENCOUNTER — Encounter: Payer: Self-pay | Admitting: Family Medicine

## 2016-09-08 VITALS — BP 139/81 | HR 60 | Temp 97.9°F | Resp 20 | Wt 145.0 lb

## 2016-09-08 DIAGNOSIS — B0230 Zoster ocular disease, unspecified: Secondary | ICD-10-CM | POA: Diagnosis not present

## 2016-09-08 MED ORDER — VALACYCLOVIR HCL 1 G PO TABS: 1000.0000 mg | ORAL_TABLET | Freq: Three times a day (TID) | ORAL | 0 refills | Status: DC

## 2016-09-08 MED ORDER — VALACYCLOVIR HCL 1 G PO TABS: 1000.0000 mg | ORAL_TABLET | Freq: Two times a day (BID) | ORAL | 0 refills | Status: DC

## 2016-09-08 MED ORDER — PREDNISONE 50 MG PO TABS: 50.0000 mg | ORAL_TABLET | Freq: Every day | ORAL | 0 refills | Status: DC
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
## 2016-09-08 NOTE — Patient Instructions (Signed)
Shingles Shingles, which is also known as herpes zoster, is an infection that causes a painful skin rash and fluid-filled blisters. Shingles is not related to genital herpes, which is a sexually transmitted infection. Shingles only develops in people who:  Have had chickenpox.  Have received the chickenpox vaccine. (This is rare.)  What are the causes? Shingles is caused by varicella-zoster virus (VZV). This is the same virus that causes chickenpox. After exposure to VZV, the virus stays in the body in an inactive (dormant) state. Shingles develops if the virus reactivates. This can happen many years after the initial exposure to VZV. It is not known what causes this virus to reactivate. What increases the risk? People who have had chickenpox or received the chickenpox vaccine are at risk for shingles. Infection is more common in people who:  Are older than age 50.  Have a weakened defense (immune) system, such as those with HIV, AIDS, or cancer.  Are taking medicines that weaken the immune system, such as transplant medicines.  Are under great stress.  What are the signs or symptoms? Early symptoms of this condition include itching, tingling, and pain in an area on your skin. Pain may be described as burning, stabbing, or throbbing. A few days or weeks after symptoms start, a painful red rash appears, usually on one side of the body in a bandlike or beltlike pattern. The rash eventually turns into fluid-filled blisters that break open, scab over, and dry up in about 2-3 weeks. At any time during the infection, you may also develop:  A fever.  Chills.  A headache.  An upset stomach.  How is this diagnosed? This condition is diagnosed with a skin exam. Sometimes, skin or fluid samples are taken from the blisters before a diagnosis is made. These samples are examined under a microscope or sent to a lab for testing. How is this treated? There is no specific cure for this condition.  Your health care provider will probably prescribe medicines to help you manage pain, recover more quickly, and avoid long-term problems. Medicines may include:  Antiviral drugs.  Anti-inflammatory drugs.  Pain medicines.  If the area involved is on your face, you may be referred to a specialist, such as an eye doctor (ophthalmologist) or an ear, nose, and throat (ENT) doctor to help you avoid eye problems, chronic pain, or disability. Follow these instructions at home: Medicines  Take medicines only as directed by your health care provider.  Apply an anti-itch or numbing cream to the affected area as directed by your health care provider. Blister and Rash Care  Take a cool bath or apply cool compresses to the area of the rash or blisters as directed by your health care provider. This may help with pain and itching.  Keep your rash covered with a loose bandage (dressing). Wear loose-fitting clothing to help ease the pain of material rubbing against the rash.  Keep your rash and blisters clean with mild soap and cool water or as directed by your health care provider.  Check your rash every day for signs of infection. These include redness, swelling, and pain that lasts or increases.  Do not pick your blisters.  Do not scratch your rash. General instructions  Rest as directed by your health care provider.  Keep all follow-up visits as directed by your health care provider. This is important.  Until your blisters scab over, your infection can cause chickenpox in people who have never had it or been vaccinated   against it. To prevent this from happening, avoid contact with other people, especially: ? Babies. ? Pregnant women. ? Children who have eczema. ? Elderly people who have transplants. ? People who have chronic illnesses, such as leukemia or AIDS. Contact a health care provider if:  Your pain is not relieved with prescribed medicines.  Your pain does not get better after  the rash heals.  Your rash looks infected. Signs of infection include redness, swelling, and pain that lasts or increases. Get help right away if:  The rash is on your face or nose.  You have facial pain, pain around your eye area, or loss of feeling on one side of your face.  You have ear pain or you have ringing in your ear.  You have loss of taste.  Your condition gets worse. This information is not intended to replace advice given to you by your health care provider. Make sure you discuss any questions you have with your health care provider. Document Released: 05/05/2005 Document Revised: 12/30/2015 Document Reviewed: 03/16/2014 Elsevier Interactive Patient Education  2017 [employer] Inc.  

## 2016-09-10 DIAGNOSIS — B0230 Zoster ocular disease, unspecified: Secondary | ICD-10-CM | POA: Diagnosis not present

## 2016-09-10 DIAGNOSIS — H35371 Puckering of macula, right eye: Secondary | ICD-10-CM | POA: Diagnosis not present

## 2016-10-07 ENCOUNTER — Other Ambulatory Visit: Payer: Self-pay | Admitting: *Deleted

## 2016-10-07 DIAGNOSIS — B0230 Zoster ocular disease, unspecified: Secondary | ICD-10-CM

## 2016-10-07 MED ORDER — VALACYCLOVIR HCL 1 G PO TABS: 1000.0000 mg | ORAL_TABLET | Freq: Three times a day (TID) | ORAL | 0 refills | Status: DC

## 2016-10-07 NOTE — Telephone Encounter (Signed)
Received request for refill on Valacyclovir. Last Rx'd on 09/08/16 .

## 2016-10-15 DIAGNOSIS — L820 Inflamed seborrheic keratosis: Secondary | ICD-10-CM | POA: Diagnosis not present

## 2016-10-15 DIAGNOSIS — D485 Neoplasm of uncertain behavior of skin: Secondary | ICD-10-CM | POA: Diagnosis not present

## 2016-10-15 DIAGNOSIS — L821 Other seborrheic keratosis: Secondary | ICD-10-CM | POA: Diagnosis not present

## 2016-10-29 ENCOUNTER — Ambulatory Visit: Payer: Medicare Other

## 2016-11-03 ENCOUNTER — Telehealth: Payer: Self-pay | Admitting: Family Medicine

## 2016-11-03 NOTE — Telephone Encounter (Signed)
Patient advised and voiced understanding.  

## 2016-11-03 NOTE — Telephone Encounter (Signed)
I submitted insurance verification to [employer] for patient's Prolia injection, per representative and prolia insurance verification form- patient will be responsible for about $220 / injection.   Tried to contact patient to make her aware.  Awaiting CB to discuss.

## 2016-11-05 ENCOUNTER — Ambulatory Visit (INDEPENDENT_AMBULATORY_CARE_PROVIDER_SITE_OTHER): Payer: Medicare Other | Admitting: Family Medicine

## 2016-11-05 DIAGNOSIS — M810 Age-related osteoporosis without current pathological fracture: Secondary | ICD-10-CM

## 2016-11-05 MED ORDER — DENOSUMAB 60 MG/ML ~~LOC~~ SOLN
60.0000 mg | Freq: Once | SUBCUTANEOUS | Status: AC
  Administered 2016-11-05: 60 mg via SUBCUTANEOUS

## 2016-11-07 NOTE — Progress Notes (Addendum)
Patient presents for prolia injection.   Injection given on left arm SQ, tolerated well.    Medical screening examination/treatment/procedure(s) were performed by non-physician practitioner and as supervising physician I was immediately available for consultation/collaboration.  I agree with above assessment and plan.  Electronically Signed by: Howard Pouch, DO [place] [facility] [place]

## 2016-11-11 DIAGNOSIS — Z96642 Presence of left artificial hip joint: Secondary | ICD-10-CM | POA: Diagnosis not present

## 2016-11-11 DIAGNOSIS — M25552 Pain in left hip: Secondary | ICD-10-CM | POA: Diagnosis not present

## 2016-12-02 DIAGNOSIS — H35371 Puckering of macula, right eye: Secondary | ICD-10-CM | POA: Diagnosis not present

## 2016-12-02 DIAGNOSIS — H35372 Puckering of macula, left eye: Secondary | ICD-10-CM | POA: Diagnosis not present

## 2016-12-02 DIAGNOSIS — H43812 Vitreous degeneration, left eye: Secondary | ICD-10-CM | POA: Diagnosis not present

## 2016-12-02 DIAGNOSIS — H43811 Vitreous degeneration, right eye: Secondary | ICD-10-CM | POA: Diagnosis not present

## 2016-12-15 DIAGNOSIS — M7062 Trochanteric bursitis, left hip: Secondary | ICD-10-CM | POA: Diagnosis not present

## 2016-12-29 ENCOUNTER — Telehealth: Payer: Self-pay | Admitting: Cardiovascular Disease

## 2016-12-29 NOTE — Telephone Encounter (Signed)
Agree with recommendations.  Agree with sodium restriction, leg elevation, and possible compression stockings.  If edema continues, she may need a change in her diuretic regimen and follow-up office visit.

## 2016-12-29 NOTE — Telephone Encounter (Signed)
Returned call to patient of Dr. Claiborne Billings regarding swelling She reports ankle swelling R>L - off and on for a few weeks but worse here recently She states she has swelling in her LE all day - not better in the AM after resting at night She has occasional shortness of breath that is "not an issue"  She states it "seems" like she has had weight gain - does not weigh herself  She goes to yoga twice weekly for exercise She sleeps with pillow under her legs at night b/c she has cramps in legs She does not routinely check her BP at home  Educated on avoiding salt/sodium in diet Advised to rest with legs elevated  Advised that she could purchase compression stockings and/or socks to wear to help with LE edema  Informed her would defer to MD for med changes if appropriate - only diuretic is in Maxzide Last visit 10 months ago

## 2016-12-29 NOTE — Telephone Encounter (Signed)
New message   Pt c/o swelling: STAT is pt has developed SOB within 24 hours  1. How long have you been experiencing swelling? Ongoing for weeks  2. Where is the swelling located? Right ankle  3.  Are you currently taking a "fluid pill"? no  4.  Are you currently SOB? no  5.  Have you traveled recently?no just to church, swelling worse after church

## 2016-12-30 NOTE — Telephone Encounter (Signed)
Spoke with pt, aware of dr kelly's recommendations. 

## 2017-01-12 ENCOUNTER — Ambulatory Visit (INDEPENDENT_AMBULATORY_CARE_PROVIDER_SITE_OTHER): Payer: Medicare Other | Admitting: Ophthalmology

## 2017-01-14 ENCOUNTER — Other Ambulatory Visit: Payer: Self-pay | Admitting: Internal Medicine

## 2017-02-23 DIAGNOSIS — Z23 Encounter for immunization: Secondary | ICD-10-CM | POA: Diagnosis not present

## 2017-02-26 DIAGNOSIS — M7062 Trochanteric bursitis, left hip: Secondary | ICD-10-CM | POA: Diagnosis not present

## 2017-03-04 ENCOUNTER — Ambulatory Visit (INDEPENDENT_AMBULATORY_CARE_PROVIDER_SITE_OTHER): Payer: Medicare Other | Admitting: Cardiovascular Disease

## 2017-03-04 ENCOUNTER — Encounter: Payer: Self-pay | Admitting: Cardiovascular Disease

## 2017-03-04 VITALS — BP 144/84 | HR 87 | Ht 61.0 in | Wt 148.4 lb

## 2017-03-04 DIAGNOSIS — I10 Essential (primary) hypertension: Secondary | ICD-10-CM

## 2017-03-04 DIAGNOSIS — I5181 Takotsubo syndrome: Secondary | ICD-10-CM

## 2017-03-04 DIAGNOSIS — F410 Panic disorder [episodic paroxysmal anxiety] without agoraphobia: Secondary | ICD-10-CM

## 2017-03-04 DIAGNOSIS — E785 Hyperlipidemia, unspecified: Secondary | ICD-10-CM

## 2017-03-04 MED ORDER — TRIAMTERENE-HCTZ 75-50 MG PO TABS: 1.0000 | ORAL_TABLET | ORAL | 1 refills | Status: DC

## 2017-03-04 MED ORDER — METOPROLOL SUCCINATE ER 25 MG PO TB24: 12.5000 mg | ORAL_TABLET | Freq: Every day | ORAL | 3 refills | Status: DC

## 2017-03-04 NOTE — Patient Instructions (Addendum)
Medication Instructions:  REDUCE triamterene-hctx (Maxzide) to every other day  START metoprolol succinate (Toprol XL) 12.5 mg (1/2 tablet) daily.  Follow-Up: Your physician wants you to follow-up in: 6 months with Dr. Claiborne Billings. You will receive a reminder letter in the mail two months in advance. If you don't receive a letter, please call our office to schedule the follow-up appointment.   Any Other Special Instructions Will Be Listed Below (If Applicable).     If you need a refill on your cardiac medications before your next appointment, please call your pharmacy.
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
## 2017-04-06 DIAGNOSIS — M545 Low back pain: Secondary | ICD-10-CM | POA: Diagnosis not present

## 2017-04-06 DIAGNOSIS — M25552 Pain in left hip: Secondary | ICD-10-CM | POA: Diagnosis not present

## 2017-04-14 DIAGNOSIS — M545 Low back pain: Secondary | ICD-10-CM | POA: Diagnosis not present

## 2017-04-20 DIAGNOSIS — M5442 Lumbago with sciatica, left side: Secondary | ICD-10-CM | POA: Diagnosis not present

## 2017-04-20 DIAGNOSIS — M545 Low back pain: Secondary | ICD-10-CM | POA: Diagnosis not present

## 2017-04-21 DIAGNOSIS — M5416 Radiculopathy, lumbar region: Secondary | ICD-10-CM | POA: Diagnosis not present

## 2017-05-05 DIAGNOSIS — M5416 Radiculopathy, lumbar region: Secondary | ICD-10-CM | POA: Diagnosis not present

## 2017-05-27 ENCOUNTER — Ambulatory Visit (INDEPENDENT_AMBULATORY_CARE_PROVIDER_SITE_OTHER): Payer: Medicare Other | Admitting: Family Medicine

## 2017-05-27 DIAGNOSIS — M810 Age-related osteoporosis without current pathological fracture: Secondary | ICD-10-CM

## 2017-05-27 MED ORDER — DENOSUMAB 60 MG/ML ~~LOC~~ SOLN
60.0000 mg | Freq: Once | SUBCUTANEOUS | Status: AC
  Administered 2017-05-27: 60 mg via SUBCUTANEOUS

## 2017-05-27 NOTE — Progress Notes (Addendum)
Patient presents to office today for her prolia injection.  Patient tolerated well.   Next injection due 7.9.19.   Medical screening examination/treatment/procedure(s) were performed by non-physician practitioner and as supervising physician I was immediately available for consultation/collaboration.  I agree with above assessment and plan.  Electronically Signed by: Howard Pouch, DO [facility]

## 2017-06-03 DIAGNOSIS — M5416 Radiculopathy, lumbar region: Secondary | ICD-10-CM | POA: Diagnosis not present

## 2017-06-17 DIAGNOSIS — M5416 Radiculopathy, lumbar region: Secondary | ICD-10-CM | POA: Diagnosis not present

## 2017-06-26 ENCOUNTER — Telehealth: Payer: Self-pay | Admitting: Family Medicine

## 2017-06-26 NOTE — Telephone Encounter (Unsigned)
Copied from [place]. Topic: General - Other >> Jun 26, 2017  5:54 PM Neva Seat wrote: Triamterene hctz 75-50 mg  Pt is out of Rx   [facility] [address], [place]

## 2017-06-29 ENCOUNTER — Other Ambulatory Visit: Payer: Self-pay

## 2017-06-29 MED ORDER — TRIAMTERENE-HCTZ 75-50 MG PO TABS: 1.0000 | ORAL_TABLET | ORAL | 1 refills | Status: DC

## 2017-07-01 ENCOUNTER — Other Ambulatory Visit: Payer: Self-pay | Admitting: *Deleted

## 2017-07-01 MED ORDER — TRIAMTERENE-HCTZ 75-50 MG PO TABS: 1.0000 | ORAL_TABLET | ORAL | 0 refills | Status: DC

## 2017-07-01 NOTE — Telephone Encounter (Signed)
Rx was originally sent in on 06/29/17 to [facility] by Triage. Called and cancelled that refill and resent to [facility] today. Tried to contact patient but unable to reach her.

## 2017-07-01 NOTE — Telephone Encounter (Signed)
PT called to check on status of refill

## 2017-07-10 DIAGNOSIS — M7062 Trochanteric bursitis, left hip: Secondary | ICD-10-CM | POA: Diagnosis not present

## 2017-07-10 DIAGNOSIS — M419 Scoliosis, unspecified: Secondary | ICD-10-CM | POA: Diagnosis not present

## 2017-07-10 DIAGNOSIS — M545 Low back pain: Secondary | ICD-10-CM | POA: Diagnosis not present

## 2017-07-10 DIAGNOSIS — M5136 Other intervertebral disc degeneration, lumbar region: Secondary | ICD-10-CM | POA: Diagnosis not present

## 2017-07-25 ENCOUNTER — Encounter: Payer: Self-pay | Admitting: Gastroenterology

## 2017-08-10 DIAGNOSIS — M25562 Pain in left knee: Secondary | ICD-10-CM | POA: Diagnosis not present

## 2017-08-17 ENCOUNTER — Telehealth: Payer: Self-pay | Admitting: Cardiovascular Disease

## 2017-08-17 MED ORDER — CANDESARTAN CILEXETIL 8 MG PO TABS: 8.0000 mg | ORAL_TABLET | Freq: Every day | ORAL | 3 refills | Status: DC

## 2017-08-17 NOTE — Telephone Encounter (Signed)
Patient aware and verbalized understanding.  rx sent to pharmacy.   Patient aware to monitor BP and call with any significant change.

## 2017-08-17 NOTE — Telephone Encounter (Signed)
New Message    Pt c/o medication issue:  1. Name of Medication: losartan (COZAAR) 100 MG tablet  2. How are you currently taking this medication (dosage and times per day) 1 tablet daily   3. Are you having a reaction (difficulty breathing--STAT)? none 4. What is your medication issue? Patient states that her prescription is on recall. She was advised to contact provider to have a different medication recommended and sent to the pharmacy. Please call to discuss.

## 2017-08-17 NOTE — Telephone Encounter (Signed)
Noted hx ofcardiomyopathy  Losartan, valsartan and irbesartan on recall.   Will recommend candesartan 8mg  daily (may need prior authorization).

## 2017-08-24 DIAGNOSIS — M25562 Pain in left knee: Secondary | ICD-10-CM | POA: Diagnosis not present

## 2017-08-28 ENCOUNTER — Ambulatory Visit: Payer: Medicare Other

## 2017-11-04 DIAGNOSIS — M25552 Pain in left hip: Secondary | ICD-10-CM | POA: Diagnosis not present

## 2017-11-05 ENCOUNTER — Other Ambulatory Visit (HOSPITAL_COMMUNITY): Payer: Self-pay | Admitting: Orthopedic Surgery

## 2017-11-05 DIAGNOSIS — Z96642 Presence of left artificial hip joint: Secondary | ICD-10-CM

## 2017-11-11 ENCOUNTER — Ambulatory Visit (INDEPENDENT_AMBULATORY_CARE_PROVIDER_SITE_OTHER): Payer: Medicare Other | Admitting: Cardiovascular Disease

## 2017-11-11 ENCOUNTER — Encounter: Payer: Self-pay | Admitting: Cardiovascular Disease

## 2017-11-11 VITALS — BP 127/86 | HR 75 | Ht 61.0 in | Wt 151.2 lb

## 2017-11-11 DIAGNOSIS — Z789 Other specified health status: Secondary | ICD-10-CM | POA: Diagnosis not present

## 2017-11-11 DIAGNOSIS — E785 Hyperlipidemia, unspecified: Secondary | ICD-10-CM | POA: Diagnosis not present

## 2017-11-11 DIAGNOSIS — I10 Essential (primary) hypertension: Secondary | ICD-10-CM | POA: Diagnosis not present

## 2017-11-11 DIAGNOSIS — F410 Panic disorder [episodic paroxysmal anxiety] without agoraphobia: Secondary | ICD-10-CM | POA: Diagnosis not present

## 2017-11-11 DIAGNOSIS — I5181 Takotsubo syndrome: Secondary | ICD-10-CM | POA: Diagnosis not present

## 2017-11-11 DIAGNOSIS — M25473 Effusion, unspecified ankle: Secondary | ICD-10-CM | POA: Diagnosis not present

## 2017-11-11 NOTE — Patient Instructions (Signed)
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
## 2017-11-16 ENCOUNTER — Encounter (HOSPITAL_COMMUNITY)
Admission: RE | Admit: 2017-11-16 | Discharge: 2017-11-16 | Disposition: A | Discharge status: Home / Self Care | Payer: Medicare Other | Source: Ambulatory Visit | Attending: Orthopedic Surgery | Admitting: Orthopedic Surgery

## 2017-11-16 DIAGNOSIS — Z471 Aftercare following joint replacement surgery: Secondary | ICD-10-CM | POA: Diagnosis not present

## 2017-11-16 DIAGNOSIS — Z96642 Presence of left artificial hip joint: Secondary | ICD-10-CM | POA: Diagnosis not present

## 2017-11-16 MED ORDER — TECHNETIUM TC 99M MEDRONATE IV KIT
20.0000 | PACK | Freq: Once | INTRAVENOUS | Status: AC | PRN
  Administered 2017-11-16: 20 via INTRAVENOUS

## 2017-11-25 DIAGNOSIS — M1612 Unilateral primary osteoarthritis, left hip: Secondary | ICD-10-CM | POA: Diagnosis not present

## 2017-11-25 DIAGNOSIS — M25552 Pain in left hip: Secondary | ICD-10-CM | POA: Diagnosis not present

## 2017-12-02 DIAGNOSIS — Z96642 Presence of left artificial hip joint: Secondary | ICD-10-CM | POA: Diagnosis not present

## 2017-12-02 DIAGNOSIS — M25552 Pain in left hip: Secondary | ICD-10-CM | POA: Diagnosis not present

## 2017-12-11 ENCOUNTER — Telehealth: Payer: Self-pay | Admitting: Cardiovascular Disease

## 2017-12-11 ENCOUNTER — Telehealth: Payer: Self-pay | Admitting: Family Medicine

## 2017-12-11 ENCOUNTER — Other Ambulatory Visit: Payer: Self-pay | Admitting: Family Medicine

## 2017-12-11 NOTE — Telephone Encounter (Signed)
°*  STAT* If patient is at the pharmacy, call can be transferred to refill team.   1. Which medications need to be refilled? (please list name of each medication and dose if known) TRIAMTERENE  75mg -HCTZ 50 mg  2. Which pharmacy/location (including street and city if local pharmacy) is medication to be sent to?[facility] [place]  3. Do they need a 30 day or 90 day supply? [place]

## 2017-12-11 NOTE — Telephone Encounter (Signed)
Received refill request for Maxzide for her BP.  - Last refill was approved by staff 07/01/2017 for 90 days. - pt  Has not been seen by this provider in over a year. If she would like this provider to continue managing this medication she is due for an appt.  Her other option is askig her cardiologist to supply this medication. They are managing her other BP scripts--> they saw her for this condition recently.

## 2017-12-11 NOTE — Telephone Encounter (Signed)
Spoke with patient explained to her we have not seen her in over a year so she would need to schedule an appt. With Dr Raoul Pitch in order to get a refill. Let patient know she could check with cardiology since she is seeing them for her BP. She states she will call them and see if they will refill it.

## 2017-12-14 MED ORDER — TRIAMTERENE-HCTZ 75-50 MG PO TABS: 1.0000 | ORAL_TABLET | ORAL | 3 refills | Status: DC

## 2017-12-23 DIAGNOSIS — M25552 Pain in left hip: Secondary | ICD-10-CM | POA: Diagnosis not present

## 2017-12-25 ENCOUNTER — Telehealth: Payer: Self-pay

## 2017-12-25 NOTE — Telephone Encounter (Signed)
    [facility] Pre-operative Risk Assessment    Request for surgical clearance:  1. What type of surgery is being performed? Left THA- Revision   2. When is this surgery scheduled? 02/11/18  3. What type of clearance is required (medical clearance vs. Pharmacy clearance to hold med vs. Both)? Both   4. Are there any medications that need to be held prior to surgery and how long? ASA  5. Practice name and name of physician performing surgery? [facility]  6. What is your office phone number [phone]    7.   What is your office fax number [phone]  8.   Anesthesia type (None, local, MAC, general) ? Unknown   Angela Preston 12/25/2017, 10:02 AM  _________________________________________________________________   (provider comments below)

## 2017-12-28 NOTE — Telephone Encounter (Signed)
Will route to Dr. Claiborne Billings for input about holding aspirin.   Will also need phone call closer to procedure time.  Dr. Claiborne Billings,  Please advise as to how long to hold aspirin if ok with you.   Please route your response back to CV DIV PREOP pool.  Burtis Junes, RN, [place] [address] [place], [place]  [phone]

## 2017-12-29 NOTE — Telephone Encounter (Signed)
   Primary Cardiologist: Dr Claiborne Billings  Chart reviewed and patient interviewed over the phone today as part of pre-operative protocol coverage. Given past medical history and time since last visit, based on ACC/AHA guidelines, Titania Gault Egolf would be at acceptable risk for the planned procedure without further cardiovascular testing.   In general we prefer patients stay on their aspirin if possible but its OK to hold this pre if the surgeon feels its necessary.  I will route this recommendation to the requesting party via Epic fax function and remove from pre-op pool.  Please call with questions.  Kerin Ransom, PA-C 12/29/2017, 3:22 PM
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
## 2018-01-12 ENCOUNTER — Ambulatory Visit (INDEPENDENT_AMBULATORY_CARE_PROVIDER_SITE_OTHER): Payer: Medicare Other

## 2018-01-12 ENCOUNTER — Encounter: Payer: Self-pay | Admitting: Family Medicine

## 2018-01-12 ENCOUNTER — Ambulatory Visit (INDEPENDENT_AMBULATORY_CARE_PROVIDER_SITE_OTHER): Payer: Medicare Other | Admitting: Family Medicine

## 2018-01-12 ENCOUNTER — Other Ambulatory Visit: Payer: Self-pay

## 2018-01-12 VITALS — BP 122/74 | HR 76 | Temp 98.2°F | Resp 16 | Ht 61.0 in | Wt 152.2 lb

## 2018-01-12 VITALS — BP 122/74 | HR 76 | Resp 16 | Ht 61.0 in | Wt 152.1 lb

## 2018-01-12 DIAGNOSIS — Z01818 Encounter for other preprocedural examination: Secondary | ICD-10-CM

## 2018-01-12 DIAGNOSIS — M810 Age-related osteoporosis without current pathological fracture: Secondary | ICD-10-CM

## 2018-01-12 DIAGNOSIS — Z0000 Encounter for general adult medical examination without abnormal findings: Secondary | ICD-10-CM | POA: Diagnosis not present

## 2018-01-12 DIAGNOSIS — Z1231 Encounter for screening mammogram for malignant neoplasm of breast: Secondary | ICD-10-CM | POA: Diagnosis not present

## 2018-01-12 DIAGNOSIS — R7309 Other abnormal glucose: Secondary | ICD-10-CM | POA: Diagnosis not present

## 2018-01-12 DIAGNOSIS — R8290 Unspecified abnormal findings in urine: Secondary | ICD-10-CM

## 2018-01-12 DIAGNOSIS — Z0389 Encounter for observation for other suspected diseases and conditions ruled out: Secondary | ICD-10-CM

## 2018-01-12 DIAGNOSIS — E785 Hyperlipidemia, unspecified: Secondary | ICD-10-CM

## 2018-01-12 DIAGNOSIS — Z1239 Encounter for other screening for malignant neoplasm of breast: Secondary | ICD-10-CM

## 2018-01-12 DIAGNOSIS — I10 Essential (primary) hypertension: Secondary | ICD-10-CM

## 2018-01-12 DIAGNOSIS — IMO0001 Reserved for inherently not codable concepts without codable children: Secondary | ICD-10-CM

## 2018-01-12 LAB — HEMOGLOBIN A1C: Hgb A1c MFr Bld: 5.9 % (ref 4.6–6.5)

## 2018-01-12 LAB — CBC WITH DIFFERENTIAL/PLATELET
BASOS ABS: 0.1 10*3/uL (ref 0.0–0.1)
BASOS PCT: 1.2 % (ref 0.0–3.0)
EOS ABS: 0.4 10*3/uL (ref 0.0–0.7)
Eosinophils Relative: 5.3 % — ABNORMAL HIGH (ref 0.0–5.0)
HEMATOCRIT: 41.4 % (ref 36.0–46.0)
HEMOGLOBIN: 13.9 g/dL (ref 12.0–15.0)
LYMPHS PCT: 31.8 % (ref 12.0–46.0)
Lymphs Abs: 2.7 10*3/uL (ref 0.7–4.0)
MCHC: 33.7 g/dL (ref 30.0–36.0)
MCV: 94.4 fl (ref 78.0–100.0)
MONOS PCT: 8.8 % (ref 3.0–12.0)
Monocytes Absolute: 0.7 10*3/uL (ref 0.1–1.0)
Neutro Abs: 4.4 10*3/uL (ref 1.4–7.7)
Neutrophils Relative %: 52.9 % (ref 43.0–77.0)
Platelets: 304 10*3/uL (ref 150.0–400.0)
RBC: 4.39 Mil/uL (ref 3.87–5.11)
RDW: 12.2 % (ref 11.5–15.5)
WBC: 8.4 10*3/uL (ref 4.0–10.5)

## 2018-01-12 LAB — LIPID PANEL
CHOLESTEROL: 216 mg/dL — AB (ref 0–200)
HDL: 46.6 mg/dL (ref >39.00)
NonHDL: 169.73
TRIGLYCERIDES: 316 mg/dL — AB (ref 0.0–149.0)
Total CHOL/HDL Ratio: 5
VLDL: 63.2 mg/dL — AB (ref 0.0–40.0)

## 2018-01-12 LAB — COMPREHENSIVE METABOLIC PANEL
ALT: 15 U/L (ref 0–35)
AST: 17 U/L (ref 0–37)
Albumin: 4.4 g/dL (ref 3.5–5.2)
Alkaline Phosphatase: 60 U/L (ref 39–117)
BILIRUBIN TOTAL: 0.7 mg/dL (ref 0.2–1.2)
BUN: 25 mg/dL — ABNORMAL HIGH (ref 6–23)
CALCIUM: 10.5 mg/dL (ref 8.4–10.5)
CO2: 30 meq/L (ref 19–32)
Chloride: 101 mEq/L (ref 96–112)
Creatinine, Ser: 1.06 mg/dL (ref 0.40–1.20)
GFR: 53.21 mL/min — AB (ref >60.00)
Glucose, Bld: 104 mg/dL — ABNORMAL HIGH (ref 70–99)
POTASSIUM: 4.2 meq/L (ref 3.5–5.1)
SODIUM: 140 meq/L (ref 135–145)
TOTAL PROTEIN: 7.3 g/dL (ref 6.0–8.3)

## 2018-01-12 LAB — LDL CHOLESTEROL, DIRECT: Direct LDL: 122 mg/dL

## 2018-01-12 LAB — VITAMIN D 25 HYDROXY (VIT D DEFICIENCY, FRACTURES): VITD: 20.9 ng/mL — ABNORMAL LOW (ref 30.00–100.00)

## 2018-01-12 LAB — TSH: TSH: 2.53 u[IU]/mL (ref 0.35–4.50)

## 2018-01-12 NOTE — Patient Instructions (Signed)
Ask your surgeon about stopping the Asprin prior to surgery, your cardiology office does not feel it is necessary, but ortho may feel it is.  Also ask your surgeon about the pain med (NSAIDS), they should be stopped at least 1 week prior to surgery. However some surgeons prefer stopping 2 weeks prior to surgery.    We will call you with lab results and further recommendations.  We will send form to surgeon once all labs received.    God luck, and it was great to see you today.     Please help [place] help you:  We are honored you have chosen [facility] for your Primary Care home. Below you will find basic instructions that you may need to access in the future. Please help [place] help you by reading the instructions, which cover many of the frequent questions we experience.   Prescription refills and request:  -In order to allow more efficient response time, please call your pharmacy for all refills. They will forward the request electronically to [place]. This allows for the quickest possible response. Request left on a nurse line can take longer to refill, since these are checked as time allows between office patients and other phone calls.  - refill request can take up to 3-5 working days to complete.  - If request is sent electronically and request is appropiate, it is usually completed in 1-2 business days.  - all patients will need to be seen routinely for all chronic medical conditions requiring prescription medications (see follow-up below). If you are overdue for follow up on your condition, you will be asked to make an appointment and we will call in enough medication to cover you until your appointment (up to 30 days).  - all controlled substances will require a face to face visit to request/refill.  - if you desire your prescriptions to go through a new pharmacy, and have an active script at original pharmacy, you will need to call your pharmacy and have scripts transferred to new pharmacy.  This is completed between the pharmacy locations and not by your provider.    Results: If any images or labs were ordered, it can take up to 1 week to get results depending on the test ordered and the lab/facility running and resulting the test. - Normal or stable results, which do not need further discussion, may be released to your mychart immediately with attached note to you. A call may not be generated for normal results. Please make certain to sign up for mychart. If you have questions on how to activate your mychart you can call the front office.  - If your results need further discussion, our office will attempt to contact you via phone, and if unable to reach you after 2 attempts, we will release your abnormal result to your mychart with instructions.  - All results will be automatically released in mychart after 1 week.  - Your provider will provide you with explanation and instruction on all relevant material in your results. Please keep in mind, results and labs may appear confusing or abnormal to the untrained eye, but it does not mean they are actually abnormal for you personally. If you have any questions about your results that are not covered, or you desire more detailed explanation than what was provided, you should make an appointment with your provider to do so.   Our office handles many outgoing and incoming calls daily. If we have not contacted you within 1  week about your results, please check your mychart to see if there is a message first and if not, then contact our office.  In helping with this matter, you help decrease call volume, and therefore allow [place] to be able to respond to patients needs more efficiently.   Acute office visits (sick visit):  An acute visit is intended for a new problem and are scheduled in shorter time slots to allow schedule openings for patients with new problems. This is the appropriate visit to discuss a new problem. Problems will not be addressed by  phone call or Echart message. Appointment is needed if requesting treatment. In order to provide you with excellent quality medical care with proper time for you to explain your problem, have an exam and receive treatment with instructions, these appointments should be limited to one new problem per visit. If you experience a new problem, in which you desire to be addressed, please make an acute office visit, we save openings on the schedule to accommodate you. Please do not save your new problem for any other type of visit, let us take care of it properly and quickly for you.   Follow up visits:  Depending on your condition(s) your provider will need to see you routinely in order to provide you with quality care and prescribe medication(s). Most chronic conditions (Example: hypertension, Diabetes, depression/anxiety... etc), require visits a couple times a year. Your provider will instruct you on proper follow up for your personal medical conditions and history. Please make certain to make follow up appointments for your condition as instructed. Failing to do so could result in lapse in your medication treatment/refills. If you request a refill, and are overdue to be seen on a condition, we will always provide you with a 30 day script (once) to allow you time to schedule.    Medicare wellness (well visit): - we have a wonderful Nurse Maudie Mercury), that will meet with you and provide you will yearly medicare wellness visits. These visits should occur yearly (can not be scheduled less than 1 calendar year apart) and cover preventive health, immunizations, advance directives and screenings you are entitled to yearly through your medicare benefits. Do not miss out on your entitled benefits, this is when medicare will pay for these benefits to be ordered for you.  These are strongly encouraged by your provider and is the appropriate type of visit to make certain you are up to date with all preventive health benefits. If  you have not had your medicare wellness exam in the last 12 months, please make certain to schedule one by calling the office and schedule your medicare wellness with Maudie Mercury as soon as possible.   Yearly physical (well visit):  - Adults are recommended to be seen yearly for physicals. Check with your insurance and date of your last physical, most insurances require one calendar year between physicals. Physicals include all preventive health topics, screenings, medical exam and labs that are appropriate for gender/age and history. You may have fasting labs needed at this visit. This is a well visit (not a sick visit), new problems should not be covered during this visit (see acute visit).  - Pediatric patients are seen more frequently when they are younger. Your provider will advise you on well child visit timing that is appropriate for your their age. - This is not a medicare wellness visit. Medicare wellness exams do not have an exam portion to the visit. Some medicare companies allow for a physical,  some do not allow a yearly physical. If your medicare allows a yearly physical you can schedule the medicare wellness with our nurse Maudie Mercury and have your physical with your provider after, on the same day. Please check with insurance for your full benefits.   Late Policy/No Shows:  - all new patients should arrive 15-30 minutes earlier than appointment to allow [place] time  to  obtain all personal demographics,  insurance information and for you to complete office paperwork. - All established patients should arrive 10-15 minutes earlier than appointment time to update all information and be checked in .  - In our best efforts to run on time, if you are late for your appointment you will be asked to either reschedule or if able, we will work you back into the schedule. There will be a wait time to work you back in the schedule,  depending on availability.  - If you are unable to make it to your appointment as  scheduled, please call 24 hours ahead of time to allow [place] to fill the time slot with someone else who needs to be seen. If you do not cancel your appointment ahead of time, you may be charged a no show fee.
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
## 2018-01-12 NOTE — Patient Instructions (Addendum)
Schedule mammogram.   Bring a copy of your living will and/or healthcare power of attorney to your next office visit.  Continue doing brain stimulating activities (puzzles, reading, adult coloring books, staying active) to keep memory sharp.   Health Maintenance, Female Adopting a healthy lifestyle and getting preventive care can go a long way to promote health and wellness. Talk with your health care provider about what schedule of regular examinations is right for you. This is a good chance for you to check in with your provider about disease prevention and staying healthy. In between checkups, there are plenty of things you can do on your own. Experts have done a lot of research about which lifestyle changes and preventive measures are most likely to keep you healthy. Ask your health care provider for more information. Weight and diet Eat a healthy diet  Be sure to include plenty of vegetables, fruits, low-fat dairy products, and lean protein.  Do not eat a lot of foods high in solid fats, added sugars, or salt.  Get regular exercise. This is one of the most important things you can do for your health. ? Most adults should exercise for at least 150 minutes each week. The exercise should increase your heart rate and make you sweat (moderate-intensity exercise). ? Most adults should also do strengthening exercises at least twice a week. This is in addition to the moderate-intensity exercise.  Maintain a healthy weight  Body mass index (BMI) is a measurement that can be used to identify possible weight problems. It estimates body fat based on height and weight. Your health care provider can help determine your BMI and help you achieve or maintain a healthy weight.  For females 20 years of age and older: ? A BMI below 18.5 is considered underweight. ? A BMI of 18.5 to 24.9 is normal. ? A BMI of 25 to 29.9 is considered overweight. ? A BMI of 30 and above is considered obese.  Watch  levels of cholesterol and blood lipids  You should start having your blood tested for lipids and cholesterol at 78 years of age, then have this test every 5 years.  You may need to have your cholesterol levels checked more often if: ? Your lipid or cholesterol levels are high. ? You are older than 78 years of age. ? You are at high risk for heart disease.  Cancer screening Lung Cancer  Lung cancer screening is recommended for adults 55-80 years old who are at high risk for lung cancer because of a history of smoking.  A yearly low-dose CT scan of the lungs is recommended for people who: ? Currently smoke. ? Have quit within the past 15 years. ? Have at least a 30-pack-year history of smoking. A pack year is smoking an average of one pack of cigarettes a day for 1 year.  Yearly screening should continue until it has been 15 years since you quit.  Yearly screening should stop if you develop a health problem that would prevent you from having lung cancer treatment.  Breast Cancer  Practice breast self-awareness. This means understanding how your breasts normally appear and feel.  It also means doing regular breast self-exams. Let your health care provider know about any changes, no matter how small.  If you are in your 20s or 30s, you should have a clinical breast exam (CBE) by a health care provider every 1-3 years as part of a regular health exam.  If you are 40 or older,   have a CBE every year. Also consider having a breast X-ray (mammogram) every year.  If you have a family history of breast cancer, talk to your health care provider about genetic screening.  If you are at high risk for breast cancer, talk to your health care provider about having an MRI and a mammogram every year.  Breast cancer gene (BRCA) assessment is recommended for women who have family members with BRCA-related cancers. BRCA-related cancers include: ? Breast. ? Ovarian. ? Tubal. ? Peritoneal  cancers.  Results of the assessment will determine the need for genetic counseling and BRCA1 and BRCA2 testing.  Cervical Cancer Your health care provider may recommend that you be screened regularly for cancer of the pelvic organs (ovaries, uterus, and vagina). This screening involves a pelvic examination, including checking for microscopic changes to the surface of your cervix (Pap test). You may be encouraged to have this screening done every 3 years, beginning at age 21.  For women ages 30-65, health care providers may recommend pelvic exams and Pap testing every 3 years, or they may recommend the Pap and pelvic exam, combined with testing for human papilloma virus (HPV), every 5 years. Some types of HPV increase your risk of cervical cancer. Testing for HPV may also be done on women of any age with unclear Pap test results.  Other health care providers may not recommend any screening for nonpregnant women who are considered low risk for pelvic cancer and who do not have symptoms. Ask your health care provider if a screening pelvic exam is right for you.  If you have had past treatment for cervical cancer or a condition that could lead to cancer, you need Pap tests and screening for cancer for at least 20 years after your treatment. If Pap tests have been discontinued, your risk factors (such as having a new sexual partner) need to be reassessed to determine if screening should resume. Some women have medical problems that increase the chance of getting cervical cancer. In these cases, your health care provider may recommend more frequent screening and Pap tests.  Colorectal Cancer  This type of cancer can be detected and often prevented.  Routine colorectal cancer screening usually begins at 78 years of age and continues through 78 years of age.  Your health care provider may recommend screening at an earlier age if you have risk factors for colon cancer.  Your health care provider may also  recommend using home test kits to check for hidden blood in the stool.  A small camera at the end of a tube can be used to examine your colon directly (sigmoidoscopy or colonoscopy). This is done to check for the earliest forms of colorectal cancer.  Routine screening usually begins at age 50.  Direct examination of the colon should be repeated every 5-10 years through 78 years of age. However, you may need to be screened more often if early forms of precancerous polyps or small growths are found.  Skin Cancer  Check your skin from head to toe regularly.  Tell your health care provider about any new moles or changes in moles, especially if there is a change in a mole's shape or color.  Also tell your health care provider if you have a mole that is larger than the size of a pencil eraser.  Always use sunscreen. Apply sunscreen liberally and repeatedly throughout the day.  Protect yourself by wearing long sleeves, pants, a wide-brimmed hat, and sunglasses whenever you are outside.    Heart disease, diabetes, and high blood pressure  High blood pressure causes heart disease and increases the risk of stroke. High blood pressure is more likely to develop in: ? People who have blood pressure in the high end of the normal range (130-139/85-89 mm Hg). ? People who are overweight or obese. ? People who are African American.  If you are 70-78 years of age, have your blood pressure checked every 3-5 years. If you are 24 years of age or older, have your blood pressure checked every year. You should have your blood pressure measured twice-once when you are at a hospital or clinic, and once when you are not at a hospital or clinic. Record the average of the two measurements. To check your blood pressure when you are not at a hospital or clinic, you can use: ? An automated blood pressure machine at a pharmacy. ? A home blood pressure monitor.  If you are between 77 years and 5 years old, ask your  health care provider if you should take aspirin to prevent strokes.  Have regular diabetes screenings. This involves taking a blood sample to check your fasting blood sugar level. ? If you are at a normal weight and have a low risk for diabetes, have this test once every three years after 78 years of age. ? If you are overweight and have a high risk for diabetes, consider being tested at a younger age or more often. Preventing infection Hepatitis B  If you have a higher risk for hepatitis B, you should be screened for this virus. You are considered at high risk for hepatitis B if: ? You were born in a country where hepatitis B is common. Ask your health care provider which countries are considered high risk. ? Your parents were born in a high-risk country, and you have not been immunized against hepatitis B (hepatitis B vaccine). ? You have HIV or AIDS. ? You use needles to inject street drugs. ? You live with someone who has hepatitis B. ? You have had sex with someone who has hepatitis B. ? You get hemodialysis treatment. ? You take certain medicines for conditions, including cancer, organ transplantation, and autoimmune conditions.  Hepatitis C  Blood testing is recommended for: ? Everyone born from [birth year] through 1966. ? Anyone with known risk factors for hepatitis C.  Sexually transmitted infections (STIs)  You should be screened for sexually transmitted infections (STIs) including gonorrhea and chlamydia if: ? You are sexually active and are younger than 78 years of age. ? You are older than 78 years of age and your health care provider tells you that you are at risk for this type of infection. ? Your sexual activity has changed since you were last screened and you are at an increased risk for chlamydia or gonorrhea. Ask your health care provider if you are at risk.  If you do not have HIV, but are at risk, it may be recommended that you take a prescription medicine daily to  prevent HIV infection. This is called pre-exposure prophylaxis (PrEP). You are considered at risk if: ? You are sexually active and do not regularly use condoms or know the HIV status of your partner(s). ? You take drugs by injection. ? You are sexually active with a partner who has HIV.  Talk with your health care provider about whether you are at high risk of being infected with HIV. If you choose to begin PrEP, you should first be tested for HIV.  You should then be tested every 3 months for as long as you are taking PrEP. Pregnancy  If you are premenopausal and you may become pregnant, ask your health care provider about preconception counseling.  If you may become pregnant, take 400 to 800 micrograms (mcg) of folic acid every day.  If you want to prevent pregnancy, talk to your health care provider about birth control (contraception). Osteoporosis and menopause  Osteoporosis is a disease in which the bones lose minerals and strength with aging. This can result in serious bone fractures. Your risk for osteoporosis can be identified using a bone density scan.  If you are 12 years of age or older, or if you are at risk for osteoporosis and fractures, ask your health care provider if you should be screened.  Ask your health care provider whether you should take a calcium or vitamin D supplement to lower your risk for osteoporosis.  Menopause may have certain physical symptoms and risks.  Hormone replacement therapy may reduce some of these symptoms and risks. Talk to your health care provider about whether hormone replacement therapy is right for you. Follow these instructions at home:  Schedule regular health, dental, and eye exams.  Stay current with your immunizations.  Do not use any tobacco products including cigarettes, chewing tobacco, or electronic cigarettes.  If you are pregnant, do not drink alcohol.  If you are breastfeeding, limit how much and how often you drink  alcohol.  Limit alcohol intake to no more than 1 drink per day for nonpregnant women. One drink equals 12 ounces of beer, 5 ounces of wine, or 1 ounces of hard liquor.  Do not use street drugs.  Do not share needles.  Ask your health care provider for help if you need support or information about quitting drugs.  Tell your health care provider if you often feel depressed.  Tell your health care provider if you have ever been abused or do not feel safe at home. This information is not intended to replace advice given to you by your health care provider. Make sure you discuss any questions you have with your health care provider. Document Released: 11/18/2010 Document Revised: 10/11/2015 Document Reviewed: 02/06/2015 Elsevier Interactive Patient Education  [employer].

## 2018-01-13 ENCOUNTER — Telehealth: Payer: Self-pay | Admitting: Family Medicine

## 2018-01-13 ENCOUNTER — Encounter: Payer: Self-pay | Admitting: Family Medicine

## 2018-01-13 DIAGNOSIS — N183 Chronic kidney disease, stage 3 unspecified: Secondary | ICD-10-CM

## 2018-01-13 DIAGNOSIS — N1832 Chronic kidney disease, stage 3b: Secondary | ICD-10-CM | POA: Insufficient documentation

## 2018-01-13 HISTORY — DX: Chronic kidney disease, stage 3 unspecified: N18.30

## 2018-01-13 LAB — URINALYSIS, ROUTINE W REFLEX MICROSCOPIC
BILIRUBIN URINE: NEGATIVE
Glucose, UA: NEGATIVE
Hgb urine dipstick: NEGATIVE
Hyaline Cast: NONE SEEN /LPF
KETONES UR: NEGATIVE
NITRITE: POSITIVE — AB
Protein, ur: NEGATIVE
RBC / HPF: NONE SEEN /HPF (ref 0–2)
SPECIFIC GRAVITY, URINE: 1.017 (ref 1.001–1.03)
Squamous Epithelial / LPF: NONE SEEN /HPF (ref <=5)
WBC, UA: >=60 /HPF — AB (ref 0–5)
pH: 5.5 (ref 5.0–8.0)

## 2018-01-13 MED ORDER — VITAMIN D (ERGOCALCIFEROL) 1.25 MG (50000 UNIT) PO CAPS: 50000.0000 [IU] | ORAL_CAPSULE | ORAL | 0 refills | Status: DC

## 2018-01-13 NOTE — Telephone Encounter (Signed)
Surgical clearance completed and placed on nurse desk.  Please attach OV note.

## 2018-01-13 NOTE — Addendum Note (Signed)
Addended by: Ralph Dowdy on: 01/13/2018 08:36 AM   Modules accepted: Orders

## 2018-01-13 NOTE — Telephone Encounter (Signed)
Surgical clearance form and OV note faxed to [facility]. Copy sent to scan.

## 2018-01-13 NOTE — Telephone Encounter (Signed)
Urine culture added to office visit 01/12/18 and sent to [place].

## 2018-01-13 NOTE — Telephone Encounter (Signed)
Please inform patient the following information: - Her vit d is low. Make sure is taking her Vit D daily. I have also added prescribed vit d once a week for 12 weeks.  - her cholesterol/triglycerides are elevated above goal, makes sure she is taking her Zetia and increase her fish oil to 3 capsules of what she is taking.  - Her first urine test had some potential signs of bacteria. Her urine is being sent for culture to see if she has an infection, this will take a few days to receive. Since she had no symptoms and it was being completed by pre-opertaive request of ortho, we will wait to start treatment, if any is necessary based on culture results.   I will complete her forms and fax back to ortho a soon as results received.

## 2018-01-13 NOTE — Telephone Encounter (Signed)
-----   Message from Ma Hillock, DO sent at 01/13/2018  8:35 AM EDT ----- Regarding: RE: urine culture Abnormal urine is fine.  Thanks. ----- Message ----- From: Ralph Dowdy, CMA Sent: 01/13/2018   7:59 AM EDT To: Ma Hillock, DO Subject: RE: urine culture                              Yes, but I need DX please.   ----- Message ----- From: Ma Hillock, DO Sent: 01/13/2018   7:37 AM EDT To: Ralph Dowdy, CMA Subject: urine culture                                  Can we send add a urine culture to her? Dr. Raliegh Ip

## 2018-01-13 NOTE — Telephone Encounter (Signed)
Spoke with patient reviewed lab results and instructions. Patient verbalized understanding. 

## 2018-01-15 ENCOUNTER — Telehealth: Payer: Self-pay | Admitting: Family Medicine

## 2018-01-15 LAB — URINE CULTURE
MICRO NUMBER:: 91030065
SPECIMEN QUALITY: ADEQUATE

## 2018-01-15 MED ORDER — CEPHALEXIN 500 MG PO CAPS: 500.0000 mg | ORAL_CAPSULE | Freq: Four times a day (QID) | ORAL | 0 refills | Status: DC

## 2018-01-15 NOTE — Telephone Encounter (Signed)
Spoke with patient reviewed lab results and instructions. Patient verbalized understanding. 

## 2018-01-15 NOTE — Telephone Encounter (Signed)
Please inform patient the following information: + e.coli UTI by culture. Prescribed keflex QID for her for 7 days

## 2018-01-15 NOTE — Telephone Encounter (Signed)
Please inform patient the following information: Her urine is positive for a significant e.coli infection. I have called in keflex for her to take every 6 hours for 7 days to treat
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
## 2018-02-02 NOTE — Progress Notes (Signed)
Cardiac clearance  Kerin Ransom PA-C on chart   Medical clearance Howard Pouch on chart   EKG 01-12-18 epic   hgba1c 01-12-18 epic   ECHO 08-27-15 epic

## 2018-02-02 NOTE — Patient Instructions (Signed)
Angela Preston  02/02/2018   Your procedure is scheduled on: 02-11-18   Report to [facility] Main  Entrance    Report to admitting at 8:00AM    Call this number if you have problems the morning of surgery [phone]     Remember: Do not eat food or drink liquids :After Midnight. BRUSH YOUR TEETH MORNING OF SURGERY AND RINSE YOUR MOUTH OUT, NO CHEWING GUM CANDY OR MINTS.     Take these medicines the morning of surgery with A SIP OF WATER: ezetimibe, tylenol if needed                                You may not have any metal on your body including hair pins and              piercings  Do not wear jewelry, make-up, lotions, powders or perfumes, deodorant             Do not wear nail polish.  Do not shave  48 hours prior to surgery.     Do not bring valuables to the hospital. [place].  Contacts, dentures or bridgework may not be worn into surgery.  Leave suitcase in the car. After surgery it may be brought to your room.                Please read over the following fact sheets you were given: _____________________________________________________________________             [facility] - Preparing for Surgery Before surgery, you can play an important role.  Because skin is not sterile, your skin needs to be as free of germs as possible.  You can reduce the number of germs on your skin by washing with CHG (chlorahexidine gluconate) soap before surgery.  CHG is an antiseptic cleaner which kills germs and bonds with the skin to continue killing germs even after washing. Please DO NOT use if you have an allergy to CHG or antibacterial soaps.  If your skin becomes reddened/irritated stop using the CHG and inform your nurse when you arrive at Short Stay. Do not shave (including legs and underarms) for at least 48 hours prior to the first CHG shower.  You may shave your face/neck. Please follow these  instructions carefully:  1.  Shower with CHG Soap the night before surgery and the  morning of Surgery.  2.  If you choose to wash your hair, wash your hair first as usual with your  normal  shampoo.  3.  After you shampoo, rinse your hair and body thoroughly to remove the  shampoo.                           4.  Use CHG as you would any other liquid soap.  You can apply chg directly  to the skin and wash                       Gently with a scrungie or clean washcloth.  5.  Apply the CHG Soap to your body ONLY FROM THE NECK DOWN.   Do not use on face/ open  Wound or open sores. Avoid contact with eyes, ears mouth and genitals (private parts).                       Wash face,  Genitals (private parts) with your normal soap.             6.  Wash thoroughly, paying special attention to the area where your surgery  will be performed.  7.  Thoroughly rinse your body with warm water from the neck down.  8.  DO NOT shower/wash with your normal soap after using and rinsing off  the CHG Soap.                9.  Pat yourself dry with a clean towel.            10.  Wear clean pajamas.            11.  Place clean sheets on your bed the night of your first shower and do not  sleep with pets. Day of Surgery : Do not apply any lotions/deodorants the morning of surgery.  Please wear clean clothes to the hospital/surgery center.  FAILURE TO FOLLOW THESE INSTRUCTIONS MAY RESULT IN THE CANCELLATION OF YOUR SURGERY PATIENT SIGNATURE_________________________________  NURSE SIGNATURE__________________________________  ________________________________________________________________________   Angela Preston  An incentive spirometer is a tool that can help keep your lungs clear and active. This tool measures how well you are filling your lungs with each breath. Taking long deep breaths may help reverse or decrease the chance of developing breathing (pulmonary) problems (especially  infection) following:  A long period of time when you are unable to move or be active. BEFORE THE PROCEDURE   If the spirometer includes an indicator to show your best effort, your nurse or respiratory therapist will set it to a desired goal.  If possible, sit up straight or lean slightly forward. Try not to slouch.  Hold the incentive spirometer in an upright position. INSTRUCTIONS FOR USE  1. Sit on the edge of your bed if possible, or sit up as far as you can in bed or on a chair. 2. Hold the incentive spirometer in an upright position. 3. Breathe out normally. 4. Place the mouthpiece in your mouth and seal your lips tightly around it. 5. Breathe in slowly and as deeply as possible, raising the piston or the ball toward the top of the column. 6. Hold your breath for 3-5 seconds or for as long as possible. Allow the piston or ball to fall to the bottom of the column. 7. Remove the mouthpiece from your mouth and breathe out normally. 8. Rest for a few seconds and repeat Steps 1 through 7 at least 10 times every 1-2 hours when you are awake. Take your time and take a few normal breaths between deep breaths. 9. The spirometer may include an indicator to show your best effort. Use the indicator as a goal to work toward during each repetition. 10. After each set of 10 deep breaths, practice coughing to be sure your lungs are clear. If you have an incision (the cut made at the time of surgery), support your incision when coughing by placing a pillow or rolled up towels firmly against it. Once you are able to get out of bed, walk around indoors and cough well. You may stop using the incentive spirometer when instructed by your caregiver.  RISKS AND COMPLICATIONS  Take your time so you do not get  dizzy or light-headed.  If you are in pain, you may need to take or ask for pain medication before doing incentive spirometry. It is harder to take a deep breath if you are having pain. AFTER  USE  Rest and breathe slowly and easily.  It can be helpful to keep track of a log of your progress. Your caregiver can provide you with a simple table to help with this. If you are using the spirometer at home, follow these instructions: [place] IF:   You are having difficultly using the spirometer.  You have trouble using the spirometer as often as instructed.  Your pain medication is not giving enough relief while using the spirometer.  You develop fever of 100.5 F (38.1 C) or higher. SEEK IMMEDIATE MEDICAL CARE IF:   You cough up bloody sputum that had not been present before.  You develop fever of 102 F (38.9 C) or greater.  You develop worsening pain at or near the incision site. MAKE SURE YOU:   Understand these instructions.  Will watch your condition.  Will get help right away if you are not doing well or get worse. Document Released: 09/15/2006 Document Revised: 07/28/2011 Document Reviewed: 11/16/2006 ExitCare Patient Information 2014 ExitCare, [place].   ________________________________________________________________________  WHAT IS A BLOOD TRANSFUSION? Blood Transfusion Information  A transfusion is the replacement of blood or some of its parts. Blood is made up of multiple cells which provide different functions.  Red blood cells carry oxygen and are used for blood loss replacement.  White blood cells fight against infection.  Platelets control bleeding.  Plasma helps clot blood.  Other blood products are available for specialized needs, such as hemophilia or other clotting disorders. BEFORE THE TRANSFUSION  Who gives blood for transfusions?   Healthy volunteers who are fully evaluated to make sure their blood is safe. This is blood bank blood. Transfusion therapy is the safest it has ever been in the practice of medicine. Before blood is taken from a donor, a complete history is taken to make sure that person has no history of diseases  nor engages in risky social behavior (examples are intravenous drug use or sexual activity with multiple partners). The donor's travel history is screened to minimize risk of transmitting infections, such as malaria. The donated blood is tested for signs of infectious diseases, such as HIV and hepatitis. The blood is then tested to be sure it is compatible with you in order to minimize the chance of a transfusion reaction. If you or a relative donates blood, this is often done in anticipation of surgery and is not appropriate for emergency situations. It takes many days to process the donated blood. RISKS AND COMPLICATIONS Although transfusion therapy is very safe and saves many lives, the main dangers of transfusion include:   Getting an infectious disease.  Developing a transfusion reaction. This is an allergic reaction to something in the blood you were given. Every precaution is taken to prevent this. The decision to have a blood transfusion has been considered carefully by your caregiver before blood is given. Blood is not given unless the benefits outweigh the risks. AFTER THE TRANSFUSION  Right after receiving a blood transfusion, you will usually feel much better and more energetic. This is especially true if your red blood cells have gotten low (anemic). The transfusion raises the level of the red blood cells which carry oxygen, and this usually causes an energy increase.  The nurse administering the transfusion will  monitor you carefully for complications. HOME CARE INSTRUCTIONS  No special instructions are needed after a transfusion. You may find your energy is better. Speak with your caregiver about any limitations on activity for underlying diseases you may have. SEEK MEDICAL CARE IF:   Your condition is not improving after your transfusion.  You develop redness or irritation at the intravenous (IV) site. SEEK IMMEDIATE MEDICAL CARE IF:  Any of the following symptoms occur over the  next 12 hours:  Shaking chills.  You have a temperature by mouth above 102 F (38.9 C), not controlled by medicine.  Chest, back, or muscle pain.  People around you feel you are not acting correctly or are confused.  Shortness of breath or difficulty breathing.  Dizziness and fainting.  You get a rash or develop hives.  You have a decrease in urine output.  Your urine turns a dark color or changes to pink, red, or brown. Any of the following symptoms occur over the next 10 days:  You have a temperature by mouth above 102 F (38.9 C), not controlled by medicine.  Shortness of breath.  Weakness after normal activity.  The white part of the eye turns yellow (jaundice).  You have a decrease in the amount of urine or are urinating less often.  Your urine turns a dark color or changes to pink, red, or brown. Document Released: 05/02/2000 Document Revised: 07/28/2011 Document Reviewed: 12/20/2007 [facility] Patient Information 2014 [place], [place].  _______________________________________________________________________

## 2018-02-05 ENCOUNTER — Encounter (HOSPITAL_COMMUNITY): Payer: Self-pay

## 2018-02-05 ENCOUNTER — Encounter (HOSPITAL_COMMUNITY)
Admission: RE | Admit: 2018-02-05 | Discharge: 2018-02-05 | Disposition: A | Discharge status: Home / Self Care | Payer: Medicare Other | Source: Ambulatory Visit | Attending: Orthopedic Surgery | Admitting: Orthopedic Surgery

## 2018-02-05 ENCOUNTER — Other Ambulatory Visit: Payer: Self-pay

## 2018-02-05 DIAGNOSIS — Z01812 Encounter for preprocedural laboratory examination: Secondary | ICD-10-CM | POA: Diagnosis not present

## 2018-02-05 HISTORY — DX: Unspecified osteoarthritis, unspecified site: M19.90

## 2018-02-05 HISTORY — DX: Effusion, unspecified ankle: M25.473

## 2018-02-05 LAB — BASIC METABOLIC PANEL
Anion gap: 12 (ref 5–15)
BUN: 24 mg/dL — AB (ref 8–23)
CALCIUM: 11 mg/dL — AB (ref 8.9–10.3)
CO2: 28 mmol/L (ref 22–32)
Chloride: 105 mmol/L (ref 98–111)
Creatinine, Ser: 0.88 mg/dL (ref 0.44–1.00)
GFR calc Af Amer: >60 mL/min (ref >60)
GFR calc non Af Amer: >60 mL/min (ref >60)
GLUCOSE: 115 mg/dL — AB (ref 70–99)
Potassium: 5 mmol/L (ref 3.5–5.1)
Sodium: 145 mmol/L (ref 135–145)

## 2018-02-05 LAB — CBC
HCT: 42.7 % (ref 36.0–46.0)
Hemoglobin: 14.2 g/dL (ref 12.0–15.0)
MCH: 31.8 pg (ref 26.0–34.0)
MCHC: 33.3 g/dL (ref 30.0–36.0)
MCV: 95.7 fL (ref 78.0–100.0)
PLATELETS: 353 10*3/uL (ref 150–400)
RBC: 4.46 MIL/uL (ref 3.87–5.11)
RDW: 12.6 % (ref 11.5–15.5)
WBC: 9 10*3/uL (ref 4.0–10.5)

## 2018-02-05 LAB — SURGICAL PCR SCREEN
MRSA, PCR: NEGATIVE
STAPHYLOCOCCUS AUREUS: NEGATIVE

## 2018-02-11 ENCOUNTER — Inpatient Hospital Stay (HOSPITAL_COMMUNITY)
Admission: RE | Admit: 2018-02-11 | Discharge: 2018-02-13 | DRG: 468 | Disposition: A | Discharge status: Home / Self Care | Payer: Medicare Other | Source: Other Acute Inpatient Hospital | Attending: Orthopedic Surgery | Admitting: Orthopedic Surgery

## 2018-02-11 ENCOUNTER — Encounter (HOSPITAL_COMMUNITY): Payer: Self-pay

## 2018-02-11 ENCOUNTER — Inpatient Hospital Stay (HOSPITAL_COMMUNITY): Payer: Medicare Other

## 2018-02-11 ENCOUNTER — Inpatient Hospital Stay (HOSPITAL_COMMUNITY): Payer: Medicare Other | Admitting: Anesthesiology

## 2018-02-11 ENCOUNTER — Other Ambulatory Visit: Payer: Self-pay

## 2018-02-11 ENCOUNTER — Encounter (HOSPITAL_COMMUNITY)
Admission: RE | Disposition: A | Discharge status: Home / Self Care | Payer: Self-pay | Source: Other Acute Inpatient Hospital | Attending: Orthopedic Surgery

## 2018-02-11 DIAGNOSIS — T84195A Other mechanical complication of internal fixation device of left femur, initial encounter: Secondary | ICD-10-CM | POA: Diagnosis not present

## 2018-02-11 DIAGNOSIS — N183 Chronic kidney disease, stage 3 (moderate): Secondary | ICD-10-CM | POA: Diagnosis not present

## 2018-02-11 DIAGNOSIS — T8484XA Pain due to internal orthopedic prosthetic devices, implants and grafts, initial encounter: Secondary | ICD-10-CM | POA: Diagnosis present

## 2018-02-11 DIAGNOSIS — Z888 Allergy status to other drugs, medicaments and biological substances status: Secondary | ICD-10-CM | POA: Diagnosis not present

## 2018-02-11 DIAGNOSIS — M21752 Unequal limb length (acquired), left femur: Secondary | ICD-10-CM | POA: Diagnosis present

## 2018-02-11 DIAGNOSIS — M810 Age-related osteoporosis without current pathological fracture: Secondary | ICD-10-CM | POA: Diagnosis present

## 2018-02-11 DIAGNOSIS — Z6828 Body mass index (BMI) 28.0-28.9, adult: Secondary | ICD-10-CM

## 2018-02-11 DIAGNOSIS — Z9181 History of falling: Secondary | ICD-10-CM

## 2018-02-11 DIAGNOSIS — T84091A Other mechanical complication of internal left hip prosthesis, initial encounter: Principal | ICD-10-CM | POA: Diagnosis present

## 2018-02-11 DIAGNOSIS — E663 Overweight: Secondary | ICD-10-CM | POA: Diagnosis present

## 2018-02-11 DIAGNOSIS — Z96649 Presence of unspecified artificial hip joint: Secondary | ICD-10-CM

## 2018-02-11 DIAGNOSIS — Y792 Prosthetic and other implants, materials and accessory orthopedic devices associated with adverse incidents: Secondary | ICD-10-CM | POA: Diagnosis present

## 2018-02-11 DIAGNOSIS — Z7982 Long term (current) use of aspirin: Secondary | ICD-10-CM

## 2018-02-11 DIAGNOSIS — Z471 Aftercare following joint replacement surgery: Secondary | ICD-10-CM | POA: Diagnosis not present

## 2018-02-11 DIAGNOSIS — E785 Hyperlipidemia, unspecified: Secondary | ICD-10-CM | POA: Diagnosis present

## 2018-02-11 DIAGNOSIS — I129 Hypertensive chronic kidney disease with stage 1 through stage 4 chronic kidney disease, or unspecified chronic kidney disease: Secondary | ICD-10-CM | POA: Diagnosis present

## 2018-02-11 DIAGNOSIS — T8489XA Other specified complication of internal orthopedic prosthetic devices, implants and grafts, initial encounter: Secondary | ICD-10-CM | POA: Diagnosis not present

## 2018-02-11 DIAGNOSIS — Z79899 Other long term (current) drug therapy: Secondary | ICD-10-CM

## 2018-02-11 DIAGNOSIS — Z96642 Presence of left artificial hip joint: Secondary | ICD-10-CM | POA: Diagnosis not present

## 2018-02-11 DIAGNOSIS — I252 Old myocardial infarction: Secondary | ICD-10-CM

## 2018-02-11 DIAGNOSIS — M25559 Pain in unspecified hip: Secondary | ICD-10-CM

## 2018-02-11 HISTORY — PX: TOTAL HIP REVISION: SHX763

## 2018-02-11 HISTORY — DX: Presence of unspecified artificial hip joint: Z96.649

## 2018-02-11 LAB — TYPE AND SCREEN
ABO/RH(D): A POS
Antibody Screen: NEGATIVE

## 2018-02-11 SURGERY — TOTAL HIP REVISION
Anesthesia: Spinal | Site: Hip | Laterality: Left

## 2018-02-11 MED ORDER — PROPOFOL 500 MG/50ML IV EMUL
INTRAVENOUS | Status: DC | PRN
  Administered 2018-02-11: 75 ug/kg/min via INTRAVENOUS

## 2018-02-11 MED ORDER — PHENOL 1.4 % MT LIQD: 1.0000 | OROMUCOSAL | Status: DC | PRN

## 2018-02-11 MED ORDER — ALUM & MAG HYDROXIDE-SIMETH 200-200-20 MG/5ML PO SUSP
15.0000 mL | ORAL | Status: DC | PRN
  Administered 2018-02-13: 15 mL via ORAL
  Filled 2018-02-11: qty 30

## 2018-02-11 MED ORDER — HYDROCODONE-ACETAMINOPHEN 7.5-325 MG PO TABS
1.0000 | ORAL_TABLET | ORAL | Status: DC | PRN
  Administered 2018-02-12 (×3): 1 via ORAL
  Filled 2018-02-11 (×3): qty 1

## 2018-02-11 MED ORDER — ONDANSETRON HCL 4 MG/2ML IJ SOLN: 4.0000 mg | Freq: Four times a day (QID) | INTRAMUSCULAR | Status: DC | PRN

## 2018-02-11 MED ORDER — PHENYLEPHRINE 40 MCG/ML (10ML) SYRINGE FOR IV PUSH (FOR BLOOD PRESSURE SUPPORT)
PREFILLED_SYRINGE | INTRAVENOUS | Status: DC | PRN
  Administered 2018-02-11: 80 ug via INTRAVENOUS

## 2018-02-11 MED ORDER — PROPOFOL 500 MG/50ML IV EMUL
INTRAVENOUS | Status: DC | PRN
  Administered 2018-02-11: 30 mg via INTRAVENOUS

## 2018-02-11 MED ORDER — EZETIMIBE 10 MG PO TABS
10.0000 mg | ORAL_TABLET | Freq: Every day | ORAL | Status: DC
  Administered 2018-02-11 – 2018-02-13 (×3): 10 mg via ORAL
  Filled 2018-02-11 (×3): qty 1

## 2018-02-11 MED ORDER — MAGNESIUM CITRATE PO SOLN: 1.0000 | Freq: Once | ORAL | Status: DC | PRN

## 2018-02-11 MED ORDER — HYDROMORPHONE HCL 1 MG/ML IJ SOLN
0.2500 mg | INTRAMUSCULAR | Status: DC | PRN
  Administered 2018-02-11 (×4): 0.5 mg via INTRAVENOUS

## 2018-02-11 MED ORDER — FERROUS SULFATE 325 (65 FE) MG PO TABS
325.0000 mg | ORAL_TABLET | Freq: Three times a day (TID) | ORAL | Status: DC
  Administered 2018-02-12 – 2018-02-13 (×3): 325 mg via ORAL
  Filled 2018-02-11 (×2): qty 1

## 2018-02-11 MED ORDER — DIPHENHYDRAMINE HCL 12.5 MG/5ML PO ELIX: 12.5000 mg | ORAL_SOLUTION | ORAL | Status: DC | PRN

## 2018-02-11 MED ORDER — TRANEXAMIC ACID 1000 MG/10ML IV SOLN
1000.0000 mg | INTRAVENOUS | Status: AC
  Administered 2018-02-11: 1000 mg via INTRAVENOUS
  Filled 2018-02-11: qty 10

## 2018-02-11 MED ORDER — DEXAMETHASONE SODIUM PHOSPHATE 10 MG/ML IJ SOLN
10.0000 mg | Freq: Once | INTRAMUSCULAR | Status: AC
  Administered 2018-02-12: 10 mg via INTRAVENOUS
  Filled 2018-02-11: qty 1

## 2018-02-11 MED ORDER — METOCLOPRAMIDE HCL 5 MG/ML IJ SOLN
5.0000 mg | Freq: Three times a day (TID) | INTRAMUSCULAR | Status: DC | PRN
  Administered 2018-02-13: 10 mg via INTRAVENOUS
  Filled 2018-02-11: qty 2

## 2018-02-11 MED ORDER — HYDROCODONE-ACETAMINOPHEN 5-325 MG PO TABS
1.0000 | ORAL_TABLET | ORAL | Status: DC | PRN
  Administered 2018-02-11: 2 via ORAL
  Administered 2018-02-11 (×2): 1 via ORAL
  Administered 2018-02-12 – 2018-02-13 (×3): 2 via ORAL
  Filled 2018-02-11 (×4): qty 2
  Filled 2018-02-11 (×2): qty 1
  Filled 2018-02-11: qty 2

## 2018-02-11 MED ORDER — ONDANSETRON HCL 4 MG PO TABS
4.0000 mg | ORAL_TABLET | Freq: Four times a day (QID) | ORAL | Status: DC | PRN
  Administered 2018-02-13: 4 mg via ORAL
  Filled 2018-02-11: qty 1

## 2018-02-11 MED ORDER — METOCLOPRAMIDE HCL 5 MG PO TABS: 5.0000 mg | ORAL_TABLET | Freq: Three times a day (TID) | ORAL | Status: DC | PRN

## 2018-02-11 MED ORDER — CELECOXIB 200 MG PO CAPS
200.0000 mg | ORAL_CAPSULE | Freq: Two times a day (BID) | ORAL | Status: DC
  Administered 2018-02-11 – 2018-02-13 (×4): 200 mg via ORAL
  Filled 2018-02-11 (×4): qty 1

## 2018-02-11 MED ORDER — EPHEDRINE SULFATE 50 MG/ML IJ SOLN
INTRAMUSCULAR | Status: DC | PRN
  Administered 2018-02-11 (×3): 10 mg via INTRAVENOUS

## 2018-02-11 MED ORDER — 0.9 % SODIUM CHLORIDE (POUR BTL) OPTIME
TOPICAL | Status: DC | PRN
  Administered 2018-02-11: 1000 mL

## 2018-02-11 MED ORDER — TRIAMTERENE-HCTZ 75-50 MG PO TABS
1.0000 | ORAL_TABLET | Freq: Every day | ORAL | Status: DC
  Administered 2018-02-11 – 2018-02-13 (×2): 1 via ORAL
  Filled 2018-02-11 (×3): qty 1

## 2018-02-11 MED ORDER — PROPOFOL 10 MG/ML IV BOLUS
INTRAVENOUS | Status: AC
  Filled 2018-02-11: qty 60

## 2018-02-11 MED ORDER — ACETAMINOPHEN 325 MG PO TABS: 325.0000 mg | ORAL_TABLET | Freq: Four times a day (QID) | ORAL | Status: DC | PRN

## 2018-02-11 MED ORDER — FENTANYL CITRATE (PF) 100 MCG/2ML IJ SOLN
INTRAMUSCULAR | Status: DC | PRN
  Administered 2018-02-11: 100 ug via INTRAVENOUS

## 2018-02-11 MED ORDER — FENTANYL CITRATE (PF) 100 MCG/2ML IJ SOLN
INTRAMUSCULAR | Status: AC
  Filled 2018-02-11: qty 2

## 2018-02-11 MED ORDER — CHLORHEXIDINE GLUCONATE 4 % EX LIQD: 60.0000 mL | Freq: Once | CUTANEOUS | Status: DC

## 2018-02-11 MED ORDER — MORPHINE SULFATE (PF) 2 MG/ML IV SOLN
0.5000 mg | INTRAVENOUS | Status: DC | PRN
  Administered 2018-02-11: 1 mg via INTRAVENOUS
  Filled 2018-02-11: qty 1

## 2018-02-11 MED ORDER — POLYETHYLENE GLYCOL 3350 17 G PO PACK
17.0000 g | PACK | Freq: Two times a day (BID) | ORAL | Status: DC
  Administered 2018-02-11 – 2018-02-12 (×2): 17 g via ORAL
  Filled 2018-02-11 (×4): qty 1

## 2018-02-11 MED ORDER — BISACODYL 10 MG RE SUPP: 10.0000 mg | Freq: Every day | RECTAL | Status: DC | PRN

## 2018-02-11 MED ORDER — CEFAZOLIN SODIUM-DEXTROSE 2-4 GM/100ML-% IV SOLN
2.0000 g | INTRAVENOUS | Status: AC
  Administered 2018-02-11: 2 g via INTRAVENOUS
  Filled 2018-02-11: qty 100

## 2018-02-11 MED ORDER — SODIUM CHLORIDE 0.9 % IR SOLN
Status: DC | PRN
  Administered 2018-02-11: 1000 mL

## 2018-02-11 MED ORDER — TRANEXAMIC ACID 1000 MG/10ML IV SOLN
1000.0000 mg | Freq: Once | INTRAVENOUS | Status: AC
  Administered 2018-02-11: 1000 mg via INTRAVENOUS
  Filled 2018-02-11: qty 1000

## 2018-02-11 MED ORDER — PROMETHAZINE HCL 25 MG/ML IJ SOLN: 6.2500 mg | INTRAMUSCULAR | Status: DC | PRN

## 2018-02-11 MED ORDER — METHOCARBAMOL 500 MG PO TABS
500.0000 mg | ORAL_TABLET | Freq: Four times a day (QID) | ORAL | Status: DC | PRN
  Administered 2018-02-11: 500 mg via ORAL
  Filled 2018-02-11 (×2): qty 1

## 2018-02-11 MED ORDER — STERILE WATER FOR IRRIGATION IR SOLN
Status: DC | PRN
  Administered 2018-02-11: 2000 mL

## 2018-02-11 MED ORDER — PROPOFOL 10 MG/ML IV BOLUS
INTRAVENOUS | Status: AC
  Filled 2018-02-11: qty 20

## 2018-02-11 MED ORDER — LACTATED RINGERS IV SOLN
INTRAVENOUS | Status: DC
  Administered 2018-02-11 (×2): via INTRAVENOUS

## 2018-02-11 MED ORDER — METHOCARBAMOL 500 MG IVPB - SIMPLE MED
500.0000 mg | Freq: Four times a day (QID) | INTRAVENOUS | Status: DC | PRN
  Filled 2018-02-11: qty 50

## 2018-02-11 MED ORDER — BUPIVACAINE HCL (PF) 0.75 % IJ SOLN
INTRAMUSCULAR | Status: DC | PRN
  Administered 2018-02-11: 2 mL via INTRATHECAL

## 2018-02-11 MED ORDER — DEXAMETHASONE SODIUM PHOSPHATE 10 MG/ML IJ SOLN
10.0000 mg | Freq: Once | INTRAMUSCULAR | Status: AC
  Administered 2018-02-11: 10 mg via INTRAVENOUS

## 2018-02-11 MED ORDER — SODIUM CHLORIDE 0.9 % IV SOLN
INTRAVENOUS | Status: DC
  Administered 2018-02-11 – 2018-02-12 (×2): via INTRAVENOUS

## 2018-02-11 MED ORDER — ASPIRIN 81 MG PO CHEW
81.0000 mg | CHEWABLE_TABLET | Freq: Two times a day (BID) | ORAL | Status: DC
  Administered 2018-02-11 – 2018-02-13 (×4): 81 mg via ORAL
  Filled 2018-02-11 (×4): qty 1

## 2018-02-11 MED ORDER — DOCUSATE SODIUM 100 MG PO CAPS
100.0000 mg | ORAL_CAPSULE | Freq: Two times a day (BID) | ORAL | Status: DC
  Administered 2018-02-11 – 2018-02-13 (×3): 100 mg via ORAL
  Filled 2018-02-11 (×4): qty 1

## 2018-02-11 MED ORDER — HYDROMORPHONE HCL 1 MG/ML IJ SOLN
INTRAMUSCULAR | Status: AC
  Filled 2018-02-11: qty 2

## 2018-02-11 MED ORDER — MENTHOL 3 MG MT LOZG: 1.0000 | LOZENGE | OROMUCOSAL | Status: DC | PRN

## 2018-02-11 MED ORDER — PHENYLEPHRINE 40 MCG/ML (10ML) SYRINGE FOR IV PUSH (FOR BLOOD PRESSURE SUPPORT)
PREFILLED_SYRINGE | INTRAVENOUS | Status: AC
  Filled 2018-02-11: qty 10

## 2018-02-11 MED ORDER — CEFAZOLIN SODIUM-DEXTROSE 2-4 GM/100ML-% IV SOLN
2.0000 g | Freq: Four times a day (QID) | INTRAVENOUS | Status: AC
  Administered 2018-02-11 – 2018-02-12 (×2): 2 g via INTRAVENOUS
  Filled 2018-02-11 (×2): qty 100

## 2018-02-11 MED ORDER — ONDANSETRON HCL 4 MG/2ML IJ SOLN
INTRAMUSCULAR | Status: DC | PRN
  Administered 2018-02-11: 4 mg via INTRAVENOUS

## 2018-02-11 SURGICAL SUPPLY — 70 items
ADH SKN CLS APL DERMABOND .7 (GAUZE/BANDAGES/DRESSINGS) ×1
BAG DECANTER FOR FLEXI CONT (MISCELLANEOUS) ×1 IMPLANT
BAG SPEC THK2 15X12 ZIP CLS (MISCELLANEOUS)
BAG ZIPLOCK 12X15 (MISCELLANEOUS) ×1 IMPLANT
BLADE SAW SGTL 11.0X1.19X90.0M (BLADE) IMPLANT
BLADE SAW SGTL 18X1.27X75 (BLADE) ×2 IMPLANT
BLADE SAW SGTL 18X1.27X75MM (BLADE) ×1
BRUSH FEMORAL CANAL (MISCELLANEOUS) IMPLANT
CABLE CERLAGE W/CRIMP 1.8 (Cable) ×1 IMPLANT
CABLE CERLAGE W/CRIMP 1.8MM (Cable) ×1 IMPLANT
COVER SURGICAL LIGHT HANDLE (MISCELLANEOUS) ×3 IMPLANT
CUP SECTOR GRIPTON 50MM (Cup) ×2 IMPLANT
DERMABOND ADVANCED (GAUZE/BANDAGES/DRESSINGS) ×2
DERMABOND ADVANCED .7 DNX12 (GAUZE/BANDAGES/DRESSINGS) ×1 IMPLANT
DRAPE ORTHO SPLIT 77X108 STRL (DRAPES) ×6
DRAPE POUCH INSTRU U-SHP 10X18 (DRAPES) ×3 IMPLANT
DRAPE SURG 17X11 SM STRL (DRAPES) ×3 IMPLANT
DRAPE SURG ORHT 6 SPLT 77X108 (DRAPES) ×2 IMPLANT
DRAPE U-SHAPE 47X51 STRL (DRAPES) ×3 IMPLANT
DRESSING AQUACEL AG SP 3.5X10 (GAUZE/BANDAGES/DRESSINGS) IMPLANT
DRSG AQUACEL AG ADV 3.5X10 (GAUZE/BANDAGES/DRESSINGS) ×2 IMPLANT
DRSG AQUACEL AG ADV 3.5X14 (GAUZE/BANDAGES/DRESSINGS) IMPLANT
DRSG AQUACEL AG SP 3.5X10 (GAUZE/BANDAGES/DRESSINGS)
DURAPREP 26ML APPLICATOR (WOUND CARE) ×3 IMPLANT
ELECT BLADE TIP CTD 4 INCH (ELECTRODE) ×3 IMPLANT
ELECT REM PT RETURN 15FT ADLT (MISCELLANEOUS) ×3 IMPLANT
ELIMINATOR HOLE APEX DEPUY (Hips) ×2 IMPLANT
FACESHIELD WRAPAROUND (MASK) ×12 IMPLANT
FACESHIELD WRAPAROUND OR TEAM (MASK) ×4 IMPLANT
GAUZE SPONGE 2X2 8PLY STRL LF (GAUZE/BANDAGES/DRESSINGS) ×1 IMPLANT
GLOVE BIOGEL PI IND STRL 7.5 (GLOVE) ×1 IMPLANT
GLOVE BIOGEL PI IND STRL 8.5 (GLOVE) ×1 IMPLANT
GLOVE BIOGEL PI INDICATOR 7.5 (GLOVE) ×2
GLOVE BIOGEL PI INDICATOR 8.5 (GLOVE) ×2
GLOVE ECLIPSE 8.0 STRL XLNG CF (GLOVE) ×4 IMPLANT
GLOVE ORTHO TXT STRL SZ7.5 (GLOVE) ×4 IMPLANT
GOWN STRL REUS W/TWL 2XL LVL3 (GOWN DISPOSABLE) ×3 IMPLANT
GOWN STRL REUS W/TWL LRG LVL3 (GOWN DISPOSABLE) ×3 IMPLANT
HANDPIECE INTERPULSE COAX TIP (DISPOSABLE) ×3
HEAD FEM STD 32X+5 STRL (Hips) ×2 IMPLANT
LINER ACET PNNCL PLUS4 NEUTRAL (Hips) IMPLANT
MANIFOLD NEPTUNE II (INSTRUMENTS) ×3 IMPLANT
MARKER SKIN DUAL TIP RULER LAB (MISCELLANEOUS) ×1 IMPLANT
NDL SAFETY ECLIPSE 18X1.5 (NEEDLE) ×1 IMPLANT
NEEDLE HYPO 18GX1.5 SHARP (NEEDLE)
PINNACLE PLUS 4 NEUTRAL (Hips) ×3 IMPLANT
POSITIONER SURGICAL ARM (MISCELLANEOUS) ×3 IMPLANT
PRESSURIZER FEMORAL UNIV (MISCELLANEOUS) IMPLANT
SCREW 6.5MMX30MM (Screw) ×2 IMPLANT
SCREW 6.5MMX35MM (Screw) ×2 IMPLANT
SET HNDPC FAN SPRY TIP SCT (DISPOSABLE) IMPLANT
SPONGE GAUZE 2X2 STER 10/PKG (GAUZE/BANDAGES/DRESSINGS)
SPONGE LAP 18X18 RF (DISPOSABLE) ×1 IMPLANT
SPONGE LAP 4X18 RFD (DISPOSABLE) ×3 IMPLANT
STAPLER VISISTAT 35W (STAPLE) ×1 IMPLANT
STEM FEM CMNTLSS LG AML 15.0 (Hips) ×2 IMPLANT
SUCTION FRAZIER HANDLE 10FR (MISCELLANEOUS)
SUCTION FRAZIER HANDLE 12FR (TUBING) ×2
SUCTION TUBE FRAZIER 10FR DISP (MISCELLANEOUS) ×1 IMPLANT
SUCTION TUBE FRAZIER 12FR DISP (TUBING) IMPLANT
SUT STRATAFIX PDS+ 0 24IN (SUTURE) ×3 IMPLANT
SUT VIC AB 1 CT1 36 (SUTURE) ×3 IMPLANT
SUT VIC AB 2-0 CT1 27 (SUTURE) ×9
SUT VIC AB 2-0 CT1 TAPERPNT 27 (SUTURE) ×3 IMPLANT
TOWEL OR 17X26 10 PK STRL BLUE (TOWEL DISPOSABLE) ×6 IMPLANT
TOWER CARTRIDGE SMART MIX (DISPOSABLE) IMPLANT
TRAY FOLEY CATH 14FRSI W/METER (CATHETERS) ×2 IMPLANT
TUBE KAMVAC SUCTION (TUBING) IMPLANT
WATER STERILE IRR 1000ML POUR (IV SOLUTION) ×5 IMPLANT
YANKAUER SUCT BULB TIP 10FT TU (MISCELLANEOUS) ×3 IMPLANT

## 2018-02-11 NOTE — Anesthesia Preprocedure Evaluation (Signed)
Anesthesia Evaluation  Patient identified by MRN, date of birth, ID band Patient awake    Reviewed: Allergy & Precautions, NPO status , Patient's Chart, lab work & pertinent test results  History of Anesthesia Complications Negative for: history of anesthetic complications  Airway Mallampati: II  TM Distance: >3 FB Neck ROM: Full    Dental  (+) Partial Lower, Dental Advisory Given   Pulmonary neg pulmonary ROS,    Pulmonary exam normal        Cardiovascular hypertension, Normal cardiovascular exam     Neuro/Psych  Neuromuscular disease negative psych ROS   GI/Hepatic Neg liver ROS, hiatal hernia, GERD  ,  Endo/Other  negative endocrine ROS  Renal/GU negative Renal ROS  negative genitourinary   Musculoskeletal negative musculoskeletal ROS (+)   Abdominal   Peds negative pediatric ROS (+)  Hematology negative hematology ROS (+)   Anesthesia Other Findings   Reproductive/Obstetrics negative OB ROS                             Anesthesia Physical Anesthesia Plan  ASA: III  Anesthesia Plan: Spinal   Post-op Pain Management:    Induction: Intravenous  PONV Risk Score and Plan: Ondansetron and Propofol infusion  Airway Management Planned: Natural Airway and Simple Face Mask  Additional Equipment:   Intra-op Plan:   Post-operative Plan:   Informed Consent: I have reviewed the patients History and Physical, chart, labs and discussed the procedure including the risks, benefits and alternatives for the proposed anesthesia with the patient or authorized representative who has indicated his/her understanding and acceptance.   Dental advisory given  Plan Discussed with: CRNA and Anesthesiologist  Anesthesia Plan Comments:         Anesthesia Quick Evaluation

## 2018-02-11 NOTE — Interval H&P Note (Signed)
History and Physical Interval Note:  02/11/2018 8:51 AM  Angela Preston  has presented today for surgery, with the diagnosis of failed left hip hemiarthroplasty  The various methods of treatment have been discussed with the patient and family. After consideration of risks, benefits and other options for treatment, the patient has consented to  Procedure(s) with comments: LEFT TOTAL HIP REVISION (Left) - 25min as a surgical intervention .  The patient's history has been reviewed, patient examined, no change in status, stable for surgery.  I have reviewed the patient's chart and labs.  Questions were answered to the patient's satisfaction.     Mauri Pole

## 2018-02-11 NOTE — Transfer of Care (Signed)
Immediate Anesthesia Transfer of Care Note  Patient: Angela Preston  Procedure(s) Performed: LEFT TOTAL HIP REVISION (Left Hip)  Patient Location: PACU  Anesthesia Type:Spinal  Level of Consciousness: awake, alert  and oriented  Airway & Oxygen Therapy: Patient Spontanous Breathing and Patient connected to face mask oxygen  Post-op Assessment: Report given to RN and Post -op Vital signs reviewed and stable  Post vital signs: Reviewed and stable  Last Vitals:  Vitals Value Taken Time  BP 120/67 02/11/2018  1:11 PM  Temp    Pulse 61 02/11/2018  1:12 PM  Resp 20 02/11/2018  1:12 PM  SpO2 100 % 02/11/2018  1:12 PM  Vitals shown include unvalidated device data.  Last Pain:  Vitals:   02/11/18 0817  TempSrc:   PainSc: 0-No pain         Complications: No apparent anesthesia complications

## 2018-02-11 NOTE — Op Note (Signed)
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 2.  Left lower extremity shortening through her hemiarthroplasty resulting in lower back symptoms with a history of degenerative lumbar spine disease.  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 2.  Left lower extremity shortening through her hemiarthroplasty resulting in lower back symptoms with a history of degenerative lumbar spine disease.  PROCEDURE:  Revision left hip hemiarthroplasty to total hip arthroplasty utilizing DePuy components, a size 50 mm Gription Pinnacle shell 32+4 neutral Ultrex liner, a size 15 large stature AML femoral stem with a 32+5 Articul/Eze metal ball.  SURGEON:  Paralee Cancel, MD  ASSISTANT:  Danae Orleans, PA-C.  Note that Angela Preston was present for the entirety of the case and preoperative positioning, perioperative management of the operative extremity, general facilitation of the case and primary wound closure.  ANESTHESIA:  Spinal.  SPECIMENS:  None.  COMPLICATIONS:  None apparent.  ESTIMATED BLOOD LOSS:  300 mL  DRAINS:  None.  INDICATIONS:  The patient is a 77 year old female with a history of left hip hemiarthroplasty for femoral neck fracture about 8 years ago.  She had been seen and evaluated in the office for complaints of left hip pain.  Radiographs and bone scan  indicated stable cemented femoral stem.  However, she had acetabular based pain that was unsuccessfully managed conservatively.  She wished to proceed with surgery.  Additionally, we recognize that she was  at least a centimeter if not a little bit more  short on this left side compared to the right.  I then discussed revising the cemented femoral stem to a press-fit component in order to restore leg length and provide a construct that would have a better chance of surviving the rest of her life.  Risks  of infection, DVT component failure, need for future surgeries, perioperative risks discussed as well.  Consent was obtained for benefit of pain relief.  DESCRIPTION OF PROCEDURE:  The patient was brought to the operative theater.  Once adequate anesthesia, preoperative antibiotics, Ancef administered, she was positioned into the right lateral decubitus position with the left hip up.  Her left lower  extremity was prepped and draped in sterile fashion.  A timeout was performed identifying the patient, the planned procedure and extremity.  Her old incision was identified and a portion of it used on the distal aspect and extended proximal for exposure.   Soft tissue dissection was carried down to the iliotibial band and gluteal fascia.  These were then split for posterior approach to the hip.  The posterior aspect of the hip was exposed.  An arthrotomy was then made excising some of the superficial  scar, but preserving the inferior and superior leaflets.  Once I had the hip adequately exposed, we dislocated the hip and removed the femoral head.  As was per my preoperative plan, I wanted to remove her femoral stem to restore leg lengths back to a more normal level.  This required removing of her cemented stem.  We used a combination of the [place] cement removal set as well as thin osteotomes to  work around the proximal aspect of the stem.  After I  had done this, I was able to remove the stem using a SLAP.  However, the cement mantle remained intact.  I then used again the combination of these osteotomes as well as drilling distally through the  cement mantle, which was very shallow at the distal aspect and  then using a back scratching osteotome to remove the cement.  We eventually removed all the cement.  I then began reaming with a 9 mm reamer, then had ream up to 14 mm before we got some  chatter in the proximal third of the femur.  I reamed to 14.5 and thus selected a 15 mm stem.  Once I completed the reaming and preparation of the femur, we moved to the acetabular prep.  When the retractors were placed I finalized removal of soft  tissues around the acetabulum and began reaming with a 44 mm reamer.  We then reamed up to a 49 mm reamer.  Her bone consistent with the weightbearing to a hemiarthroplasty was very sclerotic in the acetabulum.  I was able to get some subchondral bone  bleeding, but I did drill some holes into the superior ilium to try to accentuate some of the healing of this area.  Based on the bone quality, I selected a Gription Pinnacle shell to try to enhance bony ongrowth and ingrowth into the shell.  The final  shell was opened.  It was then impacted.  Using the hip guide, I confirmed that the hip was approximately 35-40 degrees of abduction and 20 degrees of forward flexion.  It was beneath the anterior rim of the acetabulum.  Given this, I drilled and placed  2 cancellous screws and then a 32+4 neutral trial liner.  At this point, we turned attention to the femur.  I began broaching with a 13 standard then 15 standard and then a 15 large offset stem.  This did allow for the component to sit proud of the  previous neck cut, which was all the way down to the lesser neck and lesser trochanter.  I did a trial reduction at this point.  Based on the removal of femoral component, particularly with cement, I did have x-ray in the room.  Plain film radiograph was obtained with the leg in neutral position.  Here we identified that there was no evidence of any complications distally.  In the proximal aspect laterally near the area of the lesser trochanter there was an area of a crack.   This was  useful information as I then planned to place a cable around this area.  Given these findings, x-ray was taken out of way and we returned attention to the hip.  Based on these findings, I removed the trial liner and the final 32+4 neutral AltrX  liner was impacted.  At this point, I placed a cable around the femur just distal to the lesser trochanter around the lateral aspect of the femoral cortex.  This was tensioned down to about 70 pounds of units of pressure, thus not all the way.  At this  point, we opened up the size 15 large stature femoral stem.  It was then impacted and sat at the level where the broach was.  I did a repeat trial reduction, a 32+1 ball and felt that it reduced fairly easily.  There did not appear to be any crepitation  on movement of the femur separate from the hip.  Nonetheless, we did repeat an x-ray to make certain that things were stable.  Here it appeared that her femoral head was level with the tip of the trochanter.  There is no evidence of any complications  distally.  In the trial reduction, there was no evidence of any subluxation with forward flexion, internal rotation or impingement with extension, external rotation.  Given the findings, I selected a 32+5 ball.  This was impacted on a clean and dried trunnion and  the hip was reduced.  We irrigated the hip throughout the case.  I reapproximated the posterior capsular tissues using #1 Vicryl.  The iliotibial band and gluteal fascia were reapproximated using a combination of #1 Vicryl and Stratafix sutures.  The  remainder of the wound was closed with 2-0 Vicryl and a running Monocryl stitch.  The hip was clean, dry and dressed sterilely using surgical glue and Aquacel dressing.  She was then brought to the recovery room in stable condition, tolerating the  procedure well.   Postoperatively, we will have her be partial weightbearing for 4-6 weeks to make certain we have adequate healing.  Posterior hip precautions with  physical therapy will be reviewed.  TN/NUANCE  D:02/11/2018 T:02/11/2018 JOB:[id number]

## 2018-02-11 NOTE — Anesthesia Postprocedure Evaluation (Signed)
Anesthesia Post Note  Patient: Angela Preston  Procedure(s) Performed: LEFT TOTAL HIP REVISION (Left Hip)     Patient location during evaluation: PACU Anesthesia Type: Spinal Level of consciousness: oriented and awake and alert Pain management: pain level controlled Vital Signs Assessment: post-procedure vital signs reviewed and stable Respiratory status: spontaneous breathing, respiratory function stable and patient connected to nasal cannula oxygen Cardiovascular status: blood pressure returned to baseline and stable Postop Assessment: no headache, no backache and no apparent nausea or vomiting Anesthetic complications: no    Last Vitals:  Vitals:   02/11/18 1435 02/11/18 1533  BP: (!) 142/73 133/76  Pulse: 65 72  Resp: 16   Temp: 36.4 C 36.4 C  SpO2: 100% 95%    Last Pain:  Vitals:   02/11/18 1533  TempSrc: Oral  PainSc:                  Shakila Mak DAVID

## 2018-02-11 NOTE — Progress Notes (Signed)
PT Cancellation Note  Patient Details Name: BRINLEIGH TEW MRN: [id number] DOB: 03-04-1941   Cancelled Treatment:    Reason Eval/Treat Not Completed: Pain limiting ability to participate - Pt states she is in too much pain to mobilize, and would like to defer PT until tomorrow. Will check back tomorrow am.   Julien Girt, PT Acute Rehabilitation Services Pager [phone]  Office [phone]    [place] 02/11/2018, 6:06 PM

## 2018-02-11 NOTE — Brief Op Note (Signed)
02/11/2018  12:45 PM  PATIENT:  Angela Preston  77 y.o. female  PRE-OPERATIVE DIAGNOSIS:  failed left hip hemiarthroplasty  POST-OPERATIVE DIAGNOSIS:  failed left hip hemiarthroplasty  PROCEDURE:  Procedure(s) with comments: LEFT TOTAL HIP REVISION (Left) - 36min  SURGEON:  Surgeon(s) and Role:    Paralee Cancel, MD - Primary  PHYSICIAN ASSISTANT: Danae Orleans, PA-C  ANESTHESIA:   spinal  EBL:  300 mL   BLOOD ADMINISTERED:none  DRAINS: none   LOCAL MEDICATIONS USED:  NONE  SPECIMEN:  No Specimen  DISPOSITION OF SPECIMEN:  N/A  COUNTS:  YES  TOURNIQUET:  * No tourniquets in log *  DICTATION: .Other Dictation: Dictation Number [id number]  PLAN OF CARE: Admit to inpatient   PATIENT DISPOSITION:  PACU - hemodynamically stable.   Delay start of Pharmacological VTE agent (>24hrs) due to surgical blood loss or risk of bleeding: no

## 2018-02-11 NOTE — Anesthesia Procedure Notes (Signed)
Spinal  Start time: 02/11/2018 10:34 AM End time: 02/11/2018 10:38 AM Staffing Resident/CRNA: Gean Maidens, CRNA Performed: resident/CRNA  Preanesthetic Checklist Completed: patient identified, site marked, surgical consent, pre-op evaluation, timeout performed, IV checked, risks and benefits discussed and monitors and equipment checked Spinal Block Patient position: sitting Prep: DuraPrep Patient monitoring: heart rate, continuous pulse ox and blood pressure Approach: midline Location: L3-4 Injection technique: single-shot Needle Needle type: Pencan  Needle gauge: 24 G Needle length: 9 cm Needle insertion depth: 6 cm Additional Notes Pt sitting position sterile prep and drape negative paresthesia/heme.

## 2018-02-12 ENCOUNTER — Encounter (HOSPITAL_COMMUNITY): Payer: Self-pay | Admitting: Orthopedic Surgery

## 2018-02-12 DIAGNOSIS — E663 Overweight: Secondary | ICD-10-CM | POA: Diagnosis present

## 2018-02-12 LAB — CBC
HEMATOCRIT: 32.1 % — AB (ref 36.0–46.0)
HEMOGLOBIN: 10.8 g/dL — AB (ref 12.0–15.0)
MCH: 32.1 pg (ref 26.0–34.0)
MCHC: 33.6 g/dL (ref 30.0–36.0)
MCV: 95.5 fL (ref 78.0–100.0)
Platelets: 273 10*3/uL (ref 150–400)
RBC: 3.36 MIL/uL — ABNORMAL LOW (ref 3.87–5.11)
RDW: 12.5 % (ref 11.5–15.5)
WBC: 12.8 10*3/uL — AB (ref 4.0–10.5)

## 2018-02-12 LAB — BASIC METABOLIC PANEL
ANION GAP: 10 (ref 5–15)
BUN: 20 mg/dL (ref 8–23)
CALCIUM: 8.3 mg/dL — AB (ref 8.9–10.3)
CO2: 24 mmol/L (ref 22–32)
Chloride: 105 mmol/L (ref 98–111)
Creatinine, Ser: 1 mg/dL (ref 0.44–1.00)
GFR calc non Af Amer: 53 mL/min — ABNORMAL LOW (ref >60)
GLUCOSE: 144 mg/dL — AB (ref 70–99)
POTASSIUM: 4.6 mmol/L (ref 3.5–5.1)
Sodium: 139 mmol/L (ref 135–145)

## 2018-02-12 MED ORDER — FERROUS SULFATE 325 (65 FE) MG PO TABS: 325.0000 mg | ORAL_TABLET | Freq: Three times a day (TID) | ORAL | 3 refills | Status: DC

## 2018-02-12 MED ORDER — DOCUSATE SODIUM 100 MG PO CAPS: 100.0000 mg | ORAL_CAPSULE | Freq: Two times a day (BID) | ORAL | 0 refills | Status: DC

## 2018-02-12 MED ORDER — METHOCARBAMOL 500 MG PO TABS: 500.0000 mg | ORAL_TABLET | Freq: Four times a day (QID) | ORAL | 0 refills | Status: DC | PRN

## 2018-02-12 MED ORDER — HYDROCODONE-ACETAMINOPHEN 7.5-325 MG PO TABS: 1.0000 | ORAL_TABLET | ORAL | 0 refills | Status: DC | PRN

## 2018-02-12 MED ORDER — ASPIRIN 81 MG PO CHEW: 81.0000 mg | CHEWABLE_TABLET | Freq: Two times a day (BID) | ORAL | 0 refills | Status: AC

## 2018-02-12 MED ORDER — POLYETHYLENE GLYCOL 3350 17 G PO PACK: 17.0000 g | PACK | Freq: Two times a day (BID) | ORAL | 0 refills | Status: DC

## 2018-02-12 NOTE — Progress Notes (Signed)
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  Pt with hx of STEMI  Clinical Impression  Pt s/p L THR and presents with decreased L LE strength/ROM, post op pain, PWB and posterior THP limiting functional mobility.  Pt hopes to progress to dc home with family assist.    Follow Up Recommendations Follow surgeon's recommendation for DC plan and follow-up therapies    Equipment Recommendations  None recommended by PT    Recommendations for Other Services OT consult     Precautions / Restrictions Precautions Precautions: Posterior Hip;Fall Precaution Booklet Issued: Yes (comment) Precaution Comments: Reviewed THP x 2 with pt and provided in writing Restrictions Weight Bearing Restrictions: Yes LLE Weight Bearing: Partial weight bearing      Mobility  Bed Mobility Overal bed mobility: Needs Assistance Bed Mobility: Supine to Sit     Supine to sit: Min assist;Mod assist     General bed mobility comments: cues for sequence and use of R LE to self assist  Transfers Overall transfer level: Needs assistance Equipment used: Rolling walker (2 wheeled) Transfers: Sit to/from Stand Sit to Stand: Min assist;Mod assist;+2 safety/equipment         General transfer comment: cues for LE management, adherence to THP and use of UEs to self assist  Ambulation/Gait Ambulation/Gait assistance: Min assist;+2 safety/equipment Gait Distance (Feet): 24 Feet Assistive device: Rolling walker (2 wheeled) Gait Pattern/deviations: Step-to pattern;Decreased step length - right;Decreased step length - left;Shuffle;Trunk flexed Gait velocity: dec   General Gait Details: cues for sequence, posture, position from RW and PWB  Stairs            Wheelchair Mobility    Modified Rankin (Stroke Patients Only)       Balance Overall  balance assessment: Needs assistance Sitting-balance support: No upper extremity supported;Feet supported Sitting balance-Leahy Scale: Good     Standing balance support: Bilateral upper extremity supported Standing balance-Leahy Scale: Poor                               Pertinent Vitals/Pain Pain Assessment: 0-10 Pain Score: 4  Pain Location: L hip Pain Descriptors / Indicators: Aching;Sore Pain Intervention(s): Limited activity within patient's tolerance;Monitored during session;Premedicated before session;Ice applied    Home Living Family/patient expects to be discharged to:: Private residence Living Arrangements: Spouse/significant other Available Help at Discharge: Family Type of Home: House Home Access: Stairs to enter Entrance Stairs-Rails: Right;Left;Can reach both Entrance Stairs-Number of Steps: 2 Home Layout: One level Home Equipment: Environmental consultant - 2 wheels;Bedside commode      Prior Function Level of Independence: Independent               Hand Dominance        Extremity/Trunk Assessment   Upper Extremity Assessment Upper Extremity Assessment: Overall WFL for tasks assessed    Lower Extremity Assessment Lower Extremity Assessment: LLE deficits/detail LLE Deficits / Details: 2+/5 strength with AAROM at hip to 85 flex and 15 abd       Communication   Communication: No difficulties  Cognition Arousal/Alertness: Awake/alert Behavior During Therapy: WFL for tasks assessed/performed Overall Cognitive Status: Within Functional Limits for tasks assessed  General Comments      Exercises Total Joint Exercises Ankle Circles/Pumps: AROM;Both;15 reps;Supine Quad Sets: AROM;Both;10 reps;Supine Heel Slides: AAROM;Left;20 reps;Supine Hip ABduction/ADduction: AAROM;Left;15 reps;Supine   Assessment/Plan    PT Assessment Patient needs continued PT services  PT Problem List Decreased  strength;Decreased range of motion;Decreased activity tolerance;Decreased balance;Decreased mobility;Decreased knowledge of use of DME;Pain       PT Treatment Interventions DME instruction;Gait training;Stair training;Functional mobility training;Therapeutic activities;Therapeutic exercise;Patient/family education    PT Goals (Current goals can be found in the Care Plan section)  Acute Rehab PT Goals Patient Stated Goal: Regain IND PT Goal Formulation: With patient Time For Goal Achievement: 02/19/18 Potential to Achieve Goals: Good    Frequency 7X/week   Barriers to discharge        Co-evaluation               AM-PAC PT "6 Clicks" Daily Activity  Outcome Measure Difficulty turning over in bed (including adjusting bedclothes, sheets and blankets)?: Unable Difficulty moving from lying on back to sitting on the side of the bed? : Unable Difficulty sitting down on and standing up from a chair with arms (e.g., wheelchair, bedside commode, etc,.)?: Unable Help needed moving to and from a bed to chair (including a wheelchair)?: A Lot Help needed walking in hospital room?: A Lot Help needed climbing 3-5 steps with a railing? : A Lot 6 Click Score: 9    End of Session Equipment Utilized During Treatment: Gait belt Activity Tolerance: Patient tolerated treatment well;Patient limited by fatigue Patient left: in chair;with call bell/phone within reach;with chair alarm set Nurse Communication: Mobility status PT Visit Diagnosis: Difficulty in walking, not elsewhere classified (R26.2)    Time: 1016-1050 PT Time Calculation (min) (ACUTE ONLY): 34 min   Charges:   PT Evaluation $PT Eval Low Complexity: 1 Low PT Treatments $Therapeutic Exercise: 8-22 mins        [facility] Pager [phone] Office [phone]   Chakira Jachim 02/12/2018, 2:42 PM

## 2018-02-12 NOTE — Plan of Care (Signed)

## 2018-02-12 NOTE — Progress Notes (Signed)
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  Pt with hx of STEMI    PT Comments    Pt very motivated but pt fatigues easily limiting progress with mobility.   Follow Up Recommendations  Follow surgeon's recommendation for DC plan and follow-up therapies     Equipment Recommendations  None recommended by PT    Recommendations for Other Services OT consult     Precautions / Restrictions Precautions Precautions: Posterior Hip;Fall Precaution Booklet Issued: Yes (comment) Precaution Comments: Reviewed THP x 2 with pt and provided in writing Restrictions Weight Bearing Restrictions: Yes LLE Weight Bearing: Partial weight bearing    Mobility  Bed Mobility Overal bed mobility: Needs Assistance Bed Mobility: Sit to Supine     Supine to sit: Min assist;Mod assist Sit to supine: Min assist;Mod assist   General bed mobility comments: cues for sequence and use of R LE to self assist  Transfers Overall transfer level: Needs assistance Equipment used: Rolling walker (2 wheeled) Transfers: Sit to/from Stand Sit to Stand: Min assist;Mod assist;+2 safety/equipment         General transfer comment: cues for LE management, adherence to THP and use of UEs to self assist  Ambulation/Gait Ambulation/Gait assistance: Min assist;+2 safety/equipment Gait Distance (Feet): 20 Feet(20' twice to/from bathroom) Assistive device: Rolling walker (2 wheeled) Gait Pattern/deviations: Step-to pattern;Decreased step length - right;Decreased step length - left;Shuffle;Trunk flexed Gait velocity: dec   General Gait Details: cues for sequence, posture, position from RW and PWB   Stairs             Wheelchair Mobility    Modified Rankin (Stroke Patients Only)       Balance Overall balance assessment: Needs  assistance Sitting-balance support: No upper extremity supported;Feet supported Sitting balance-Leahy Scale: Good     Standing balance support: Bilateral upper extremity supported Standing balance-Leahy Scale: Poor                              Cognition Arousal/Alertness: Awake/alert Behavior During Therapy: WFL for tasks assessed/performed Overall Cognitive Status: Within Functional Limits for tasks assessed                                        Exercises Total Joint Exercises Ankle Circles/Pumps: AROM;Both;15 reps;Supine Quad Sets: AROM;Both;10 reps;Supine Heel Slides: AAROM;Left;20 reps;Supine Hip ABduction/ADduction: AAROM;Left;15 reps;Supine    General Comments        Pertinent Vitals/Pain Pain Assessment: 0-10 Pain Score: 4  Pain Location: L hip Pain Descriptors / Indicators: Aching;Sore Pain Intervention(s): Limited activity within patient's tolerance;Monitored during session;Premedicated before session;Ice applied    Home Living Family/patient expects to be discharged to:: Private residence Living Arrangements: Spouse/significant other Available Help at Discharge: Family Type of Home: House Home Access: Stairs to enter Entrance Stairs-Rails: Right;Left;Can reach both Home Layout: One level Home Equipment: Environmental consultant - 2 wheels;Bedside commode      Prior Function Level of Independence: Independent          PT Goals (current goals can now be found in the care plan section) Acute Rehab PT Goals Patient Stated Goal: Regain IND PT Goal Formulation: With patient Time For Goal Achievement: 02/19/18 Potential to Achieve Goals: Good Progress towards PT goals: Progressing  toward goals    Frequency    7X/week      PT Plan Current plan remains appropriate    Co-evaluation              AM-PAC PT "6 Clicks" Daily Activity  Outcome Measure  Difficulty turning over in bed (including adjusting bedclothes, sheets and  blankets)?: Unable Difficulty moving from lying on back to sitting on the side of the bed? : Unable Difficulty sitting down on and standing up from a chair with arms (e.g., wheelchair, bedside commode, etc,.)?: Unable Help needed moving to and from a bed to chair (including a wheelchair)?: A Lot Help needed walking in hospital room?: A Lot Help needed climbing 3-5 steps with a railing? : A Lot 6 Click Score: 9    End of Session Equipment Utilized During Treatment: Gait belt Activity Tolerance: Patient tolerated treatment well;Patient limited by fatigue Patient left: in bed;with call bell/phone within reach Nurse Communication: Mobility status PT Visit Diagnosis: Difficulty in walking, not elsewhere classified (R26.2)     Time: 9643-8381 PT Time Calculation (min) (ACUTE ONLY): 23 min  Charges:  $Gait Training: 8-22 mins $Therapeutic Exercise: 8-22 mins $Therapeutic Activity: 8-22 mins                     [place] Pager [phone] Office [phone]    Jazilyn Siegenthaler 02/12/2018, 2:48 PM

## 2018-02-12 NOTE — Progress Notes (Signed)
     Subjective: 1 Day Post-Op Procedure(s) (LRB): LEFT TOTAL HIP REVISION (Left)   Patient reports pain as mild, pain controlled. No events throughout the night .  Somewhat lightheaded/dizzy this morning.  Discussed PWB status.   Objective:   VITALS:   Vitals:   02/12/18 0128 02/12/18 0527  BP: (!) 95/52 111/68  Pulse: 70 62  Resp: 18 17  Temp: 97.7 F (36.5 C) 97.7 F (36.5 C)  SpO2: 97% 97%    Dorsiflexion/Plantar flexion intact Incision: dressing C/D/I No cellulitis present Compartment soft  LABS Recent Labs    02/12/18 0449  HGB 10.8*  HCT 32.1*  WBC 12.8*  PLT 273    Recent Labs    02/12/18 0449  NA 139  K 4.6  BUN 20  CREATININE 1.00  GLUCOSE 144*     Assessment/Plan: 1 Day Post-Op Procedure(s) (LRB): LEFT TOTAL HIP REVISION (Left) Foley cath d/c'ed Advance diet Up with therapy D/C IV fluids Discharge home when ready, possibly tomorrow  Overweight (BMI 25-29.9) Estimated body mass index is 28.53 kg/m as calculated from the following:   Height as of this encounter: 5\' 1"  (1.549 m).   Weight as of this encounter: 68.5 kg. Patient also counseled that weight may inhibit the healing process Patient counseled that losing weight will help with future health issues         Angela Preston   PAC  02/12/2018, 7:56 AM

## 2018-02-13 LAB — BASIC METABOLIC PANEL
Anion gap: 9 (ref 5–15)
BUN: 24 mg/dL — ABNORMAL HIGH (ref 8–23)
CHLORIDE: 106 mmol/L (ref 98–111)
CO2: 25 mmol/L (ref 22–32)
Calcium: 8.6 mg/dL — ABNORMAL LOW (ref 8.9–10.3)
Creatinine, Ser: 0.93 mg/dL (ref 0.44–1.00)
GFR calc non Af Amer: 57 mL/min — ABNORMAL LOW (ref >60)
Glucose, Bld: 130 mg/dL — ABNORMAL HIGH (ref 70–99)
POTASSIUM: 4.2 mmol/L (ref 3.5–5.1)
SODIUM: 140 mmol/L (ref 135–145)

## 2018-02-13 LAB — CBC
HCT: 34.9 % — ABNORMAL LOW (ref 36.0–46.0)
Hemoglobin: 11.4 g/dL — ABNORMAL LOW (ref 12.0–15.0)
MCH: 31.1 pg (ref 26.0–34.0)
MCHC: 32.7 g/dL (ref 30.0–36.0)
MCV: 95.4 fL (ref 78.0–100.0)
Platelets: 317 10*3/uL (ref 150–400)
RBC: 3.66 MIL/uL — ABNORMAL LOW (ref 3.87–5.11)
RDW: 12.6 % (ref 11.5–15.5)
WBC: 17 10*3/uL — AB (ref 4.0–10.5)

## 2018-02-13 MED ORDER — ONDANSETRON HCL 4 MG PO TABS: 4.0000 mg | ORAL_TABLET | Freq: Three times a day (TID) | ORAL | 0 refills | Status: DC | PRN

## 2018-02-13 NOTE — Progress Notes (Signed)
OT Cancellation Note  Patient Details Name: Angela Preston MRN: [id number] DOB: Dec 09, 1940   Cancelled Treatment:    Reason Eval/Treat Not Completed: Other (comment). Spoke with PT who reports pt doing much better today and pt's husband will A her with all LBADLs due to posterior precautions. No OT needs identified per PT. We will sign off.  Almon Register [phone] 02/13/2018, 12:50 PM

## 2018-02-13 NOTE — Progress Notes (Signed)
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  Pt with hx of STEMI    PT Comments    Pt progressing well and spouse present for family ed.  Reviewed stairs, car transfers, lower body dressing, shower transfers, and home therex with written instruction provided.  Follow Up Recommendations  Follow surgeon's recommendation for DC plan and follow-up therapies     Equipment Recommendations  None recommended by PT    Recommendations for Other Services OT consult     Precautions / Restrictions Precautions Precautions: Posterior Hip;Fall Precaution Booklet Issued: Yes (comment) Precaution Comments: Reviewed THP x 2 with pt and spouse.  Provided in writing Restrictions Weight Bearing Restrictions: Yes LLE Weight Bearing: Partial weight bearing    Mobility  Bed Mobility Overal bed mobility: Needs Assistance Bed Mobility: Supine to Sit     Supine to sit: Min guard     General bed mobility comments: cues for sequence and use of R LE to self assist  Transfers Overall transfer level: Needs assistance Equipment used: Rolling walker (2 wheeled) Transfers: Sit to/from Stand Sit to Stand: Min guard         General transfer comment: cues for LE management, adherence to THP and use of UEs to self assist  Ambulation/Gait Ambulation/Gait assistance: Min guard Gait Distance (Feet): 50 Feet Assistive device: Rolling walker (2 wheeled) Gait Pattern/deviations: Step-to pattern;Decreased step length - right;Decreased step length - left;Shuffle;Trunk flexed Gait velocity: dec   General Gait Details: cues for sequence, posture, position from RW and PWB   Stairs Stairs: Yes Stairs assistance: Min assist Stair Management: Two rails;Step to pattern;Forwards Number of Stairs: 2 General stair comments: cues for sequence and foot  placement   Wheelchair Mobility    Modified Rankin (Stroke Patients Only)       Balance Overall balance assessment: Needs assistance Sitting-balance support: No upper extremity supported;Feet supported Sitting balance-Leahy Scale: Good     Standing balance support: Bilateral upper extremity supported Standing balance-Leahy Scale: Fair                              Cognition Arousal/Alertness: Awake/alert Behavior During Therapy: WFL for tasks assessed/performed Overall Cognitive Status: Within Functional Limits for tasks assessed                                        Exercises Total Joint Exercises Ankle Circles/Pumps: AROM;Both;15 reps;Supine Quad Sets: AROM;Both;10 reps;Supine Heel Slides: AAROM;Left;20 reps;Supine Hip ABduction/ADduction: AAROM;Left;15 reps;Supine Long Arc Quad: Left;10 reps;Seated;AROM    General Comments        Pertinent Vitals/Pain Pain Assessment: 0-10 Pain Score: 4  Pain Location: L hip Pain Descriptors / Indicators: Aching;Sore Pain Intervention(s): Limited activity within patient's tolerance;Monitored during session;Premedicated before session    Home Living                      Prior Function            PT Goals (current goals can now be found in the care plan section) Acute Rehab PT Goals Patient Stated Goal: Regain IND PT Goal Formulation: With patient Time For Goal Achievement: 02/19/18 Potential to Achieve Goals: Good Progress towards PT goals: Progressing toward goals  Frequency    7X/week      PT Plan Current plan remains appropriate    Co-evaluation              AM-PAC PT "6 Clicks" Daily Activity  Outcome Measure  Difficulty turning over in bed (including adjusting bedclothes, sheets and blankets)?: A Lot Difficulty moving from lying on back to sitting on the side of the bed? : A Lot Difficulty sitting down on and standing up from a chair with arms (e.g.,  wheelchair, bedside commode, etc,.)?: A Lot Help needed moving to and from a bed to chair (including a wheelchair)?: A Little Help needed walking in hospital room?: A Little Help needed climbing 3-5 steps with a railing? : A Little 6 Click Score: 15    End of Session Equipment Utilized During Treatment: Gait belt Activity Tolerance: Patient tolerated treatment well;Patient limited by fatigue Patient left: in bed;with call bell/phone within reach Nurse Communication: Mobility status PT Visit Diagnosis: Difficulty in walking, not elsewhere classified (R26.2)     Time: 7939-0300 PT Time Calculation (min) (ACUTE ONLY): 45 min  Charges:  $Gait Training: 8-22 mins $Therapeutic Exercise: 8-22 mins $Therapeutic Activity: 8-22 mins                     [place] Pager [phone] Office [phone]    Maimuna Leaman 02/13/2018, 1:13 PM
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
## 2018-02-13 NOTE — Progress Notes (Signed)
Per CM handoff written by Sunday Spillers 9/27 "Home/spouse, No PT, has DME, d/c 9/28"

## 2018-02-13 NOTE — Progress Notes (Signed)
    Subjective:  Patient reports pain as mild to moderate.  Denies N/CP/SOB. Had nausea resolved with meds.  Objective:   VITALS:   Vitals:   02/12/18 0916 02/12/18 1322 02/12/18 2159 02/13/18 0537  BP: 129/66 96/85 130/64 123/60  Pulse: 77 70 66 63  Resp: 20 20 16 16   Temp: 98.2 F (36.8 C) 98.1 F (36.7 C) 97.8 F (36.6 C) (!) 97.3 F (36.3 C)  TempSrc: Oral Oral Oral Oral  SpO2: 94% 100%  96%  Weight:      Height:        NAD ABD soft Intact pulses distally Dorsiflexion/Plantar flexion intact Incision: dressing C/D/I Compartment soft   Lab Results  Component Value Date   WBC 17.0 (H) 02/13/2018   HGB 11.4 (L) 02/13/2018   HCT 34.9 (L) 02/13/2018   MCV 95.4 02/13/2018   PLT 317 02/13/2018   BMET    Component Value Date/Time   NA 140 02/13/2018 0407   K 4.2 02/13/2018 0407   CL 106 02/13/2018 0407   CO2 25 02/13/2018 0407   GLUCOSE 130 (H) 02/13/2018 0407   BUN 24 (H) 02/13/2018 0407   CREATININE 0.93 02/13/2018 0407   CREATININE 0.82 01/29/2015 0913   CALCIUM 8.6 (L) 02/13/2018 0407   GFRNONAA 57 (L) 02/13/2018 0407   GFRAA >60 02/13/2018 0407     Assessment/Plan: 2 Days Post-Op   Principal Problem:   S/P revision of left hip Active Problems:   Overweight (BMI 25.0-29.9)   WBAT with walker DVT ppx: Aspirin, SCDs, TEDS PO pain control PT/OT Dispo: D/c home today, Rx for zofran provided    Angela Preston 02/13/2018, 10:00 AM   Rod Can, MD Cell [phone]

## 2018-02-26 DIAGNOSIS — Z471 Aftercare following joint replacement surgery: Secondary | ICD-10-CM | POA: Diagnosis not present

## 2018-02-26 DIAGNOSIS — Z96642 Presence of left artificial hip joint: Secondary | ICD-10-CM | POA: Diagnosis not present

## 2018-03-31 DIAGNOSIS — Z96642 Presence of left artificial hip joint: Secondary | ICD-10-CM | POA: Diagnosis not present

## 2018-03-31 DIAGNOSIS — Z471 Aftercare following joint replacement surgery: Secondary | ICD-10-CM | POA: Diagnosis not present

## 2018-04-26 DIAGNOSIS — Z23 Encounter for immunization: Secondary | ICD-10-CM | POA: Diagnosis not present

## 2018-05-05 DIAGNOSIS — Z96642 Presence of left artificial hip joint: Secondary | ICD-10-CM | POA: Diagnosis not present

## 2018-05-05 DIAGNOSIS — Z471 Aftercare following joint replacement surgery: Secondary | ICD-10-CM | POA: Diagnosis not present

## 2018-05-25 ENCOUNTER — Ambulatory Visit (HOSPITAL_COMMUNITY)
Admission: RE | Admit: 2018-05-25 | Discharge: 2018-05-25 | Disposition: A | Discharge status: Home / Self Care | Payer: Medicare Other | Source: Ambulatory Visit | Attending: Cardiovascular Disease | Admitting: Cardiovascular Disease

## 2018-05-25 ENCOUNTER — Encounter (HOSPITAL_COMMUNITY): Payer: Self-pay

## 2018-05-25 ENCOUNTER — Ambulatory Visit (INDEPENDENT_AMBULATORY_CARE_PROVIDER_SITE_OTHER): Payer: Medicare Other | Admitting: Cardiovascular Disease

## 2018-05-25 ENCOUNTER — Encounter: Payer: Self-pay | Admitting: Cardiovascular Disease

## 2018-05-25 ENCOUNTER — Other Ambulatory Visit (HOSPITAL_COMMUNITY): Payer: Self-pay | Admitting: Orthopedic Surgery

## 2018-05-25 VITALS — BP 150/82 | HR 73 | Ht 61.0 in | Wt 138.4 lb

## 2018-05-25 DIAGNOSIS — M7989 Other specified soft tissue disorders: Secondary | ICD-10-CM | POA: Insufficient documentation

## 2018-05-25 DIAGNOSIS — I82442 Acute embolism and thrombosis of left tibial vein: Secondary | ICD-10-CM

## 2018-05-25 DIAGNOSIS — I82409 Acute embolism and thrombosis of unspecified deep veins of unspecified lower extremity: Secondary | ICD-10-CM

## 2018-05-25 DIAGNOSIS — I10 Essential (primary) hypertension: Secondary | ICD-10-CM | POA: Diagnosis not present

## 2018-05-25 DIAGNOSIS — Z86718 Personal history of other venous thrombosis and embolism: Secondary | ICD-10-CM | POA: Insufficient documentation

## 2018-05-25 HISTORY — DX: Acute embolism and thrombosis of unspecified deep veins of unspecified lower extremity: I82.409

## 2018-05-25 MED ORDER — RIVAROXABAN (XARELTO) VTE STARTER PACK (15 & 20 MG): ORAL_TABLET | ORAL | 0 refills | Status: DC

## 2018-05-25 MED ORDER — RIVAROXABAN 20 MG PO TABS: 20.0000 mg | ORAL_TABLET | Freq: Every day | ORAL | 4 refills | Status: DC

## 2018-05-25 NOTE — Patient Instructions (Addendum)
Medication Instructions:  START XARELTO 15 MG TWICE A DAY (EVERY 12 HOURS) WITH FOOD FOR 21 DAYS. AFTER 21 DAYS SWITCH TO XARELTO 20 MG ONCE DAILY WITH EVENING MEAL   If you need a refill on your cardiac medications before your next appointment, please call your pharmacy.   Lab work: NONE  Testing/Procedures: NONE  Follow-Up: Your physician recommends that you schedule a follow-up appointment in: 06/24/2018 AT 9:30 WITH H MENG PA   Any Other Special Instructions Will Be Listed Below (If Applicable). MONITOR YOUR BLOOD PRESSURE AND BRING READINGS TO FOLLOW UP IN 1 MONTHS   Deep Vein Thrombosis  Deep vein thrombosis (DVT) is a condition in which a blood clot forms in a deep vein, such as a lower leg, thigh, or arm vein. A clot is blood that has thickened into a gel or solid. This condition is dangerous. It can lead to serious and even life-threatening complications if the clot travels to the lungs and causes a blockage (pulmonary embolism). It can also damage veins in the leg. This can result in leg pain, swelling, discoloration, and sores (post-thrombotic syndrome). What are the causes? This condition may be caused by:  A slowdown of blood flow.  Damage to a vein.  A condition that causes blood to clot more easily, such as an inherited clotting disorder. What increases the risk? The following factors may make you more likely to develop this condition:  Being overweight.  Being older, especially over age 25.  Sitting or lying down for more than four hours.  Being in the hospital.  Lack of physical activity (sedentary lifestyle).  Pregnancy, being in childbirth, or having recently given birth.  Taking medicines that contain estrogen, such as medicines to prevent pregnancy.  Smoking.  A history of any of the following: ? Blood clots or a blood clotting disease. ? Peripheral vascular disease. ? Inflammatory bowel disease. ? Cancer. ? Heart disease. ? Genetic conditions  that affect how your blood clots, such as Factor V Leiden mutation. ? Neurological diseases that affect your legs (leg paresis). ? A recent injury, such as a car accident. ? Major or lengthy surgery. ? A central line placed inside a large vein. What are the signs or symptoms? Symptoms of this condition include:  Swelling, pain, or tenderness in an arm or leg.  Warmth, redness, or discoloration in an arm or leg. If the clot is in your leg, symptoms may be more noticeable or worse when you stand or walk. Some people may not develop any symptoms. How is this diagnosed? This condition is diagnosed with:  A medical history and physical exam.  Tests, such as: ? Blood tests. These are done to check how well your blood clots. ? Ultrasound. This is done to check for clots. ? Venogram. For this test, contrast dye is injected into a vein and X-rays are taken to check for any clots. How is this treated? Treatment for this condition depends on:  The cause of your DVT.  Your risk for bleeding or developing more clots.  Any other medical conditions that you have. Treatment may include:  Taking a blood thinner (anticoagulant). This type of medicine prevents clots from forming. It may be taken by mouth, injected under the skin, or injected through an IV (catheter).  Injecting clot-dissolving medicines into the affected vein (catheter-directed thrombolysis).  Having surgery. Surgery may be done to: ? Remove the clot. ? Place a filter in a large vein to catch blood clots before they  reach the lungs. Some treatments may be continued for up to six months. Follow these instructions at home: If you are taking blood thinners:  Take the medicine exactly as told by your health care provider. Some blood thinners need to be taken at the same time every day. Do not skip a dose.  Talk with your health care provider before you take any medicines that contain aspirin or NSAIDs. These medicines increase  your risk for dangerous bleeding.  Ask your health care provider about foods and drugs that could change the way the medicine works (may interact). Avoid those things if your health care provider tells you to do so.  Blood thinners can cause easy bruising and may make it difficult to stop bleeding. Because of this: ? Be very careful when using knives, scissors, or other sharp objects. ? Use an electric razor instead of a blade. ? Avoid activities that could cause injury or bruising, and follow instructions about how to prevent falls.  Wear a medical alert bracelet or carry a card that lists what medicines you take. General instructions  Take over-the-counter and prescription medicines only as told by your health care provider.  Return to your normal activities as told by your health care provider. Ask your health care provider what activities are safe for you.  Wear compression stockings if recommended by your health care provider.  Keep all follow-up visits as told by your health care provider. This is important. How is this prevented? To lower your risk of developing this condition again:  For 30 or more minutes every day, do an activity that: ? Involves moving your arms and legs. ? Increases your heart rate.  When traveling for longer than four hours: ? Exercise your arms and legs every hour. ? Drink plenty of water. ? Avoid drinking alcohol.  Avoid sitting or lying for a long time without moving your legs.  If you have surgery or you are hospitalized, ask about ways to prevent blood clots. These may include taking frequent walks or using anticoagulants.  Stay at a healthy weight.  If you are a woman who is older than age 71, avoid unnecessary use of medicines that contain estrogen, such as some birth control pills.  Do not use any products that contain nicotine or tobacco, such as cigarettes and e-cigarettes. This is especially important if you take estrogen medicines. If  you need help quitting, ask your health care provider. Contact a health care provider if:  You miss a dose of your blood thinner.  Your menstrual period is heavier than usual.  You have unusual bruising. Get help right away if:  You have: ? New or increased pain, swelling, or redness in an arm or leg. ? Numbness or tingling in an arm or leg. ? Shortness of breath. ? Chest pain. ? A rapid or irregular heartbeat. ? A severe headache or confusion. ? A cut that will not stop bleeding.  There is blood in your vomit, stool, or urine.  You have a serious fall or accident, or you hit your head.  You feel light-headed or dizzy.  You cough up blood. These symptoms may represent a serious problem that is an emergency. Do not wait to see if the symptoms will go away. Get medical help right away. Call your local emergency services (911 in the [place] Do not drive yourself to the hospital. Summary  Deep vein thrombosis (DVT) is a condition in which a blood clot forms in a deep vein,  such as a lower leg, thigh, or arm vein.  Symptoms can include swelling, warmth, pain, and redness in your leg or arm.  This condition may be treated with a blood thinner (anticoagulant medicine), medicine that is injected to dissolve blood clots,compression stockings, or surgery.  If you are prescribed blood thinners, take them exactly as told. This information is not intended to replace advice given to you by your health care provider. Make sure you discuss any questions you have with your health care provider. Document Released: 05/05/2005 Document Revised: 10/03/2016 Document Reviewed: 10/03/2016 Elsevier Interactive Patient Education  2019 [employer].
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
## 2018-05-25 NOTE — Progress Notes (Signed)
Bilateral lower extremity venous duplex completed. Patient is positive for acute deep venous thrombosis in the left lower extremity. See results under Chart Review- CV proc. Spoke with Loma Sousa, nurse at [facility], and patient was asked to be seen by the Doctor of the Day at [facility].

## 2018-05-31 ENCOUNTER — Telehealth: Payer: Self-pay | Admitting: Cardiovascular Disease

## 2018-05-31 NOTE — Telephone Encounter (Signed)
New message   Patient states that she is having issues with blood clots. Patient states that she is having swelling in left leg where she had a complete hip replacement. Please call to discuss.

## 2018-05-31 NOTE — Telephone Encounter (Signed)
Spoke with patient and she was in office last week positive for DVT. She has noticed that her leg is still swelling but swelling goes down. Swelling was not going down prior to starting Xarelto and leg not as tight feeling. Patient just wanted for Dr Claiborne Billings to know and get reassurance from him. Discussed with Dr Claiborne Billings and agreed this is reassuring and she is on the right regimen. Advised patient, verbalized understanding. She will continue to monitor

## 2018-06-07 ENCOUNTER — Telehealth: Payer: Self-pay | Admitting: Family Medicine

## 2018-06-07 NOTE — Telephone Encounter (Signed)
Copied from [place]. Topic: Appointment Scheduling - Scheduling Inquiry for Clinic >> Jun 07, 2018 12:20 PM Rayann Heman wrote: Reason for CRM: pt needing to schedule with [facility] on 06/08/18 at 1030. Thanks

## 2018-06-08 ENCOUNTER — Ambulatory Visit (HOSPITAL_BASED_OUTPATIENT_CLINIC_OR_DEPARTMENT_OTHER)
Admission: RE | Admit: 2018-06-08 | Discharge: 2018-06-08 | Disposition: A | Discharge status: Home / Self Care | Payer: Medicare Other | Source: Ambulatory Visit | Attending: Family Medicine | Admitting: Family Medicine

## 2018-06-08 ENCOUNTER — Encounter: Payer: Self-pay | Admitting: Family Medicine

## 2018-06-08 ENCOUNTER — Ambulatory Visit (INDEPENDENT_AMBULATORY_CARE_PROVIDER_SITE_OTHER): Payer: Medicare Other | Admitting: Family Medicine

## 2018-06-08 VITALS — BP 119/65 | HR 83 | Temp 98.3°F | Resp 16 | Ht 61.0 in | Wt 148.0 lb

## 2018-06-08 DIAGNOSIS — R05 Cough: Secondary | ICD-10-CM

## 2018-06-08 DIAGNOSIS — R059 Cough, unspecified: Secondary | ICD-10-CM

## 2018-06-08 DIAGNOSIS — J40 Bronchitis, not specified as acute or chronic: Secondary | ICD-10-CM

## 2018-06-08 MED ORDER — HYDROCODONE-HOMATROPINE 5-1.5 MG/5ML PO SYRP: 5.0000 mL | ORAL_SOLUTION | Freq: Every day | ORAL | 0 refills | Status: DC

## 2018-06-08 MED ORDER — CEFDINIR 300 MG PO CAPS: 300.0000 mg | ORAL_CAPSULE | Freq: Two times a day (BID) | ORAL | 0 refills | Status: DC
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
## 2018-06-08 NOTE — Patient Instructions (Signed)
Rest, hydrate.  mucinex (DM if cough) Hycodan cough syrup at night to hwlp with cough and allow you to sleep.  omnicef every 12 hours 10 days prescribed, take until completed.  cxr complete today at [facility] If cough present it can last up to 6-8 weeks.  F/U 2 weeks of not improved.    I am concerned you have pneumonia. Once we get the xray we will call you with further plan.     Community-Acquired Pneumonia, Adult Pneumonia is an infection of the lungs. It causes swelling in the airways of the lungs. Mucus and fluid may also build up inside the airways. One type of pneumonia can happen while a person is in a hospital. A different type can happen when a person is not in a hospital (community-acquired pneumonia).  What are the causes?  This condition is caused by germs (viruses, bacteria, or fungi). Some types of germs can be passed from one person to another. This can happen when you breathe in droplets from the cough or sneeze of an infected person. What increases the risk? You are more likely to develop this condition if you:  Have a long-term (chronic) disease, such as: ? Chronic obstructive pulmonary disease (COPD). ? Asthma. ? Cystic fibrosis. ? Congestive heart failure. ? Diabetes. ? Kidney disease.  Have HIV.  Have sickle cell disease.  Have had your spleen removed.  Do not take good care of your teeth and mouth (poor dental hygiene).  Have a medical condition that increases the risk of breathing in droplets from your own mouth and nose.  Have a weakened body defense system (immune system).  Are a smoker.  Travel to areas where the germs that cause this illness are common.  Are around certain animals or the places they live. What are the signs or symptoms?  A dry cough.  A wet (productive) cough.  Fever.  Sweating.  Chest pain. This often happens when breathing deeply or coughing.  Fast breathing or trouble breathing.  Shortness of  breath.  Shaking chills.  Feeling tired (fatigue).  Muscle aches. How is this treated? Treatment for this condition depends on many things. Most adults can be treated at home. In some cases, treatment must happen in a hospital. Treatment may include:  Medicines given by mouth or through an IV tube.  Being given extra oxygen.  Respiratory therapy. In rare cases, treatment for very bad pneumonia may include:  Using a machine to help you breathe.  Having a procedure to remove fluid from around your lungs. Follow these instructions at home: Medicines  Take over-the-counter and prescription medicines only as told by your doctor. ? Only take cough medicine if you are losing sleep.  If you were prescribed an antibiotic medicine, take it as told by your doctor. Do not stop taking the antibiotic even if you start to feel better. General instructions   Sleep with your head and neck raised (elevated). You can do this by sleeping in a recliner or by putting a few pillows under your head.  Rest as needed. Get at least 8 hours of sleep each night.  Drink enough water to keep your pee (urine) pale yellow.  Eat a healthy diet that includes plenty of vegetables, fruits, whole grains, low-fat dairy products, and lean protein.  Do not use any products that contain nicotine or tobacco. These include cigarettes, e-cigarettes, and chewing tobacco. If you need help quitting, ask your doctor.  Keep all follow-up visits as told by  your doctor. This is important. How is this prevented? A shot (vaccine) can help prevent pneumonia. Shots are often suggested for:  People older than 79 years of age.  People older than 79 years of age who: ? Are having cancer treatment. ? Have long-term (chronic) lung disease. ? Have problems with their body's defense system. You may also prevent pneumonia if you take these actions:  Get the flu (influenza) shot every year.  Go to the dentist as often as  told.  Wash your hands often. If you cannot use soap and water, use hand sanitizer. Contact a doctor if:  You have a fever.  You lose sleep because your cough medicine does not help. Get help right away if:  You are short of breath and it gets worse.  You have more chest pain.  Your sickness gets worse. This is very serious if: ? You are an older adult. ? Your body's defense system is weak.  You cough up blood. Summary  Pneumonia is an infection of the lungs.  Most adults can be treated at home. Some will need treatment in a hospital.  Drink enough water to keep your pee pale yellow.  Get at least 8 hours of sleep each night. This information is not intended to replace advice given to you by your health care provider. Make sure you discuss any questions you have with your health care provider. Document Released: 10/22/2007 Document Revised: 12/31/2017 Document Reviewed: 12/31/2017 Elsevier Interactive Patient Education  2019 [employer].

## 2018-06-24 ENCOUNTER — Encounter: Payer: Self-pay | Admitting: Physician Assistant

## 2018-06-24 ENCOUNTER — Other Ambulatory Visit: Payer: Self-pay | Admitting: *Deleted

## 2018-06-24 ENCOUNTER — Ambulatory Visit (INDEPENDENT_AMBULATORY_CARE_PROVIDER_SITE_OTHER): Payer: Medicare Other | Admitting: Physician Assistant

## 2018-06-24 VITALS — BP 126/88 | HR 72 | Ht 61.0 in | Wt 149.0 lb

## 2018-06-24 DIAGNOSIS — I10 Essential (primary) hypertension: Secondary | ICD-10-CM

## 2018-06-24 DIAGNOSIS — I824Y2 Acute embolism and thrombosis of unspecified deep veins of left proximal lower extremity: Secondary | ICD-10-CM | POA: Diagnosis not present

## 2018-06-24 DIAGNOSIS — R0600 Dyspnea, unspecified: Secondary | ICD-10-CM | POA: Diagnosis not present

## 2018-06-24 MED ORDER — RIVAROXABAN 20 MG PO TABS: 20.0000 mg | ORAL_TABLET | Freq: Every day | ORAL | 0 refills | Status: DC
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
## 2018-06-24 NOTE — Patient Instructions (Signed)
Medication Instructions:   Your physician recommends that you continue on your current medications as directed. Please refer to the Current Medication list given to you today.  If you need a refill on your cardiac medications before your next appointment, please call your pharmacy.   Lab work:  NONE  If you have labs (blood work) drawn today and your tests are completely normal, you will receive your results only by: Marland Kitchen MyChart Message (if you have MyChart) OR . A paper copy in the mail If you have any lab test that is abnormal or we need to change your treatment, we will call you to review the results.  Testing/Procedures:  NONE  Follow-Up:  Your physician recommends that you follow up as schedule

## 2018-06-26 ENCOUNTER — Encounter: Payer: Self-pay | Admitting: Physician Assistant

## 2018-06-29 ENCOUNTER — Ambulatory Visit (INDEPENDENT_AMBULATORY_CARE_PROVIDER_SITE_OTHER): Payer: Medicare Other | Admitting: Family Medicine

## 2018-06-29 ENCOUNTER — Encounter: Payer: Self-pay | Admitting: Family Medicine

## 2018-06-29 VITALS — BP 135/82 | HR 80 | Temp 97.5°F | Resp 16 | Ht 61.0 in | Wt 145.0 lb

## 2018-06-29 DIAGNOSIS — Z7901 Long term (current) use of anticoagulants: Secondary | ICD-10-CM | POA: Diagnosis not present

## 2018-06-29 DIAGNOSIS — R059 Cough, unspecified: Secondary | ICD-10-CM

## 2018-06-29 DIAGNOSIS — I824Y2 Acute embolism and thrombosis of unspecified deep veins of left proximal lower extremity: Secondary | ICD-10-CM

## 2018-06-29 DIAGNOSIS — I10 Essential (primary) hypertension: Secondary | ICD-10-CM

## 2018-06-29 DIAGNOSIS — R42 Dizziness and giddiness: Secondary | ICD-10-CM | POA: Diagnosis not present

## 2018-06-29 DIAGNOSIS — R05 Cough: Secondary | ICD-10-CM | POA: Diagnosis not present

## 2018-06-29 DIAGNOSIS — R9389 Abnormal findings on diagnostic imaging of other specified body structures: Secondary | ICD-10-CM | POA: Diagnosis not present

## 2018-06-29 LAB — TSH: TSH: 2.51 u[IU]/mL (ref 0.35–4.50)

## 2018-06-29 LAB — COMPREHENSIVE METABOLIC PANEL
ALBUMIN: 4.5 g/dL (ref 3.5–5.2)
ALT: 13 U/L (ref 0–35)
AST: 18 U/L (ref 0–37)
Alkaline Phosphatase: 91 U/L (ref 39–117)
BUN: 19 mg/dL (ref 6–23)
CHLORIDE: 100 meq/L (ref 96–112)
CO2: 29 meq/L (ref 19–32)
CREATININE: 0.78 mg/dL (ref 0.40–1.20)
Calcium: 10.6 mg/dL — ABNORMAL HIGH (ref 8.4–10.5)
GFR: 71.24 mL/min (ref >60.00)
GLUCOSE: 102 mg/dL — AB (ref 70–99)
Potassium: 4.9 mEq/L (ref 3.5–5.1)
SODIUM: 139 meq/L (ref 135–145)
Total Bilirubin: 0.5 mg/dL (ref 0.2–1.2)
Total Protein: 7.8 g/dL (ref 6.0–8.3)

## 2018-06-29 LAB — CBC
HEMATOCRIT: 41.9 % (ref 36.0–46.0)
Hemoglobin: 13.8 g/dL (ref 12.0–15.0)
MCHC: 32.8 g/dL (ref 30.0–36.0)
MCV: 89.9 fl (ref 78.0–100.0)
Platelets: 381 10*3/uL (ref 150.0–400.0)
RBC: 4.66 Mil/uL (ref 3.87–5.11)
RDW: 15.3 % (ref 11.5–15.5)
WBC: 8.3 10*3/uL (ref 4.0–10.5)
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
## 2018-06-29 NOTE — Patient Instructions (Signed)
Shortness of breath, fever, worsening cough, worsening dizziness, or heart racing /palpitations --> go to ED immediately. Otherwise we will plan on outpatient chest CT to further evaluate your lungs.     Pulmonary Embolism  A pulmonary embolism (PE) is a sudden blockage or decrease of blood flow in one lung or both lungs. Most blockages come from a blood clot that forms in a lower leg, thigh, or arm vein (deep vein thrombosis, DVT) and travels to the lungs. A clot is blood that has thickened into a gel or solid. PE is a dangerous and life-threatening condition that needs to be treated right away. What are the causes? This condition is usually caused by a blood clot that forms in a vein and moves to the lungs. In rare cases, it may be caused by air, fat, part of a tumor, or other tissue that moves through the veins and into the lungs. What increases the risk? The following factors may make you more likely to develop this condition:  Traumatic injury, such as breaking a hip or leg.  Spinal cord injury.  Orthopedic surgery, especially hip or knee replacement.  Any major surgery.  Stroke.  Having DVT.  Blood clots or blood clotting disease.  Long-term (chronic) lung or heart disease.  Taking medicines that contain estrogen. These include birth control pills and hormone replacement therapy.  Cancer and chemotherapy.  Having a central venous catheter.  Pregnancy and the period of time after delivery (postpartum).  Being older than age 17.  Being overweight.  Smoking. What are the signs or symptoms? Symptoms of this condition usually start suddenly and include:  Shortness of breath during activity or at rest.  Coughing or coughing up blood or blood-tinged mucus.  Chest pain that is often worse with deep breaths.  Rapid or irregular heartbeat.  Feeling light-headed or dizzy.  Fainting.  Feeling anxious.  Fever.  Sweating.  Pain and swelling in a leg. This is a  symptom of DVT, which can lead to PE. How is this diagnosed? This condition may be diagnosed based on:  Your medical history.  A physical exam.  Blood tests.  CT pulmonary angiogram. This test checks blood flow in and around your lungs.  Ventilation-perfusion scan, also called a lung VQ scan. This test measures air flow and blood flow to the lungs.  Ultrasound of the legs. How is this treated? Treatment for this condition depends on many factors, such as the cause of your PE, your risk for bleeding or developing more clots, and other medical conditions you have. Treatment aims to remove, dissolve, or stop blood clots from forming or growing larger. Treatment may include:  Medicines, such as: ? Blood thinning medicines (anticoagulants) to stop clots from forming or growing. ? Medicines that dissolve clots (thrombolytics).  Procedures, such as: ? Using a flexible tube to remove a blood clot (embolectomy) or deliver medicine to destroy it (catheter-directed thrombolysis). ? Inserting a filter into a large vein that carries blood to the heart (inferior vena cava). This filter (vena cava filter) catches blood clots before they reach the lungs. ? Surgery to remove the clot (surgical embolectomy). This is rare. You may need a combination of immediate, long-term (up to 3 months after diagnosis), and extended (more than 3 months after diagnosis) treatments. Your treatment may continue for several months (maintenance therapy). You and your health care provider will work together to choose the treatment program that is best for you. Follow these instructions at home: Medicines  Take over-the-counter and prescription medicines only as told by your health care provider.  If you are taking an anticoagulant medicine: ? Take the medicine every day at the same time each day. ? Understand what foods and drugs interact with your medicine. ? Understand the side effects of this medicine, including  excessive bruising or bleeding. Ask your health care provider or pharmacist about other side effects. General instructions  Wear a medical alert bracelet or carry a medical alert card that says you have had a PE and lists what medicines you take.  Ask your health care provider when you may return to your normal activities. Avoid sitting or lying for a long time without moving.  Maintain a healthy weight. Ask your health care provider what weight is healthy for you.  Do not use any products that contain nicotine or tobacco, such as cigarettes and e-cigarettes. If you need help quitting, ask your health care provider.  Talk with your health care provider about any travel plans. It is important to make sure that you are still able to take your medicine while on trips.  Keep all follow-up visits as told by your health care provider. This is important. Contact a health care provider if:  You missed a dose of your blood thinner medicine. Get help right away if:  You have: ? New or increased pain, swelling, warmth, or redness in an arm or leg. ? Numbness or tingling in an arm or leg. ? Shortness of breath during activity or at rest. ? A fever. ? Chest pain. ? A rapid or irregular heartbeat. ? A severe headache. ? Vision changes. ? A serious fall or accident, or you hit your head. ? Stomach (abdominal) pain. ? Blood in your vomit, stool, or urine. ? A cut that will not stop bleeding.  You cough up blood.  You feel light-headed or dizzy.  You cannot move your arms or legs.  You are confused or have memory loss. These symptoms may represent a serious problem that is an emergency. Do not wait to see if the symptoms will go away. Get medical help right away. Call your local emergency services (911 in the [place] Do not drive yourself to the hospital. Summary  A pulmonary embolism (PE) is a sudden blockage or decrease of blood flow in one lung or both lungs. PE is a dangerous and  life-threatening condition that needs to be treated right away.  Treatments for this condition usually include medicines to thin your blood (anticoagulants) or medicines to break apart blood clots (thrombolytics).  If you are given blood thinners, it is important to take the medicine every single day at the same time each day.  If you have signs of PE or DVT, call your local emergency services (911 in the [place] This information is not intended to replace advice given to you by your health care provider. Make sure you discuss any questions you have with your health care provider. Document Released: 05/02/2000 Document Revised: 12/18/2017 Document Reviewed: 06/18/2017 Elsevier Interactive Patient Education  2019 [employer].

## 2018-06-30 ENCOUNTER — Ambulatory Visit (HOSPITAL_BASED_OUTPATIENT_CLINIC_OR_DEPARTMENT_OTHER)
Admission: RE | Admit: 2018-06-30 | Discharge: 2018-06-30 | Disposition: A | Discharge status: Home / Self Care | Payer: Medicare Other | Source: Ambulatory Visit | Attending: Family Medicine | Admitting: Family Medicine

## 2018-06-30 ENCOUNTER — Encounter (HOSPITAL_BASED_OUTPATIENT_CLINIC_OR_DEPARTMENT_OTHER): Payer: Self-pay

## 2018-06-30 ENCOUNTER — Telehealth: Payer: Self-pay | Admitting: Family Medicine

## 2018-06-30 ENCOUNTER — Encounter: Payer: Self-pay | Admitting: Family Medicine

## 2018-06-30 DIAGNOSIS — I700 Atherosclerosis of aorta: Secondary | ICD-10-CM

## 2018-06-30 DIAGNOSIS — I824Y2 Acute embolism and thrombosis of unspecified deep veins of left proximal lower extremity: Secondary | ICD-10-CM | POA: Insufficient documentation

## 2018-06-30 DIAGNOSIS — I2510 Atherosclerotic heart disease of native coronary artery without angina pectoris: Secondary | ICD-10-CM

## 2018-06-30 DIAGNOSIS — R9389 Abnormal findings on diagnostic imaging of other specified body structures: Secondary | ICD-10-CM | POA: Diagnosis not present

## 2018-06-30 DIAGNOSIS — R42 Dizziness and giddiness: Secondary | ICD-10-CM | POA: Diagnosis not present

## 2018-06-30 DIAGNOSIS — I2699 Other pulmonary embolism without acute cor pulmonale: Secondary | ICD-10-CM

## 2018-06-30 DIAGNOSIS — R05 Cough: Secondary | ICD-10-CM | POA: Diagnosis not present

## 2018-06-30 DIAGNOSIS — R059 Cough, unspecified: Secondary | ICD-10-CM

## 2018-06-30 DIAGNOSIS — R918 Other nonspecific abnormal finding of lung field: Secondary | ICD-10-CM

## 2018-06-30 DIAGNOSIS — R7989 Other specified abnormal findings of blood chemistry: Secondary | ICD-10-CM | POA: Diagnosis not present

## 2018-06-30 MED ORDER — MECLIZINE HCL 25 MG PO TABS: 25.0000 mg | ORAL_TABLET | Freq: Three times a day (TID) | ORAL | 1 refills | Status: DC | PRN

## 2018-06-30 MED ORDER — IOPAMIDOL (ISOVUE-370) INJECTION 76%
100.0000 mL | Freq: Once | INTRAVENOUS | Status: AC | PRN
  Administered 2018-06-30: 100 mL via INTRAVENOUS

## 2018-06-30 NOTE — Telephone Encounter (Signed)
Called patient today to discuss lab work and CT angiogram.  She does have evidence of small PE right upper lobe.  She is already on Xarelto.  She also has evidence of potential interstitial lung disease changes with patchy areas of groundglass attenuation septal thickening of her bilateral lungs.  SHe does endorse her dizziness/vertigo has continued, otherwise she feels well.  She was neurologically intact on exam.  She denies headache or other neurological manifestations since her visit. -Trial of Antivert prescribed, with strict precautions for her to follow-up in 1 week if not improving, or emergently if worsening or other neurological manifestations occur.  She reports understanding. Possible need of brain image if worsening.  - Her VSS and oxygenation as been WNL. ? Xarelto side effect since dose is now 20 mg ? (2%)  -Referral to pulmonology placed.--evaluation  for interstitial lung disease, follow along for PE.  She has also had mild hypercalcemia off-and-on over the last year.  Calcium again mildly elevated at 10.6. CBC was normal. TSH and glucose normal.

## 2018-07-06 ENCOUNTER — Institutional Professional Consult (permissible substitution): Payer: Medicare Other | Admitting: Pulmonary Disease

## 2018-07-06 NOTE — Telephone Encounter (Signed)
Pt called to follow up on why she had not heard anything about an appt for this issue.  Pt did have an appt today at 10:30, which she knew nothing about. Pt states she did not get a voice mail, and had been checking the phone closely. I called pulmonary and got her another appt, on Monday, 2/24 at 11:15 am. Pt states she can make that appt and will be there.

## 2018-07-06 NOTE — Telephone Encounter (Signed)
Called and spoke with pt. Appt is scheduled and pt verbalizes understanding on date and time

## 2018-07-06 NOTE — Telephone Encounter (Signed)
Message is left by the pulm office, not this office. Please make her aware.

## 2018-07-07 ENCOUNTER — Other Ambulatory Visit: Payer: Self-pay | Admitting: Cardiovascular Disease

## 2018-07-12 ENCOUNTER — Ambulatory Visit (INDEPENDENT_AMBULATORY_CARE_PROVIDER_SITE_OTHER): Payer: Medicare Other | Admitting: Pulmonary Disease

## 2018-07-12 ENCOUNTER — Encounter: Payer: Self-pay | Admitting: Pulmonary Disease

## 2018-07-12 VITALS — BP 132/76 | HR 88 | Ht 61.0 in | Wt 142.4 lb

## 2018-07-12 DIAGNOSIS — R0602 Shortness of breath: Secondary | ICD-10-CM | POA: Diagnosis not present

## 2018-07-12 DIAGNOSIS — J849 Interstitial pulmonary disease, unspecified: Secondary | ICD-10-CM

## 2018-07-12 LAB — CBC WITH DIFFERENTIAL/PLATELET
Basophils Absolute: 0.1 10*3/uL (ref 0.0–0.1)
Basophils Relative: 1.2 % (ref 0.0–3.0)
Eosinophils Absolute: 0.7 10*3/uL (ref 0.0–0.7)
Eosinophils Relative: 6.9 % — ABNORMAL HIGH (ref 0.0–5.0)
HCT: 42.7 % (ref 36.0–46.0)
Hemoglobin: 14.1 g/dL (ref 12.0–15.0)
LYMPHS ABS: 3.6 10*3/uL (ref 0.7–4.0)
Lymphocytes Relative: 37.5 % (ref 12.0–46.0)
MCHC: 33.1 g/dL (ref 30.0–36.0)
MCV: 89.3 fl (ref 78.0–100.0)
Monocytes Absolute: 0.8 10*3/uL (ref 0.1–1.0)
Monocytes Relative: 8.6 % (ref 3.0–12.0)
Neutro Abs: 4.4 10*3/uL (ref 1.4–7.7)
Neutrophils Relative %: 45.8 % (ref 43.0–77.0)
Platelets: 331 10*3/uL (ref 150.0–400.0)
RBC: 4.78 Mil/uL (ref 3.87–5.11)
RDW: 14.3 % (ref 11.5–15.5)
WBC: 9.7 10*3/uL (ref 4.0–10.5)
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
## 2018-07-12 NOTE — Patient Instructions (Addendum)
I have reviewed your CT scan which shows some changes in the lung that could be indicative of inflammation and scarring We will evaluate this further with a high-resolution CT, pulmonary function test We will send some labs today  Follow-up in 2 to 4 weeks for review of these tests and plan for next steps.

## 2018-07-14 LAB — ANA,IFA RA DIAG PNL W/RFLX TIT/PATN
Anti Nuclear Antibody(ANA): NEGATIVE
Cyclic Citrullin Peptide Ab: <16 UNITS
RHEUMATOID FACTOR: 27 [IU]/mL — AB (ref <14)

## 2018-07-14 LAB — SJOGREN'S SYNDROME ANTIBODS(SSA + SSB)
SSA (Ro) (ENA) Antibody, IgG: <1 AI
SSB (La) (ENA) Antibody, IgG: <1 AI

## 2018-07-14 LAB — IGE: IgE (Immunoglobulin E), Serum: 50 kU/L (ref <=114)

## 2018-07-14 LAB — ANTI-SCLERODERMA ANTIBODY: Scleroderma (Scl-70) (ENA) Antibody, IgG: <1 AI

## 2018-07-14 LAB — ANCA SCREEN W REFLEX TITER: ANCA Screen: NEGATIVE

## 2018-07-16 ENCOUNTER — Telehealth: Payer: Self-pay

## 2018-07-16 NOTE — Telephone Encounter (Signed)
Refill request sent from [facility] for   Vit D 50,000 units, 1 capsule q 7days   LOV 06/29/2018  NOV Not one scheduled  Last refill 01/13/2018 See phone note/labs from 01/13/2018- You said only to take for 12 weeks.   Please advise

## 2018-07-19 ENCOUNTER — Other Ambulatory Visit: Payer: Self-pay | Admitting: Pulmonary Disease

## 2018-07-19 ENCOUNTER — Ambulatory Visit (HOSPITAL_COMMUNITY): Payer: Medicare Other

## 2018-07-19 ENCOUNTER — Ambulatory Visit (HOSPITAL_BASED_OUTPATIENT_CLINIC_OR_DEPARTMENT_OTHER)
Admission: RE | Admit: 2018-07-19 | Discharge: 2018-07-19 | Disposition: A | Discharge status: Home / Self Care | Payer: Medicare Other | Source: Ambulatory Visit | Attending: Pulmonary Disease | Admitting: Pulmonary Disease

## 2018-07-19 DIAGNOSIS — J8410 Pulmonary fibrosis, unspecified: Secondary | ICD-10-CM | POA: Diagnosis not present

## 2018-07-19 DIAGNOSIS — J849 Interstitial pulmonary disease, unspecified: Secondary | ICD-10-CM

## 2018-07-19 NOTE — Telephone Encounter (Signed)
Pt was informed to take the 1,000u of vitamin D daily with meals, pt verbalized understanding and has been taking that since running out of the once weekly med. She states she has been going to pulm MD and has a lot of appts and tests the next few weeks so will call to schedule F/U appt after those appts

## 2018-07-19 NOTE — Telephone Encounter (Signed)
Please inform patient the following information: Received refill request for high dose vit d 50000u. Please advise pt she should start an OTC vit D 1000u Qd with meals. She should no longer need the high dose- we will recheck this level at her next appt to make sure she is maintaining her levels now that is supplemented.

## 2018-07-20 ENCOUNTER — Ambulatory Visit (INDEPENDENT_AMBULATORY_CARE_PROVIDER_SITE_OTHER): Payer: Medicare Other | Admitting: Pulmonary Disease

## 2018-07-20 DIAGNOSIS — R0602 Shortness of breath: Secondary | ICD-10-CM

## 2018-07-20 LAB — PULMONARY FUNCTION TEST
DL/VA % pred: 184 %
DL/VA: 7.56 ml/min/mmHg/L
DLCO UNC: 13.86 ml/min/mmHg
DLCO cor % pred: 134 %
DLCO cor: 24.42 ml/min/mmHg
DLCO unc % pred: 76 %
FEF 25-75 Post: 3.51 L/sec
FEF 25-75 Pre: 3.74 L/sec
FEF2575-%Change-Post: -6 %
FEF2575-%Pred-Post: 250 %
FEF2575-%Pred-Pre: 266 %
FEV1-%Change-Post: 2 %
FEV1-%Pred-Post: 103 %
FEV1-%Pred-Pre: 100 %
FEV1-Post: 1.95 L
FEV1-Pre: 1.89 L
FEV1FVC-%Change-Post: 0 %
FEV1FVC-%Pred-Pre: 123 %
FEV6-%Change-Post: 1 %
FEV6-%Pred-Post: 88 %
FEV6-%Pred-Pre: 86 %
FEV6-Post: 2.11 L
FEV6-Pre: 2.07 L
FEV6FVC-%CHANGE-POST: 0 %
FEV6FVC-%Pred-Post: 105 %
FEV6FVC-%Pred-Pre: 105 %
FVC-%Change-Post: 1 %
FVC-%Pred-Post: 83 %
FVC-%Pred-Pre: 82 %
FVC-Post: 2.12 L
FVC-Pre: 2.08 L
PRE FEV1/FVC RATIO: 91 %
Post FEV1/FVC ratio: 92 %
Post FEV6/FVC ratio: 100 %
Pre FEV6/FVC Ratio: 100 %
RV % pred: 63 %
RV: 1.47 L
TLC % pred: 73 %
TLC: 3.62 L

## 2018-07-20 LAB — MYOSITIS PANEL III: RNP: 2.9 EU/ml

## 2018-07-20 LAB — HYPERSENSITIVITY PNEUMONITIS
A. FUMIGATUS #1 ABS: NEGATIVE
A. Pullulans Abs: NEGATIVE
Micropolyspora faeni, IgG: NEGATIVE
Pigeon Serum Abs: NEGATIVE
Thermoact. Saccharii: NEGATIVE
Thermoactinomyces vulgaris, IgG: NEGATIVE

## 2018-07-20 NOTE — Progress Notes (Signed)
Full PFT performed today. °

## 2018-07-27 ENCOUNTER — Ambulatory Visit (INDEPENDENT_AMBULATORY_CARE_PROVIDER_SITE_OTHER): Payer: Medicare Other | Admitting: Pulmonary Disease

## 2018-07-27 ENCOUNTER — Encounter: Payer: Self-pay | Admitting: Pulmonary Disease

## 2018-07-27 VITALS — BP 136/78 | HR 74 | Ht 61.0 in | Wt 144.2 lb

## 2018-07-27 DIAGNOSIS — J849 Interstitial pulmonary disease, unspecified: Secondary | ICD-10-CM | POA: Diagnosis not present

## 2018-07-27 DIAGNOSIS — I2510 Atherosclerotic heart disease of native coronary artery without angina pectoris: Secondary | ICD-10-CM | POA: Diagnosis not present

## 2018-07-27 MED ORDER — RIVAROXABAN 20 MG PO TABS: 20.0000 mg | ORAL_TABLET | Freq: Every day | ORAL | 0 refills | Status: DC

## 2018-07-27 MED ORDER — RIVAROXABAN 15 MG PO TABS: 15.0000 mg | ORAL_TABLET | Freq: Two times a day (BID) | ORAL | 0 refills | Status: DC

## 2018-07-27 NOTE — Progress Notes (Signed)
Angela Preston    [id number]    [birth year]  Primary [facility], Reinaldo Raddle, DO  Referring Physician: Ma Hillock, DO [address] [place], [place] [address]  Chief complaint: Consult for pulmonary embolism, interstitial lung disease  HPI: 78 year old with history of Takotsubo cardiomyopathy, hyperlipidemia, hypertension, recent PE, DVT  Underwent left hip arthroplasty September 2019 and susequentlly developed left leg swelling which worsened.  A lower extremity Doppler on 1/7 showed DVT and placed on Xarelto.  Had a primary care visit with cough, chest congestion.  Noted to have bilateral crackles.  Was treated with Omnicef and Hycodan cough syrup but continues to have persistent symptoms. A CTA obtained this month showed a small nonocclusive thrombus in the right upper lobe and changes concerning for interstitial lung disease and she has been referred to pulmonary for further evaluation.  Chief complaint today is cough, nonproductive in nature.  Mild dyspnea on exertion.  No wheezing, fevers, chills.  She has occasional dysphagia, food getting stuck on swallowing.  Also complains of dry mouth, dry eyes and acid reflux.  Pets: Outside cat, no dogs, birds, farm animals Occupation: Lawyer for [employer], worked as a Insurance claims handler.  Currently retired. Exposures: No known exposures, no mold, hot tub, Jacuzzi, humidifier Smoking history: Never smoker Travel history: No significant travel history Relevant family history: Brothers and father have emphysema, COPD.  They are all smokers.  Her younger brother is currently hospitalized with spontaneous pneumothorax.  Father had lung issues.  Unclear if he had fibrosis.  Interim history: She is here for follow-up after CT scan, serologies and PFTs States that breathing is doing well with no issues  Continues on Xarelto for DVT.  She is worried about the cost of the medication.  Outpatient Encounter Medications as of  07/27/2018  Medication Sig  . cholecalciferol (VITAMIN D3) 25 MCG (1000 UT) tablet Take 1,000 Units by mouth daily.  Marland Kitchen ezetimibe (ZETIA) 10 MG tablet TAKE 1 TABLET BY MOUTH EVERY DAY  . meclizine (ANTIVERT) 25 MG tablet Take 1 tablet (25 mg total) by mouth 3 (three) times daily as needed for dizziness.  . Multiple Vitamins-Minerals (MULTIVITAMIN WITH MINERALS) tablet Take 1 tablet by mouth daily with lunch.   . Omega-3 Fatty Acids (FISH OIL) 1000 MG CAPS Take 3,000 mg by mouth daily with lunch.  . polyethylene glycol (MIRALAX / GLYCOLAX) packet Take 17 g by mouth 2 (two) times daily.  . rivaroxaban (XARELTO) 20 MG TABS tablet Take 1 tablet (20 mg total) by mouth daily with supper.  . triamterene-hydrochlorothiazide (MAXZIDE) 75-50 MG tablet Take 1 tablet by mouth every other day. (Patient taking differently: Take 1 tablet by mouth daily. )   No facility-administered encounter medications on file as of 07/27/2018.    Physical Exam: Blood pressure 136/78, pulse 74, height 5\' 1"  (1.549 m), weight 144 lb 3.2 oz (65.4 kg), SpO2 96 %. Gen:      No acute distress HEENT:  EOMI, sclera anicteric Neck:     No masses; no thyromegaly Lungs:    Clear to auscultation bilaterally; normal respiratory effort CV:         Regular rate and rhythm; no murmurs Abd:      + bowel sounds; soft, non-tender; no palpable masses, no distension Ext:    No edema; adequate peripheral perfusion Skin:      Warm and dry; no rash Neuro: alert and oriented x 3 Psych: normal mood and  affect   Data Reviewed: Imaging: Lower extremity ultrasound 05/26/2018- acute DVT in the left leg  CTA 06/30/2018- small nonocclusive pulmonary embolism in the right upper lobe, aortic, coronary atherosclerosis, patchy areas of peripheral groundglass and septal thickening bilaterally. I have reviewed the images personally.  High-resolution CT 07/19/2018- peripheral and basilar predominant subpleural groundglass, reticulation, traction  bronchiectasis.  No honeycombing.   PFTs 07/20/2018 FVC 2.12 [83%], FEV1 1.95 [103%), F/F 92, TLC 73%, DLCO corrected 134% Minimal restriction, increased diffusion defect.  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  Clinically she is asymtomatic Discussed further work-up including lung biopsy or bronchoscopy with envisia classifier  She is on Xarelto for DVT we will hold off on any procedure for now. Reevaluate in 3 months with spirometry, diffusion capacity and discuss biopsy at that point.  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  Anticoagulation was started in January 2020.  Would recommend 6 months of therapy.  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  Plan/Recommendations: - Follow-up in 3 months with spirometry, diffusion capacity - Continue Xarelto for 3 more months.  [name] MD [facility] 07/27/2018, 10:20 AM  CC: Kuneff, Renee A, DO

## 2018-07-27 NOTE — Patient Instructions (Signed)
I have reviewed your scans and lung function test which shows interstitial lung disease of unclear etiology We will continue to monitor this Follow-up in 3 months with spirometry and diffusion capacity. Based on these results we may need to decide if we want to go ahead with a lung biopsy

## 2018-08-12 ENCOUNTER — Telehealth: Payer: Self-pay | Admitting: Cardiovascular Disease

## 2018-08-12 NOTE — Telephone Encounter (Signed)
Called patient regarding this.  Will route to triage to monitor.

## 2018-08-12 NOTE — Telephone Encounter (Signed)
New message  Pt c/o medication issue:  1. Name of Medication: xarelto  2. How are you currently taking this medication (dosage and times per day)?1 time daily   3. Are you having a reaction (difficulty breathing--STAT)? No   4. What is your medication issue? Patient states that this medication is expensive and wants to see what her options are.

## 2018-08-12 NOTE — Telephone Encounter (Signed)
Per Hao's last OV note. We should try Eliquis 5mg  twice daily.  We can provide 30day free card.

## 2018-08-12 NOTE — Telephone Encounter (Signed)
Please advise. Thanks.  

## 2018-08-13 MED ORDER — APIXABAN 5 MG PO TABS: 5.0000 mg | ORAL_TABLET | Freq: Two times a day (BID) | ORAL | 5 refills | Status: DC

## 2018-08-13 NOTE — Telephone Encounter (Signed)
Follow up  Re-routed call

## 2018-08-13 NOTE — Telephone Encounter (Signed)
PT called back returning Julie's call

## 2018-08-13 NOTE — Telephone Encounter (Signed)
Spoke with the pt. Pt sts that she is unable to afford Xarelto. Per [name], PA she can be switched to Eliquis 5mg  bid. Pt sts that she took her last tablet yesterday and she is currently out of her medication. Adv her that I will mail her a 30 day free card for Eliquis. This may take a couple of days to get to her. Adv her to contact her pharmacy and ask to purchase 5-7 days of Xarelto to carry her until she received the Eliquis card in the mail. Contacted [facility] and spoke with the Pharmacist who sts that she has sn active Rx for Xarelto and that would be fine.  Adv the pt that there should be no interruption in therapy. She should take her last dose of Xarelto when her supply is complete and start Elquis 5mg  twice daily the next day. Pt verbalized understanding to the instructions given and voiced appreciation for the assistance.

## 2018-08-18 ENCOUNTER — Ambulatory Visit (HOSPITAL_BASED_OUTPATIENT_CLINIC_OR_DEPARTMENT_OTHER): Payer: Medicare Other

## 2018-08-20 NOTE — Telephone Encounter (Addendum)
° ° °  Patient states she did not receive the correct information in the mail regarding Eliquis. Patient states there was not a coupon enclosed. She has been without medication for 2 days. She is at [facility] currently.  Please call  Patient at [phone]

## 2018-08-20 NOTE — Telephone Encounter (Signed)
Returned call to patient she stated she cannot afford Eliquis or Xarelto.Stated she received a card in mail to help, but pharmacist told her it was just a information card not a discount card.Stated she has not taken Eliquis in the past 2 days.Our office is out of samples.Advised to buy 5 tablets.I will send message to pharmacy for advice on changing to coumadin.

## 2018-08-20 NOTE — Telephone Encounter (Signed)
Dr. Oval Linsey Patient appears to have provoked DVT post hip surgery. DVT diagnosed Jan 7.  If we're just doing 3 months anticoagulation, she should be good with just a few more days of therapy.  Is there any concern for her needing to continue DOAC's long term?

## 2018-08-23 NOTE — Telephone Encounter (Signed)
I agree.  Three months of anticoagulation is sufficient.

## 2018-08-25 NOTE — Telephone Encounter (Signed)
Returned call to patient.  She is aware to stop medication at 3 months.  DVT diagnosed on 05/25/2018.  She has 1 tablet remaining.  Advised she does not need to worry about refilling the medication.  Patient voiced understanding.

## 2018-09-13 ENCOUNTER — Telehealth: Payer: Self-pay | Admitting: Cardiovascular Disease

## 2018-09-13 NOTE — Telephone Encounter (Signed)
Virtual Visit Pre-Appointment Phone Call  "(Name), I am calling you today to discuss your upcoming appointment. We are currently trying to limit exposure to the virus that causes COVID-19 by seeing patients at home rather than in the office."  1. "What is the BEST phone number to call the day of the visit?" - include this in appointment notes  2. Do you have or have access to (through a family member/friend) a smartphone with video capability that we can use for your visit?" a. If yes - list this number in appt notes as cell (if different from BEST phone #) and list the appointment type as a VIDEO visit in appointment notes b. If no - list the appointment type as a PHONE visit in appointment notes  3. Confirm consent - "In the setting of the current Covid19 crisis, you are scheduled for a (phone or video) visit with your provider on (date) at (time).  Just as we do with many in-office visits, in order for you to participate in this visit, we must obtain consent.  If you'd like, I can send this to your mychart (if signed up) or email for you to review.  Otherwise, I can obtain your verbal consent now.  All virtual visits are billed to your insurance company just like a normal visit would be.  By agreeing to a virtual visit, we'd like you to understand that the technology does not allow for your provider to perform an examination, and thus may limit your provider's ability to fully assess your condition. If your provider identifies any concerns that need to be evaluated in person, we will make arrangements to do so.  Finally, though the technology is pretty good, we cannot assure that it will always work on either your or our end, and in the setting of a video visit, we may have to convert it to a phone-only visit.  In either situation, we cannot ensure that we have a secure connection.  Are you willing to proceed?" STAFF: Did the patient verbally acknowledge consent to telehealth visit? Document  YES/NO here: Yes  4. Advise patient to be prepared - "Two hours prior to your appointment, go ahead and check your blood pressure, pulse, oxygen saturation, and your weight (if you have the equipment to check those) and write them all down. When your visit starts, your provider will ask you for this information. If you have an Apple Watch or Kardia device, please plan to have heart rate information ready on the day of your appointment. Please have a pen and paper handy nearby the day of the visit as well."  5. Give patient instructions for MyChart download to smartphone OR Doximity/Doxy.me as below if video visit (depending on what platform provider is using)  6. Inform patient they will receive a phone call 15 minutes prior to their appointment time (may be from unknown caller ID) so they should be prepared to answer    TELEPHONE CALL NOTE  Angela Preston has been deemed a candidate for a follow-up tele-health visit to limit community exposure during the Covid-19 pandemic. I spoke with the patient via phone to ensure availability of phone/video source, confirm preferred email & phone number, and discuss instructions and expectations.  I reminded Angela Preston to be prepared with any vital sign and/or heart rhythm information that could potentially be obtained via home monitoring, at the time of her visit. I reminded Angela Preston to expect a phone call prior to  her visit.  [name] 09/13/2018 1:17 PM      FULL LENGTH CONSENT FOR TELE-HEALTH VISIT   I hereby voluntarily request, consent and authorize [facility] and its employed or contracted physicians, physician assistants, nurse practitioners or other licensed health care professionals (the Practitioner), to provide me with telemedicine health care services (the Services") as deemed necessary by the treating Practitioner. I acknowledge and consent to receive the Services by the Practitioner via telemedicine. I understand  that the telemedicine visit will involve communicating with the Practitioner through live audiovisual communication technology and the disclosure of certain medical information by electronic transmission. I acknowledge that I have been given the opportunity to request an in-person assessment or other available alternative prior to the telemedicine visit and am voluntarily participating in the telemedicine visit.  I understand that I have the right to withhold or withdraw my consent to the use of telemedicine in the course of my care at any time, without affecting my right to future care or treatment, and that the Practitioner or I may terminate the telemedicine visit at any time. I understand that I have the right to inspect all information obtained and/or recorded in the course of the telemedicine visit and may receive copies of available information for a reasonable fee.  I understand that some of the potential risks of receiving the Services via telemedicine include:   Delay or interruption in medical evaluation due to technological equipment failure or disruption;  Information transmitted may not be sufficient (e.g. poor resolution of images) to allow for appropriate medical decision making by the Practitioner; and/or   In rare instances, security protocols could fail, causing a breach of personal health information.  Furthermore, I acknowledge that it is my responsibility to provide information about my medical history, conditions and care that is complete and accurate to the best of my ability. I acknowledge that Practitioner's advice, recommendations, and/or decision may be based on factors not within their control, such as incomplete or inaccurate data provided by me or distortions of diagnostic images or specimens that may result from electronic transmissions. I understand that the practice of medicine is not an exact science and that Practitioner makes no warranties or guarantees regarding  treatment outcomes. I acknowledge that I will receive a copy of this consent concurrently upon execution via email to the email address I last provided but may also request a printed copy by calling the office of [place].    I understand that my insurance will be billed for this visit.   I have read or had this consent read to me.  I understand the contents of this consent, which adequately explains the benefits and risks of the Services being provided via telemedicine.   I have been provided ample opportunity to ask questions regarding this consent and the Services and have had my questions answered to my satisfaction.  I give my informed consent for the services to be provided through the use of telemedicine in my medical care  By participating in this telemedicine visit I agree to the above.

## 2018-09-13 NOTE — Telephone Encounter (Signed)
Home phone/ consent/ my chart/ pre completed

## 2018-09-14 ENCOUNTER — Telehealth (INDEPENDENT_AMBULATORY_CARE_PROVIDER_SITE_OTHER): Payer: Medicare Other | Admitting: Cardiovascular Disease

## 2018-09-14 ENCOUNTER — Encounter: Payer: Self-pay | Admitting: Cardiovascular Disease

## 2018-09-14 VITALS — BP 118/68 | HR 75 | Ht 61.0 in | Wt 140.0 lb

## 2018-09-14 DIAGNOSIS — E785 Hyperlipidemia, unspecified: Secondary | ICD-10-CM

## 2018-09-14 DIAGNOSIS — I5181 Takotsubo syndrome: Secondary | ICD-10-CM | POA: Diagnosis not present

## 2018-09-14 DIAGNOSIS — Z86718 Personal history of other venous thrombosis and embolism: Secondary | ICD-10-CM | POA: Diagnosis not present

## 2018-09-14 DIAGNOSIS — J849 Interstitial pulmonary disease, unspecified: Secondary | ICD-10-CM | POA: Diagnosis not present

## 2018-09-14 DIAGNOSIS — I10 Essential (primary) hypertension: Secondary | ICD-10-CM | POA: Diagnosis not present

## 2018-09-14 DIAGNOSIS — I700 Atherosclerosis of aorta: Secondary | ICD-10-CM
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
## 2018-09-14 NOTE — Patient Instructions (Signed)
Medication Instructions:  The current medical regimen is effective;  continue present plan and medications.  If you need a refill on your cardiac medications before your next appointment, please call your pharmacy.   Lab work: CMET, LIPID before follow up appointment in 4 months.   If you have labs (blood work) drawn today and your tests are completely normal, you will receive your results only by: Marland Kitchen MyChart Message (if you have MyChart) OR . A paper copy in the mail If you have any lab test that is abnormal or we need to change your treatment, we will call you to review the results.  Follow-Up: At [facility], you and your health needs are our priority.  As part of our continuing mission to provide you with exceptional heart care, we have created designated Provider Care Teams.  These Care Teams include your primary Cardiologist (physician) and Advanced Practice Providers (APPs -  Physician Assistants and Nurse Practitioners) who all work together to provide you with the care you need, when you need it. You will need a follow up appointment in 4 months. You may see Shelva Majestic, MD or one of the following Advanced Practice Providers on your designated Care Team: [place], [place] . Fabian Sharp, PA-C

## 2018-10-08 ENCOUNTER — Ambulatory Visit (HOSPITAL_BASED_OUTPATIENT_CLINIC_OR_DEPARTMENT_OTHER)
Admission: RE | Admit: 2018-10-08 | Discharge: 2018-10-08 | Disposition: A | Discharge status: Home / Self Care | Payer: Medicare Other | Source: Ambulatory Visit | Attending: Family Medicine | Admitting: Family Medicine

## 2018-10-08 ENCOUNTER — Other Ambulatory Visit: Payer: Self-pay

## 2018-10-08 DIAGNOSIS — Z1239 Encounter for other screening for malignant neoplasm of breast: Secondary | ICD-10-CM

## 2018-10-08 DIAGNOSIS — Z1231 Encounter for screening mammogram for malignant neoplasm of breast: Secondary | ICD-10-CM | POA: Diagnosis not present

## 2018-10-21 ENCOUNTER — Other Ambulatory Visit: Payer: Self-pay | Admitting: Cardiovascular Disease

## 2018-10-21 MED ORDER — TRIAMTERENE-HCTZ 75-50 MG PO TABS: 1.0000 | ORAL_TABLET | Freq: Every day | ORAL | 1 refills | Status: DC

## 2018-10-21 NOTE — Telephone Encounter (Signed)
°*  STAT* If patient is at the pharmacy, call can be transferred to refill team.   1. Which medications need to be refilled? (please list name of each medication and dose if known) Triameterene HCTZ 75/50 mg  2. Which pharmacy/location (including street and city if local pharmacy) is  medication to be sent to? [facility]   3. Do they need a 30 day or 90 day supply?  [place]

## 2018-10-26 ENCOUNTER — Ambulatory Visit: Payer: Medicare Other | Admitting: Pulmonary Disease

## 2018-10-27 DIAGNOSIS — H40033 Anatomical narrow angle, bilateral: Secondary | ICD-10-CM | POA: Diagnosis not present

## 2018-10-27 DIAGNOSIS — H2513 Age-related nuclear cataract, bilateral: Secondary | ICD-10-CM | POA: Diagnosis not present

## 2018-11-02 ENCOUNTER — Ambulatory Visit: Payer: Medicare Other | Admitting: Cardiovascular Disease

## 2018-11-05 ENCOUNTER — Other Ambulatory Visit: Payer: Self-pay | Admitting: Pulmonary Disease

## 2018-11-12 ENCOUNTER — Other Ambulatory Visit (HOSPITAL_COMMUNITY)
Admission: RE | Admit: 2018-11-12 | Discharge: 2018-11-12 | Disposition: A | Discharge status: Home / Self Care | Payer: Medicare Other | Source: Ambulatory Visit | Attending: Pulmonary Disease | Admitting: Pulmonary Disease

## 2018-11-12 ENCOUNTER — Telehealth: Payer: Self-pay | Admitting: Physician Assistant

## 2018-11-12 DIAGNOSIS — Z1159 Encounter for screening for other viral diseases: Secondary | ICD-10-CM | POA: Insufficient documentation

## 2018-11-12 LAB — SARS CORONAVIRUS 2 (TAT 6-24 HRS): SARS Coronavirus 2: NEGATIVE

## 2018-11-12 NOTE — Telephone Encounter (Signed)
Patient wants to discuss swelling,will call triage / home phone/ consent/ my chart declined/ pre reg completed

## 2018-11-15 ENCOUNTER — Ambulatory Visit (INDEPENDENT_AMBULATORY_CARE_PROVIDER_SITE_OTHER): Payer: Medicare Other | Admitting: Pulmonary Disease

## 2018-11-15 ENCOUNTER — Ambulatory Visit (INDEPENDENT_AMBULATORY_CARE_PROVIDER_SITE_OTHER): Payer: Medicare Other | Admitting: Nurse Practitioner

## 2018-11-15 ENCOUNTER — Telehealth: Payer: Medicare Other | Admitting: Physician Assistant

## 2018-11-15 ENCOUNTER — Other Ambulatory Visit: Payer: Self-pay

## 2018-11-15 ENCOUNTER — Encounter: Payer: Self-pay | Admitting: Nurse Practitioner

## 2018-11-15 VITALS — BP 124/76 | HR 75 | Temp 98.0°F | Ht 61.0 in | Wt 145.4 lb

## 2018-11-15 DIAGNOSIS — J849 Interstitial pulmonary disease, unspecified: Secondary | ICD-10-CM | POA: Diagnosis not present

## 2018-11-15 LAB — PULMONARY FUNCTION TEST
DL/VA % pred: 100 %
DL/VA: 4.14 ml/min/mmHg/L
DLCO unc % pred: 70 %
DLCO unc: 12.86 ml/min/mmHg
FEF 25-75 Pre: 4.19 L/sec
FEF2575-%Pred-Pre: 298 %
FEV1-%Pred-Pre: 104 %
FEV1-Pre: 1.96 L
FEV1FVC-%Pred-Pre: 125 %
FEV6-%Pred-Pre: 88 %
FEV6-Pre: 2.12 L
FEV6FVC-%Pred-Pre: 105 %
FVC-%Pred-Pre: 83 %
FVC-Pre: 2.12 L
Pre FEV1/FVC ratio: 93 %
Pre FEV6/FVC Ratio: 100 %

## 2018-11-15 NOTE — Progress Notes (Signed)
Spirometry and Dlco done today. 

## 2018-11-15 NOTE — Assessment & Plan Note (Signed)
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  Clinically she is asymtomatic. Discussed further work-up including lung biopsy or bronchoscopy with envisia classifier with Dr. Vaughan Browner.    Reevaluate in 6 months with spirometry, diffusion capacity and follow up HRCT.  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   Patient Instructions  Will order follow up CT in 6 months Will order PFT in 6 months   Follow up: Follow up with Dr. Vaughan Browner in 6 months with PFT the same day. Follow up sooner if symptoms worsen
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
## 2018-11-15 NOTE — Patient Instructions (Signed)
Will order follow up CT in 6 months Will order PFT in 6 months   Follow up: Follow up with Dr. Vaughan Browner in 6 months with PFT the same day. Follow up sooner if symptoms worsen

## 2018-11-17 ENCOUNTER — Telehealth (INDEPENDENT_AMBULATORY_CARE_PROVIDER_SITE_OTHER): Payer: Medicare Other | Admitting: Physician Assistant

## 2018-11-17 VITALS — BP 124/76 | HR 75 | Ht 61.0 in | Wt 141.0 lb

## 2018-11-17 DIAGNOSIS — I10 Essential (primary) hypertension: Secondary | ICD-10-CM

## 2018-11-17 DIAGNOSIS — R600 Localized edema: Secondary | ICD-10-CM

## 2018-11-17 DIAGNOSIS — I5181 Takotsubo syndrome: Secondary | ICD-10-CM

## 2018-11-17 DIAGNOSIS — Z86718 Personal history of other venous thrombosis and embolism: Secondary | ICD-10-CM

## 2018-11-17 DIAGNOSIS — J849 Interstitial pulmonary disease, unspecified: Secondary | ICD-10-CM

## 2018-11-17 MED ORDER — FUROSEMIDE 20 MG PO TABS: 20.0000 mg | ORAL_TABLET | Freq: Every day | ORAL | 3 refills | Status: DC

## 2018-11-17 NOTE — Patient Instructions (Signed)
Medication Instructions:    START LASIX 20 MG Daily for 3 days, after 3 days take LASIX 20 MG as needed for increased leg swelling If you need a refill on your cardiac medications before your next appointment, please call your pharmacy.   Lab work:  NONE ordered at this time of appointment   If you have labs (blood work) drawn today and your tests are completely normal, you will receive your results only by: Marland Kitchen MyChart Message (if you have MyChart) OR . A paper copy in the mail If you have any lab test that is abnormal or we need to change your treatment, we will call you to review the results.  Testing/Procedures:  NONE ordered at this time of appointment   Follow-Up: At [facility], you and your health needs are our priority.  As part of our continuing mission to provide you with exceptional heart care, we have created designated Provider Care Teams.  These Care Teams include your primary Cardiologist (physician) and Advanced Practice Providers (APPs -  Physician Assistants and Nurse Practitioners) who all work together to provide you with the care you need, when you need it. . Your physician recommends that you schedule a follow-up appointment in: 2-3 weeks with Angela Deforest, PA-C  Any Other Special Instructions Will Be Listed Below (If Applicable).
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
## 2018-12-02 ENCOUNTER — Telehealth: Payer: Self-pay | Admitting: Physician Assistant

## 2018-12-02 NOTE — Telephone Encounter (Signed)

## 2018-12-03 ENCOUNTER — Ambulatory Visit (INDEPENDENT_AMBULATORY_CARE_PROVIDER_SITE_OTHER): Payer: Medicare Other | Admitting: Physician Assistant

## 2018-12-03 ENCOUNTER — Other Ambulatory Visit: Payer: Self-pay

## 2018-12-03 ENCOUNTER — Encounter: Payer: Self-pay | Admitting: Physician Assistant

## 2018-12-03 VITALS — BP 134/72 | HR 63 | Ht 61.0 in | Wt 143.2 lb

## 2018-12-03 DIAGNOSIS — I5181 Takotsubo syndrome: Secondary | ICD-10-CM

## 2018-12-03 DIAGNOSIS — I2510 Atherosclerotic heart disease of native coronary artery without angina pectoris: Secondary | ICD-10-CM

## 2018-12-03 DIAGNOSIS — R600 Localized edema: Secondary | ICD-10-CM

## 2018-12-03 DIAGNOSIS — I10 Essential (primary) hypertension: Secondary | ICD-10-CM | POA: Diagnosis not present

## 2018-12-03 DIAGNOSIS — J849 Interstitial pulmonary disease, unspecified: Secondary | ICD-10-CM

## 2018-12-03 NOTE — Patient Instructions (Signed)
Medication Instructions:  Your physician recommends that you continue on your current medications as directed. Please refer to the Current Medication list given to you today.  If you need a refill on your cardiac medications before your next appointment, please call your pharmacy.   Lab work: You will need to have labs (blood work) drawn today:  BMET  If you have labs (blood work) drawn today and your tests are completely normal, you will receive your results only by: Marland Kitchen MyChart Message (if you have MyChart) OR . A paper copy in the mail If you have any lab test that is abnormal or we need to change your treatment, we will call you to review the results.  Testing/Procedures: NONE ordered at this time of appointment   Follow-Up: At [facility], you and your health needs are our priority.  As part of our continuing mission to provide you with exceptional heart care, we have created designated Provider Care Teams.  These Care Teams include your primary Cardiologist (physician) and Advanced Practice Providers (APPs -  Physician Assistants and Nurse Practitioners) who all work together to provide you with the care you need, when you need it. You will need a follow up appointment in 6 months.  Please call our office 2 months in advance to schedule this appointment.  You may see Shelva Majestic, MD or one of the following Advanced Practice Providers on your designated Care Team: [place], [place] . Fabian Sharp, PA-C  Any Other Special Instructions Will Be Listed Below (If Applicable).
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
## 2018-12-04 LAB — BASIC METABOLIC PANEL
BUN/Creatinine Ratio: 30 — ABNORMAL HIGH (ref 12–28)
BUN: 24 mg/dL (ref 8–27)
CO2: 25 mmol/L (ref 20–29)
Calcium: 10.1 mg/dL (ref 8.7–10.3)
Chloride: 98 mmol/L (ref 96–106)
Creatinine, Ser: 0.8 mg/dL (ref 0.57–1.00)
GFR calc Af Amer: 81 mL/min/{1.73_m2} (ref >59)
GFR calc non Af Amer: 70 mL/min/{1.73_m2} (ref >59)
Glucose: 93 mg/dL (ref 65–99)
Potassium: 3.9 mmol/L (ref 3.5–5.2)
Sodium: 141 mmol/L (ref 134–144)

## 2018-12-05 ENCOUNTER — Encounter: Payer: Self-pay | Admitting: Physician Assistant

## 2018-12-05 NOTE — Progress Notes (Signed)
Renal function and electrolyte normal.

## 2018-12-06 NOTE — Progress Notes (Signed)
The patient has been notified of the result and verbalized understanding.  All questions (if any) were answered. Angela Preston, [place] 12/06/2018 4:52 PM

## 2018-12-07 DIAGNOSIS — H35372 Puckering of macula, left eye: Secondary | ICD-10-CM | POA: Diagnosis not present

## 2018-12-07 DIAGNOSIS — H43812 Vitreous degeneration, left eye: Secondary | ICD-10-CM | POA: Diagnosis not present

## 2018-12-07 DIAGNOSIS — H35371 Puckering of macula, right eye: Secondary | ICD-10-CM | POA: Diagnosis not present

## 2018-12-07 DIAGNOSIS — H43811 Vitreous degeneration, right eye: Secondary | ICD-10-CM | POA: Diagnosis not present

## 2018-12-27 DIAGNOSIS — H43811 Vitreous degeneration, right eye: Secondary | ICD-10-CM | POA: Diagnosis not present

## 2018-12-27 DIAGNOSIS — H35371 Puckering of macula, right eye: Secondary | ICD-10-CM | POA: Diagnosis not present

## 2018-12-27 DIAGNOSIS — Z961 Presence of intraocular lens: Secondary | ICD-10-CM | POA: Diagnosis not present

## 2019-02-17 DIAGNOSIS — M25561 Pain in right knee: Secondary | ICD-10-CM | POA: Diagnosis not present

## 2019-02-17 DIAGNOSIS — M1711 Unilateral primary osteoarthritis, right knee: Secondary | ICD-10-CM | POA: Diagnosis not present

## 2019-02-23 ENCOUNTER — Telehealth: Payer: Self-pay | Admitting: *Deleted

## 2019-02-23 ENCOUNTER — Telehealth: Payer: Self-pay

## 2019-02-23 NOTE — Telephone Encounter (Signed)
Received Pre op risk eval for for surgery clearance. Pt is having R total knee arthroplasty on 03/29/2019 by Dr Alvan Dame. Pt was called and message was left to return call to schedule appt with Dr Raoul Pitch.   Paperwork on my desk.

## 2019-02-23 NOTE — Telephone Encounter (Signed)
   [facility] Pre-operative Risk Assessment    Request for surgical clearance:  1. What type of surgery is being performed? RIGHT TOTAL KNEE ARTHROPLASTY   2. When is this surgery scheduled? 03/29/19   3. What type of clearance is required (medical clearance vs. Pharmacy clearance to hold med vs. Both)? MEDICAL  4. Are there any medications that need to be held prior to surgery and how long? NONE LISTED    5. Practice name and name of physician performing surgery? [facility]; DR. Bedford   6. What is your office phone number [phone]    7.   What is your office fax number [phone]  8.   Anesthesia type (None, local, MAC, general) ? SPINAL   Angela Preston 02/23/2019, 12:02 PM  _________________________________________________________________   (provider comments below)

## 2019-02-23 NOTE — Telephone Encounter (Signed)
Patient scheduled an in person visit for 03/01/19.

## 2019-02-23 NOTE — Telephone Encounter (Signed)
   Primary Cardiologist: Shelva Majestic, MD  Chart reviewed as part of pre-operative protocol coverage. Given past medical history and time since last visit, based on ACC/AHA guidelines, Gara Wahls Picazo would be at acceptable risk for the planned procedure without further cardiovascular testing.   She was last seen by [name], PA-C 12/03/2018 in follow up and was doing quite well from a cardiac perspective.   I will route this recommendation to the requesting party via Epic fax function and remove from pre-op pool.  Please call with questions.  [name], NP 02/23/2019, 12:24 PM

## 2019-03-01 ENCOUNTER — Encounter: Payer: Self-pay | Admitting: Family Medicine

## 2019-03-01 ENCOUNTER — Ambulatory Visit (INDEPENDENT_AMBULATORY_CARE_PROVIDER_SITE_OTHER): Payer: Medicare Other | Admitting: Family Medicine

## 2019-03-01 ENCOUNTER — Other Ambulatory Visit: Payer: Self-pay

## 2019-03-01 VITALS — BP 136/84 | HR 78 | Temp 97.7°F | Resp 17 | Ht 61.0 in | Wt 142.4 lb

## 2019-03-01 DIAGNOSIS — I82442 Acute embolism and thrombosis of left tibial vein: Secondary | ICD-10-CM

## 2019-03-01 DIAGNOSIS — E785 Hyperlipidemia, unspecified: Secondary | ICD-10-CM

## 2019-03-01 DIAGNOSIS — Z01818 Encounter for other preprocedural examination: Secondary | ICD-10-CM

## 2019-03-01 DIAGNOSIS — E663 Overweight: Secondary | ICD-10-CM

## 2019-03-01 DIAGNOSIS — I10 Essential (primary) hypertension: Secondary | ICD-10-CM | POA: Diagnosis not present

## 2019-03-01 DIAGNOSIS — R7309 Other abnormal glucose: Secondary | ICD-10-CM

## 2019-03-01 DIAGNOSIS — N183 Chronic kidney disease, stage 3 unspecified: Secondary | ICD-10-CM | POA: Diagnosis not present

## 2019-03-01 NOTE — Patient Instructions (Signed)
We will call you with lab results and fax clearance to surgeon office after results   Good luck!
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
## 2019-03-02 ENCOUNTER — Telehealth: Payer: Self-pay

## 2019-03-02 DIAGNOSIS — N183 Chronic kidney disease, stage 3 unspecified: Secondary | ICD-10-CM

## 2019-03-02 LAB — CBC
HCT: 40.6 % (ref 36.0–46.0)
Hemoglobin: 13.8 g/dL (ref 12.0–15.0)
MCHC: 33.9 g/dL (ref 30.0–36.0)
MCV: 92.3 fl (ref 78.0–100.0)
Platelets: 312 10*3/uL (ref 150.0–400.0)
RBC: 4.4 Mil/uL (ref 3.87–5.11)
RDW: 12.6 % (ref 11.5–15.5)
WBC: 8.6 10*3/uL (ref 4.0–10.5)

## 2019-03-02 LAB — HEMOGLOBIN A1C: Hgb A1c MFr Bld: 5.9 % (ref 4.6–6.5)

## 2019-03-02 NOTE — Telephone Encounter (Signed)
[facility] called and stated patient had critical lab: Potasium- 8.4, per lab did look hemolyzed and suggest it be redrawn.   Per Dr Raoul Pitch- Have patient come tomorrow to have redrawn. Pt was called and scheduled for tomorrow morning. Future lab placed for BMP.

## 2019-03-03 ENCOUNTER — Other Ambulatory Visit: Payer: Self-pay

## 2019-03-03 ENCOUNTER — Ambulatory Visit (INDEPENDENT_AMBULATORY_CARE_PROVIDER_SITE_OTHER): Payer: Medicare Other | Admitting: Family Medicine

## 2019-03-03 DIAGNOSIS — N183 Chronic kidney disease, stage 3 unspecified: Secondary | ICD-10-CM

## 2019-03-03 LAB — BASIC METABOLIC PANEL
BUN: 22 mg/dL (ref 6–23)
CO2: 30 mEq/L (ref 19–32)
Calcium: 9.8 mg/dL (ref 8.4–10.5)
Chloride: 101 mEq/L (ref 96–112)
Creatinine, Ser: 0.9 mg/dL (ref 0.40–1.20)
GFR: 60.29 mL/min (ref >60.00)
Glucose, Bld: 97 mg/dL (ref 70–99)
Potassium: 4.2 mEq/L (ref 3.5–5.1)
Sodium: 139 mEq/L (ref 135–145)

## 2019-03-04 ENCOUNTER — Encounter: Payer: Self-pay | Admitting: Family Medicine

## 2019-03-04 ENCOUNTER — Telehealth: Payer: Self-pay | Admitting: Family Medicine

## 2019-03-04 DIAGNOSIS — I2699 Other pulmonary embolism without acute cor pulmonale: Secondary | ICD-10-CM | POA: Insufficient documentation

## 2019-03-04 DIAGNOSIS — Z86711 Personal history of pulmonary embolism: Secondary | ICD-10-CM | POA: Insufficient documentation

## 2019-03-04 LAB — URINALYSIS W MICROSCOPIC + REFLEX CULTURE
Bilirubin Urine: NEGATIVE
Glucose, UA: NEGATIVE
Hgb urine dipstick: NEGATIVE
Hyaline Cast: NONE SEEN /LPF
Ketones, ur: NEGATIVE
Nitrites, Initial: NEGATIVE
Protein, ur: NEGATIVE
Specific Gravity, Urine: 1.014 (ref 1.001–1.03)
pH: 7.5 (ref 5.0–8.0)

## 2019-03-04 LAB — URINE CULTURE
MICRO NUMBER:: 986097
SPECIMEN QUALITY:: ADEQUATE

## 2019-03-04 LAB — CULTURE INDICATED

## 2019-03-04 MED ORDER — NITROFURANTOIN MONOHYD MACRO 100 MG PO CAPS: 100.0000 mg | ORAL_CAPSULE | Freq: Two times a day (BID) | ORAL | 0 refills | Status: DC

## 2019-03-04 NOTE — Telephone Encounter (Signed)
Please inform patient the following information: Her labs are all stable.  With the exception of her urine culture again came back positive for infection with E. Coli. I called in an abx that is twice a day for 7 days.  We will complete the surgical clearance and fax it over to her ortho by Monday.

## 2019-03-04 NOTE — Telephone Encounter (Signed)
Pt was called and VM was left with lab results and information.

## 2019-03-04 NOTE — Telephone Encounter (Signed)
Calling for test results from yesterday.  Please call [phone]

## 2019-03-04 NOTE — Telephone Encounter (Signed)
Pt was called and given lab results, see lab result note

## 2019-03-07 NOTE — Telephone Encounter (Signed)
Pt was called and given information, surgical clearance was faxed with OV notes and labs. Pt aware she will need clearance from cardio and Pulm.

## 2019-03-08 ENCOUNTER — Telehealth: Payer: Self-pay | Admitting: Pulmonary Disease

## 2019-03-08 NOTE — Telephone Encounter (Signed)
Called and spoke with Patient.  Patient stated she was told by Dr.Olin's office to contact [facility], to let Dr.Mannam know they were faxing a surgical clearance request this morning. Patient request someone call her once Dr. Vaughan Browner has received and completed surgical clearance.  Will route message to [place] to follow up

## 2019-03-08 NOTE — Telephone Encounter (Signed)
Called and spoke with Patient. Patient stated she was told to contact [facility] today.  Patient stated Dr. Alvan Dame is sending a surgical clearance fa

## 2019-03-09 DIAGNOSIS — M25561 Pain in right knee: Secondary | ICD-10-CM | POA: Diagnosis not present

## 2019-03-09 DIAGNOSIS — M1711 Unilateral primary osteoarthritis, right knee: Secondary | ICD-10-CM | POA: Diagnosis not present

## 2019-03-09 DIAGNOSIS — M25661 Stiffness of right knee, not elsewhere classified: Secondary | ICD-10-CM | POA: Diagnosis not present

## 2019-03-09 NOTE — Telephone Encounter (Signed)
We have received the fax for clearance and I have placed it for Dr. Vaughan Browner to sign.

## 2019-03-11 NOTE — Telephone Encounter (Signed)
Cleared for surgery.  She is at low to moderate risk given interstitial lung disease, pulmonary fibrosis. Advised strict DVT prophylaxis as she has incidence of DVT/PE with orthopedic procedures in the past Will need early mobilization, incentive spirometer We will fax the papers.  She will need follow up in pulmonary clinic with high res CT, spirometry and diffusion capacity in dec 2020. Please schedule

## 2019-03-11 NOTE — Telephone Encounter (Signed)
Pt calling back regarding surgical clearance form for knee surgery scheduled 11/10.  Please advise. [phone] or [phone].

## 2019-03-11 NOTE — Telephone Encounter (Signed)
Fax number to send form is [phone]

## 2019-03-11 NOTE — Telephone Encounter (Signed)
I have received surgical clearance back signed by Dr. Vaughan Browner and it will be faxed today. Nothing further is needed.

## 2019-03-22 NOTE — Patient Instructions (Signed)
DUE TO COVID-19 ONLY ONE VISITOR IS ALLOWED TO COME WITH YOU AND STAY IN THE WAITING ROOM ONLY DURING PRE OP AND PROCEDURE DAY OF SURGERY. THE 1 VISITOR MAY VISIT WITH YOU AFTER SURGERY IN YOUR PRIVATE ROOM DURING VISITING HOURS ONLY!  YOU NEED TO HAVE A COVID 19 TEST ON_Friday 11/06/2020______ @_______ , THIS TEST MUST BE DONE BEFORE SURGERY, COME  [address] , [id number].  ([place]) ONCE YOUR COVID TEST IS COMPLETED, PLEASE BEGIN THE QUARANTINE INSTRUCTIONS AS OUTLINED IN YOUR HANDOUT.                Angela Preston     Your procedure is scheduled on: Tuesday 03/29/2019   Report to [facility] Main  Entrance    Report to admitting at    0735 AM     Call this number if you have problems the morning of surgery [phone]    Remember: Do not eat food  :After Midnight.   NO SOLID FOOD AFTER MIDNIGHT THE NIGHT PRIOR TO SURGERY. NOTHING BY MOUTH EXCEPT CLEAR LIQUIDS UNTIL  0705 am .    PLEASE FINISH ENSURE DRINK PER SURGEON ORDER  WHICH NEEDS TO BE COMPLETED AT   0705 am.   CLEAR LIQUID DIET   Foods Allowed                                                                     Foods Excluded  Coffee and tea, regular and decaf                             liquids that you cannot  Plain Jell-O any favor except red or purple                                           see through such as: Fruit ices (not with fruit pulp)                                     milk, soups, orange juice  Iced Popsicles                                    All solid food Carbonated beverages, regular and diet                                    Cranberry, grape and apple juices Sports drinks like Gatorade Lightly seasoned clear broth or consume(fat free) Sugar, honey syrup  Sample Menu Breakfast                                Lunch  Supper Cranberry juice                    Beef broth                            Chicken broth Jell-O                                      Grape juice                           Apple juice Coffee or tea                        Jell-O                                      Popsicle                                                Coffee or tea                        Coffee or tea  _____________________________________________________________________      BRUSH YOUR TEETH MORNING OF SURGERY AND RINSE YOUR MOUTH OUT, NO CHEWING GUM CANDY OR MINTS.     Take these medicines the morning of surgery with A SIP OF WATER: use eye drops                                 You may not have any metal on your body including hair pins and              piercings  Do not wear jewelry, make-up, lotions, powders or perfumes, deodorant             Do not wear nail polish on your fingernails.  Do not shave  48 hours prior to surgery.                 Do not bring valuables to the hospital. [place].  Contacts, dentures or bridgework may not be worn into surgery.  Leave suitcase in the car. After surgery it may be brought to your room.                Please read over the following fact sheets you were given: _____________________________________________________________________             [facility] - Preparing for Surgery Before surgery, you can play an important role.  Because skin is not sterile, your skin needs to be as free of germs as possible.  You can reduce the number of germs on your skin by washing with CHG (chlorahexidine gluconate) soap before surgery.  CHG is an antiseptic cleaner which kills germs and bonds with the skin to continue killing germs even after washing. Please DO NOT use if you have an allergy to CHG or antibacterial soaps.  If your skin becomes reddened/irritated stop using the CHG and inform your nurse when you arrive at Short Stay. Do not shave (including legs and underarms) for at least 48 hours prior to the first CHG shower.  You  may shave your face/neck. Please follow these instructions carefully:  1.  Shower with CHG Soap the night before surgery and the  morning of Surgery.  2.  If you choose to wash your hair, wash your hair first as usual with your  normal  shampoo.  3.  After you shampoo, rinse your hair and body thoroughly to remove the  shampoo.                           4.  Use CHG as you would any other liquid soap.  You can apply chg directly  to the skin and wash                       Gently with a scrungie or clean washcloth.  5.  Apply the CHG Soap to your body ONLY FROM THE NECK DOWN.   Do not use on face/ open                           Wound or open sores. Avoid contact with eyes, ears mouth and genitals (private parts).                       Wash face,  Genitals (private parts) with your normal soap.             6.  Wash thoroughly, paying special attention to the area where your surgery  will be performed.  7.  Thoroughly rinse your body with warm water from the neck down.  8.  DO NOT shower/wash with your normal soap after using and rinsing off  the CHG Soap.                9.  Pat yourself dry with a clean towel.            10.  Wear clean pajamas.            11.  Place clean sheets on your bed the night of your first shower and do not  sleep with pets. Day of Surgery : Do not apply any lotions/deodorants the morning of surgery.  Please wear clean clothes to the hospital/surgery center.  FAILURE TO FOLLOW THESE INSTRUCTIONS MAY RESULT IN THE CANCELLATION OF YOUR SURGERY PATIENT SIGNATURE_________________________________  NURSE SIGNATURE__________________________________  ________________________________________________________________________   Angela Preston  An incentive spirometer is a tool that can help keep your lungs clear and active. This tool measures how well you are filling your lungs with each breath. Taking long deep breaths may help reverse or decrease the chance of  developing breathing (pulmonary) problems (especially infection) following:  A long period of time when you are unable to move or be active. BEFORE THE PROCEDURE   If the spirometer includes an indicator to show your best effort, your nurse or respiratory therapist will set it to a desired goal.  If possible, sit up straight or lean slightly forward. Try not to slouch.  Hold the incentive spirometer in an upright position. INSTRUCTIONS FOR USE  1. Sit on the edge of your bed if possible, or sit up as far as you can in bed or on  a chair. 2. Hold the incentive spirometer in an upright position. 3. Breathe out normally. 4. Place the mouthpiece in your mouth and seal your lips tightly around it. 5. Breathe in slowly and as deeply as possible, raising the piston or the ball toward the top of the column. 6. Hold your breath for 3-5 seconds or for as long as possible. Allow the piston or ball to fall to the bottom of the column. 7. Remove the mouthpiece from your mouth and breathe out normally. 8. Rest for a few seconds and repeat Steps 1 through 7 at least 10 times every 1-2 hours when you are awake. Take your time and take a few normal breaths between deep breaths. 9. The spirometer may include an indicator to show your best effort. Use the indicator as a goal to work toward during each repetition. 10. After each set of 10 deep breaths, practice coughing to be sure your lungs are clear. If you have an incision (the cut made at the time of surgery), support your incision when coughing by placing a pillow or rolled up towels firmly against it. Once you are able to get out of bed, walk around indoors and cough well. You may stop using the incentive spirometer when instructed by your caregiver.  RISKS AND COMPLICATIONS  Take your time so you do not get dizzy or light-headed.  If you are in pain, you may need to take or ask for pain medication before doing incentive spirometry. It is harder to take a  deep breath if you are having pain. AFTER USE  Rest and breathe slowly and easily.  It can be helpful to keep track of a log of your progress. Your caregiver can provide you with a simple table to help with this. If you are using the spirometer at home, follow these instructions: [place] IF:   You are having difficultly using the spirometer.  You have trouble using the spirometer as often as instructed.  Your pain medication is not giving enough relief while using the spirometer.  You develop fever of 100.5 F (38.1 C) or higher. SEEK IMMEDIATE MEDICAL CARE IF:   You cough up bloody sputum that had not been present before.  You develop fever of 102 F (38.9 C) or greater.  You develop worsening pain at or near the incision site. MAKE SURE YOU:   Understand these instructions.  Will watch your condition.  Will get help right away if you are not doing well or get worse. Document Released: 09/15/2006 Document Revised: 07/28/2011 Document Reviewed: 11/16/2006 ExitCare Patient Information 2014 ExitCare, [place].   ________________________________________________________________________  WHAT IS A BLOOD TRANSFUSION? Blood Transfusion Information  A transfusion is the replacement of blood or some of its parts. Blood is made up of multiple cells which provide different functions.  Red blood cells carry oxygen and are used for blood loss replacement.  White blood cells fight against infection.  Platelets control bleeding.  Plasma helps clot blood.  Other blood products are available for specialized needs, such as hemophilia or other clotting disorders. BEFORE THE TRANSFUSION  Who gives blood for transfusions?   Healthy volunteers who are fully evaluated to make sure their blood is safe. This is blood bank blood. Transfusion therapy is the safest it has ever been in the practice of medicine. Before blood is taken from a donor, a complete history is taken to make  sure that person has no history of diseases nor engages in risky social behavior (  examples are intravenous drug use or sexual activity with multiple partners). The donor's travel history is screened to minimize risk of transmitting infections, such as malaria. The donated blood is tested for signs of infectious diseases, such as HIV and hepatitis. The blood is then tested to be sure it is compatible with you in order to minimize the chance of a transfusion reaction. If you or a relative donates blood, this is often done in anticipation of surgery and is not appropriate for emergency situations. It takes many days to process the donated blood. RISKS AND COMPLICATIONS Although transfusion therapy is very safe and saves many lives, the main dangers of transfusion include:   Getting an infectious disease.  Developing a transfusion reaction. This is an allergic reaction to something in the blood you were given. Every precaution is taken to prevent this. The decision to have a blood transfusion has been considered carefully by your caregiver before blood is given. Blood is not given unless the benefits outweigh the risks. AFTER THE TRANSFUSION  Right after receiving a blood transfusion, you will usually feel much better and more energetic. This is especially true if your red blood cells have gotten low (anemic). The transfusion raises the level of the red blood cells which carry oxygen, and this usually causes an energy increase.  The nurse administering the transfusion will monitor you carefully for complications. HOME CARE INSTRUCTIONS  No special instructions are needed after a transfusion. You may find your energy is better. Speak with your caregiver about any limitations on activity for underlying diseases you may have. SEEK MEDICAL CARE IF:   Your condition is not improving after your transfusion.  You develop redness or irritation at the intravenous (IV) site. SEEK IMMEDIATE MEDICAL CARE IF:   Any of the following symptoms occur over the next 12 hours:  Shaking chills.  You have a temperature by mouth above 102 F (38.9 C), not controlled by medicine.  Chest, back, or muscle pain.  People around you feel you are not acting correctly or are confused.  Shortness of breath or difficulty breathing.  Dizziness and fainting.  You get a rash or develop hives.  You have a decrease in urine output.  Your urine turns a dark color or changes to pink, red, or brown. Any of the following symptoms occur over the next 10 days:  You have a temperature by mouth above 102 F (38.9 C), not controlled by medicine.  Shortness of breath.  Weakness after normal activity.  The white part of the eye turns yellow (jaundice).  You have a decrease in the amount of urine or are urinating less often.  Your urine turns a dark color or changes to pink, red, or brown. Document Released: 05/02/2000 Document Revised: 07/28/2011 Document Reviewed: 12/20/2007 [facility] Patient Information 2014 [place], [place].  _______________________________________________________________________

## 2019-03-23 ENCOUNTER — Encounter (HOSPITAL_COMMUNITY): Payer: Self-pay

## 2019-03-23 ENCOUNTER — Encounter (HOSPITAL_COMMUNITY)
Admission: RE | Admit: 2019-03-23 | Discharge: 2019-03-23 | Disposition: A | Discharge status: Home / Self Care | Payer: Medicare Other | Source: Ambulatory Visit | Attending: Orthopedic Surgery | Admitting: Orthopedic Surgery

## 2019-03-23 ENCOUNTER — Other Ambulatory Visit: Payer: Self-pay

## 2019-03-23 DIAGNOSIS — I252 Old myocardial infarction: Secondary | ICD-10-CM | POA: Diagnosis not present

## 2019-03-23 DIAGNOSIS — N183 Chronic kidney disease, stage 3 unspecified: Secondary | ICD-10-CM | POA: Insufficient documentation

## 2019-03-23 DIAGNOSIS — E785 Hyperlipidemia, unspecified: Secondary | ICD-10-CM | POA: Insufficient documentation

## 2019-03-23 DIAGNOSIS — Z96642 Presence of left artificial hip joint: Secondary | ICD-10-CM | POA: Diagnosis not present

## 2019-03-23 DIAGNOSIS — Z01812 Encounter for preprocedural laboratory examination: Secondary | ICD-10-CM | POA: Insufficient documentation

## 2019-03-23 DIAGNOSIS — I129 Hypertensive chronic kidney disease with stage 1 through stage 4 chronic kidney disease, or unspecified chronic kidney disease: Secondary | ICD-10-CM | POA: Insufficient documentation

## 2019-03-23 DIAGNOSIS — I429 Cardiomyopathy, unspecified: Secondary | ICD-10-CM | POA: Diagnosis not present

## 2019-03-23 DIAGNOSIS — M1711 Unilateral primary osteoarthritis, right knee: Secondary | ICD-10-CM | POA: Insufficient documentation

## 2019-03-23 DIAGNOSIS — Z90710 Acquired absence of both cervix and uterus: Secondary | ICD-10-CM | POA: Diagnosis not present

## 2019-03-23 DIAGNOSIS — M810 Age-related osteoporosis without current pathological fracture: Secondary | ICD-10-CM | POA: Diagnosis not present

## 2019-03-23 DIAGNOSIS — Z79899 Other long term (current) drug therapy: Secondary | ICD-10-CM | POA: Insufficient documentation

## 2019-03-23 DIAGNOSIS — Z86718 Personal history of other venous thrombosis and embolism: Secondary | ICD-10-CM | POA: Diagnosis not present

## 2019-03-23 DIAGNOSIS — E559 Vitamin D deficiency, unspecified: Secondary | ICD-10-CM | POA: Diagnosis not present

## 2019-03-23 LAB — BASIC METABOLIC PANEL
Anion gap: 10 (ref 5–15)
BUN: 21 mg/dL (ref 8–23)
CO2: 29 mmol/L (ref 22–32)
Calcium: 10 mg/dL (ref 8.9–10.3)
Chloride: 98 mmol/L (ref 98–111)
Creatinine, Ser: 0.88 mg/dL (ref 0.44–1.00)
GFR calc Af Amer: >60 mL/min (ref >60)
GFR calc non Af Amer: >60 mL/min (ref >60)
Glucose, Bld: 105 mg/dL — ABNORMAL HIGH (ref 70–99)
Potassium: 3.8 mmol/L (ref 3.5–5.1)
Sodium: 137 mmol/L (ref 135–145)

## 2019-03-23 LAB — CBC
HCT: 44.7 % (ref 36.0–46.0)
Hemoglobin: 14.7 g/dL (ref 12.0–15.0)
MCH: 31.1 pg (ref 26.0–34.0)
MCHC: 32.9 g/dL (ref 30.0–36.0)
MCV: 94.7 fL (ref 80.0–100.0)
Platelets: 375 10*3/uL (ref 150–400)
RBC: 4.72 MIL/uL (ref 3.87–5.11)
RDW: 12.2 % (ref 11.5–15.5)
WBC: 10.1 10*3/uL (ref 4.0–10.5)
nRBC: 0 % (ref 0.0–0.2)

## 2019-03-23 LAB — SURGICAL PCR SCREEN
MRSA, PCR: NEGATIVE
Staphylococcus aureus: NEGATIVE

## 2019-03-23 MED ORDER — CHLORHEXIDINE GLUCONATE 4 % EX LIQD
60.0000 mL | Freq: Once | CUTANEOUS | Status: DC
  Filled 2019-03-23: qty 60

## 2019-03-23 NOTE — Progress Notes (Signed)
SPOKE W/  _ [place]     SCREENING SYMPTOMS OF COVID 19:   COUGH--NO  RUNNY NOSE--- NO  SORE THROAT---NO  NASAL CONGESTION----NO  SNEEZING----NO  SHORTNESS OF BREATH---NO  DIFFICULTY BREATHING---NO  TEMP >100.0 -----NO  UNEXPLAINED BODY ACHES------NO  CHILLS -------- NO  HEADACHES ---------NO  LOSS OF SMELL/ TASTE --------NO    HAVE YOU OR ANY FAMILY MEMBER TRAVELLED PAST 14 DAYS OUT OF THE   COUNTY---YES STATE----NO COUNTRY----NO  HAVE YOU OR ANY FAMILY MEMBER BEEN EXPOSED TO ANYONE WITH COVID 19? NO

## 2019-03-23 NOTE — Progress Notes (Signed)
PCP - Dr. Howard Pouch Cardiologist - Dr. Claiborne Billings... LOV 11/27/2018 with [name] PA Pulomonologist LOV 11/15/18 Chest x-ray -  A/21/20 2 view CT Angio Chest 06/30/18 Ct chest high resolution on 07/19/2018 EKG - 03/18/2019 Stress Test -  ECHO - 08/27/15 Cardiac Cath - 05/02/15 Sleep Study -  CPAP -   Fasting Blood Sugar -  Checks Blood Sugar _____ times a day  Blood Thinner Instructions: Aspirin Instructions: Last Dose:  Anesthesia review: Chart sent to Konrad Felix PA for review. Pt was asked to get pulomanry clearance on 03/01/2019. Cleared for surgery from pulmonology on 10//23/2020  HX of: DVT/ PE, Stage 3 Chronic Kidney disease, Stemi 05/02/15, HTN, CAD, interstitial lung disease,   Patient denies shortness of breath, fever, cough and chest pain at PAT appointment   Patient verbalized understanding of instructions that were given to them at the PAT appointment. Patient was also instructed that they will need to review over the PAT instructions again at home before surgery.

## 2019-03-25 ENCOUNTER — Other Ambulatory Visit (HOSPITAL_COMMUNITY)
Admission: RE | Admit: 2019-03-25 | Discharge: 2019-03-25 | Disposition: A | Discharge status: Home / Self Care | Payer: Medicare Other | Source: Ambulatory Visit | Attending: Orthopedic Surgery | Admitting: Orthopedic Surgery

## 2019-03-25 DIAGNOSIS — Z01812 Encounter for preprocedural laboratory examination: Secondary | ICD-10-CM | POA: Insufficient documentation

## 2019-03-25 DIAGNOSIS — Z20828 Contact with and (suspected) exposure to other viral communicable diseases: Secondary | ICD-10-CM | POA: Diagnosis not present

## 2019-03-26 LAB — NOVEL CORONAVIRUS, NAA (HOSP ORDER, SEND-OUT TO REF LAB; TAT 18-24 HRS): SARS-CoV-2, NAA: NOT DETECTED
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
## 2019-03-28 NOTE — Anesthesia Preprocedure Evaluation (Addendum)
Anesthesia Evaluation  Patient identified by MRN, date of birth, ID band Patient awake    Reviewed: Allergy & Precautions, NPO status , Patient's Chart, lab work & pertinent test results  Airway Mallampati: I  TM Distance: >3 FB Neck ROM: Full    Dental  (+) Partial Lower, Dental Advisory Given   Pulmonary neg pulmonary ROS,    breath sounds clear to auscultation       Cardiovascular hypertension, Pt. on medications + CAD and + Past MI   Rhythm:Regular Rate:Normal     Neuro/Psych negative neurological ROS  negative psych ROS   GI/Hepatic Neg liver ROS, hiatal hernia, GERD  ,  Endo/Other  negative endocrine ROS  Renal/GU Renal disease     Musculoskeletal  (+) Arthritis ,   Abdominal Normal abdominal exam  (+)   Peds  Hematology negative hematology ROS (+)   Anesthesia Other Findings   Reproductive/Obstetrics                           Lab Results  Component Value Date   WBC 10.1 03/23/2019   HGB 14.7 03/23/2019   HCT 44.7 03/23/2019   MCV 94.7 03/23/2019   PLT 375 03/23/2019   Lab Results  Component Value Date   CREATININE 0.88 03/23/2019   BUN 21 03/23/2019   NA 137 03/23/2019   K 3.8 03/23/2019   CL 98 03/23/2019   CO2 29 03/23/2019   Lab Results  Component Value Date   INR 1.38 05/02/2015   INR 1.8 (H) 05/01/2007   INR 1.3 04/30/2007     Echo: Left ventricle:  The cavity size was normal. Wall thickness was normal. Systolic function was normal. The estimated ejection fraction was in the range of 55% to 60%. Wall motion was normal; there were no regional wall motion abnormalities. Doppler parameters are consistent with abnormal left ventricular relaxation (grade 1 diastolic dysfunction).  ------------------------------------------------------------------- Aortic valve:   Structurally normal valve.   Cusp separation was normal.  Doppler:  Transvalvular velocity was  within the normal range. There was no stenosis. There was no regurgitation.  Anesthesia Physical Anesthesia Plan  ASA: III  Anesthesia Plan: Spinal   Post-op Pain Management:  Regional for Post-op pain   Induction: Intravenous  PONV Risk Score and Plan: 3 and Ondansetron, Propofol infusion and Dexamethasone  Airway Management Planned: Natural Airway and Simple Face Mask  Additional Equipment: None  Intra-op Plan:   Post-operative Plan:   Informed Consent: I have reviewed the patients History and Physical, chart, labs and discussed the procedure including the risks, benefits and alternatives for the proposed anesthesia with the patient or authorized representative who has indicated his/her understanding and acceptance.       Plan Discussed with: CRNA  Anesthesia Plan Comments: (See PAT note 03/23/2019, Konrad Felix, PA-C)      Anesthesia Quick Evaluation
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
## 2019-03-29 ENCOUNTER — Ambulatory Visit (HOSPITAL_COMMUNITY): Payer: Medicare Other | Admitting: Certified Registered"

## 2019-03-29 ENCOUNTER — Other Ambulatory Visit: Payer: Self-pay

## 2019-03-29 ENCOUNTER — Observation Stay (HOSPITAL_COMMUNITY)
Admission: RE | Admit: 2019-03-29 | Discharge: 2019-03-30 | Disposition: A | Discharge status: Home / Self Care | Payer: Medicare Other | Source: Other Acute Inpatient Hospital | Attending: Orthopedic Surgery | Admitting: Orthopedic Surgery

## 2019-03-29 ENCOUNTER — Ambulatory Visit (HOSPITAL_COMMUNITY): Payer: Medicare Other | Admitting: Physician Assistant

## 2019-03-29 ENCOUNTER — Encounter (HOSPITAL_COMMUNITY)
Admission: RE | Disposition: A | Discharge status: Home / Self Care | Payer: Self-pay | Source: Other Acute Inpatient Hospital | Attending: Orthopedic Surgery

## 2019-03-29 ENCOUNTER — Encounter (HOSPITAL_COMMUNITY): Payer: Self-pay | Admitting: *Deleted

## 2019-03-29 DIAGNOSIS — E785 Hyperlipidemia, unspecified: Secondary | ICD-10-CM | POA: Insufficient documentation

## 2019-03-29 DIAGNOSIS — Z888 Allergy status to other drugs, medicaments and biological substances status: Secondary | ICD-10-CM | POA: Diagnosis not present

## 2019-03-29 DIAGNOSIS — I10 Essential (primary) hypertension: Secondary | ICD-10-CM | POA: Diagnosis not present

## 2019-03-29 DIAGNOSIS — M810 Age-related osteoporosis without current pathological fracture: Secondary | ICD-10-CM | POA: Insufficient documentation

## 2019-03-29 DIAGNOSIS — I2510 Atherosclerotic heart disease of native coronary artery without angina pectoris: Secondary | ICD-10-CM | POA: Insufficient documentation

## 2019-03-29 DIAGNOSIS — Z96642 Presence of left artificial hip joint: Secondary | ICD-10-CM | POA: Diagnosis not present

## 2019-03-29 DIAGNOSIS — I252 Old myocardial infarction: Secondary | ICD-10-CM | POA: Diagnosis not present

## 2019-03-29 DIAGNOSIS — I129 Hypertensive chronic kidney disease with stage 1 through stage 4 chronic kidney disease, or unspecified chronic kidney disease: Secondary | ICD-10-CM | POA: Insufficient documentation

## 2019-03-29 DIAGNOSIS — Z8249 Family history of ischemic heart disease and other diseases of the circulatory system: Secondary | ICD-10-CM | POA: Insufficient documentation

## 2019-03-29 DIAGNOSIS — Z86718 Personal history of other venous thrombosis and embolism: Secondary | ICD-10-CM | POA: Diagnosis not present

## 2019-03-29 DIAGNOSIS — Z79899 Other long term (current) drug therapy: Secondary | ICD-10-CM | POA: Diagnosis not present

## 2019-03-29 DIAGNOSIS — J849 Interstitial pulmonary disease, unspecified: Secondary | ICD-10-CM | POA: Insufficient documentation

## 2019-03-29 DIAGNOSIS — N183 Chronic kidney disease, stage 3 unspecified: Secondary | ICD-10-CM | POA: Insufficient documentation

## 2019-03-29 DIAGNOSIS — Z96651 Presence of right artificial knee joint: Secondary | ICD-10-CM

## 2019-03-29 DIAGNOSIS — I5181 Takotsubo syndrome: Secondary | ICD-10-CM | POA: Insufficient documentation

## 2019-03-29 DIAGNOSIS — K219 Gastro-esophageal reflux disease without esophagitis: Secondary | ICD-10-CM | POA: Insufficient documentation

## 2019-03-29 DIAGNOSIS — Z8349 Family history of other endocrine, nutritional and metabolic diseases: Secondary | ICD-10-CM | POA: Diagnosis not present

## 2019-03-29 DIAGNOSIS — M1711 Unilateral primary osteoarthritis, right knee: Secondary | ICD-10-CM | POA: Diagnosis not present

## 2019-03-29 HISTORY — DX: Presence of right artificial knee joint: Z96.651

## 2019-03-29 HISTORY — PX: TOTAL KNEE ARTHROPLASTY: SHX125

## 2019-03-29 LAB — TYPE AND SCREEN
ABO/RH(D): A POS
Antibody Screen: NEGATIVE

## 2019-03-29 SURGERY — ARTHROPLASTY, KNEE, TOTAL
Anesthesia: Spinal | Site: Knee | Laterality: Right

## 2019-03-29 MED ORDER — ACETAMINOPHEN 325 MG PO TABS: 325.0000 mg | ORAL_TABLET | Freq: Four times a day (QID) | ORAL | Status: DC | PRN

## 2019-03-29 MED ORDER — METHOCARBAMOL 500 MG IVPB - SIMPLE MED
500.0000 mg | Freq: Four times a day (QID) | INTRAVENOUS | Status: DC | PRN
  Administered 2019-03-29: 500 mg via INTRAVENOUS
  Filled 2019-03-29: qty 50

## 2019-03-29 MED ORDER — TRANEXAMIC ACID-NACL 1000-0.7 MG/100ML-% IV SOLN
1000.0000 mg | INTRAVENOUS | Status: AC
  Administered 2019-03-29: 10:00:00 1000 mg via INTRAVENOUS
  Filled 2019-03-29: qty 100

## 2019-03-29 MED ORDER — ATROPINE SULFATE 1 MG/ML IJ SOLN
INTRAMUSCULAR | Status: DC | PRN
  Administered 2019-03-29 (×2): .1 mg via INTRAVENOUS

## 2019-03-29 MED ORDER — SODIUM CHLORIDE 0.9 % IR SOLN
Status: DC | PRN
  Administered 2019-03-29: 1000 mL

## 2019-03-29 MED ORDER — CEFAZOLIN SODIUM-DEXTROSE 2-4 GM/100ML-% IV SOLN
2.0000 g | INTRAVENOUS | Status: AC
  Administered 2019-03-29: 2 g via INTRAVENOUS
  Filled 2019-03-29: qty 100

## 2019-03-29 MED ORDER — SODIUM CHLORIDE (PF) 0.9 % IJ SOLN
INTRAMUSCULAR | Status: DC | PRN
  Administered 2019-03-29: 30 mL

## 2019-03-29 MED ORDER — PROPOFOL 10 MG/ML IV BOLUS
INTRAVENOUS | Status: AC
  Filled 2019-03-29: qty 80

## 2019-03-29 MED ORDER — LACTATED RINGERS IV SOLN: INTRAVENOUS | Status: DC

## 2019-03-29 MED ORDER — MAGNESIUM CITRATE PO SOLN: 1.0000 | Freq: Once | ORAL | Status: DC | PRN

## 2019-03-29 MED ORDER — ONDANSETRON HCL 4 MG/2ML IJ SOLN: 4.0000 mg | Freq: Four times a day (QID) | INTRAMUSCULAR | Status: DC | PRN

## 2019-03-29 MED ORDER — LACTATED RINGERS IV SOLN
INTRAVENOUS | Status: DC
  Administered 2019-03-29 (×2): via INTRAVENOUS

## 2019-03-29 MED ORDER — ALUM & MAG HYDROXIDE-SIMETH 200-200-20 MG/5ML PO SUSP: 15.0000 mL | ORAL | Status: DC | PRN

## 2019-03-29 MED ORDER — POVIDONE-IODINE 10 % EX SWAB
2.0000 "application " | Freq: Once | CUTANEOUS | Status: AC
  Administered 2019-03-29: 2 via TOPICAL

## 2019-03-29 MED ORDER — MEPERIDINE HCL 50 MG/ML IJ SOLN: 6.2500 mg | INTRAMUSCULAR | Status: DC | PRN

## 2019-03-29 MED ORDER — FUROSEMIDE 20 MG PO TABS: 20.0000 mg | ORAL_TABLET | Freq: Every day | ORAL | Status: DC | PRN

## 2019-03-29 MED ORDER — DEXAMETHASONE SODIUM PHOSPHATE 10 MG/ML IJ SOLN
10.0000 mg | Freq: Once | INTRAMUSCULAR | Status: AC
  Administered 2019-03-29: 10 mg via INTRAVENOUS

## 2019-03-29 MED ORDER — METHOCARBAMOL 500 MG PO TABS: 500.0000 mg | ORAL_TABLET | Freq: Four times a day (QID) | ORAL | Status: DC | PRN

## 2019-03-29 MED ORDER — RIVAROXABAN 10 MG PO TABS
10.0000 mg | ORAL_TABLET | ORAL | Status: DC
  Administered 2019-03-30: 10 mg via ORAL
  Filled 2019-03-29: qty 1

## 2019-03-29 MED ORDER — SODIUM CHLORIDE (PF) 0.9 % IJ SOLN
INTRAMUSCULAR | Status: AC
  Filled 2019-03-29: qty 50

## 2019-03-29 MED ORDER — BUPIVACAINE-EPINEPHRINE (PF) 0.25% -1:200000 IJ SOLN
INTRAMUSCULAR | Status: DC | PRN
  Administered 2019-03-29: 30 mL

## 2019-03-29 MED ORDER — FENTANYL CITRATE (PF) 100 MCG/2ML IJ SOLN
INTRAMUSCULAR | Status: AC
  Filled 2019-03-29: qty 2

## 2019-03-29 MED ORDER — BUPIVACAINE-EPINEPHRINE 0.25% -1:200000 IJ SOLN
INTRAMUSCULAR | Status: AC
  Filled 2019-03-29: qty 1

## 2019-03-29 MED ORDER — FENTANYL CITRATE (PF) 100 MCG/2ML IJ SOLN
25.0000 ug | INTRAMUSCULAR | Status: DC | PRN
  Administered 2019-03-29 (×2): 50 ug via INTRAVENOUS

## 2019-03-29 MED ORDER — METOCLOPRAMIDE HCL 5 MG/ML IJ SOLN: 5.0000 mg | Freq: Three times a day (TID) | INTRAMUSCULAR | Status: DC | PRN

## 2019-03-29 MED ORDER — METHOCARBAMOL 500 MG IVPB - SIMPLE MED
INTRAVENOUS | Status: AC
  Filled 2019-03-29: qty 50

## 2019-03-29 MED ORDER — DEXAMETHASONE SODIUM PHOSPHATE 10 MG/ML IJ SOLN
10.0000 mg | Freq: Once | INTRAMUSCULAR | Status: AC
  Administered 2019-03-30: 10 mg via INTRAVENOUS
  Filled 2019-03-29: qty 1

## 2019-03-29 MED ORDER — POLYVINYL ALCOHOL 1.4 % OP SOLN: 1.0000 [drp] | OPHTHALMIC | Status: DC | PRN

## 2019-03-29 MED ORDER — PROMETHAZINE HCL 25 MG/ML IJ SOLN: 6.2500 mg | INTRAMUSCULAR | Status: DC | PRN

## 2019-03-29 MED ORDER — CELECOXIB 200 MG PO CAPS
200.0000 mg | ORAL_CAPSULE | Freq: Two times a day (BID) | ORAL | Status: DC
  Administered 2019-03-29 – 2019-03-30 (×2): 200 mg via ORAL
  Filled 2019-03-29 (×2): qty 1

## 2019-03-29 MED ORDER — DOCUSATE SODIUM 100 MG PO CAPS
100.0000 mg | ORAL_CAPSULE | Freq: Two times a day (BID) | ORAL | Status: DC
  Administered 2019-03-29 – 2019-03-30 (×2): 100 mg via ORAL
  Filled 2019-03-29 (×2): qty 1

## 2019-03-29 MED ORDER — KETOROLAC TROMETHAMINE 30 MG/ML IJ SOLN
INTRAMUSCULAR | Status: AC
  Filled 2019-03-29: qty 1

## 2019-03-29 MED ORDER — KETOROLAC TROMETHAMINE 30 MG/ML IJ SOLN
INTRAMUSCULAR | Status: DC | PRN
  Administered 2019-03-29: 30 mg

## 2019-03-29 MED ORDER — BISACODYL 10 MG RE SUPP: 10.0000 mg | Freq: Every day | RECTAL | Status: DC | PRN

## 2019-03-29 MED ORDER — ONDANSETRON HCL 4 MG PO TABS: 4.0000 mg | ORAL_TABLET | Freq: Four times a day (QID) | ORAL | Status: DC | PRN

## 2019-03-29 MED ORDER — STERILE WATER FOR IRRIGATION IR SOLN
Status: DC | PRN
  Administered 2019-03-29 (×2): 1000 mL

## 2019-03-29 MED ORDER — SODIUM CHLORIDE 0.9 % IV SOLN
INTRAVENOUS | Status: DC
  Administered 2019-03-29: 16:00:00 via INTRAVENOUS

## 2019-03-29 MED ORDER — PHENYLEPHRINE HCL-NACL 20-0.9 MG/250ML-% IV SOLN
INTRAVENOUS | Status: DC | PRN
  Administered 2019-03-29: 50 ug/min via INTRAVENOUS

## 2019-03-29 MED ORDER — HYDROMORPHONE HCL 1 MG/ML IJ SOLN
INTRAMUSCULAR | Status: AC
  Administered 2019-03-29: 1 mg via INTRAVENOUS
  Filled 2019-03-29: qty 1

## 2019-03-29 MED ORDER — FENTANYL CITRATE (PF) 100 MCG/2ML IJ SOLN
INTRAMUSCULAR | Status: AC
  Administered 2019-03-29: 25 ug
  Filled 2019-03-29: qty 2

## 2019-03-29 MED ORDER — POLYETHYLENE GLYCOL 3350 17 G PO PACK
17.0000 g | PACK | Freq: Two times a day (BID) | ORAL | Status: DC
  Administered 2019-03-29: 17 g via ORAL
  Filled 2019-03-29: qty 1

## 2019-03-29 MED ORDER — METOCLOPRAMIDE HCL 5 MG PO TABS: 5.0000 mg | ORAL_TABLET | Freq: Three times a day (TID) | ORAL | Status: DC | PRN

## 2019-03-29 MED ORDER — ACETAMINOPHEN 10 MG/ML IV SOLN: 1000.0000 mg | Freq: Once | INTRAVENOUS | Status: DC | PRN

## 2019-03-29 MED ORDER — BUPIVACAINE IN DEXTROSE 0.75-8.25 % IT SOLN
INTRATHECAL | Status: DC | PRN
  Administered 2019-03-29: 1.4 mL via INTRATHECAL

## 2019-03-29 MED ORDER — MENTHOL 3 MG MT LOZG: 1.0000 | LOZENGE | OROMUCOSAL | Status: DC | PRN

## 2019-03-29 MED ORDER — CEFAZOLIN SODIUM-DEXTROSE 2-4 GM/100ML-% IV SOLN
2.0000 g | Freq: Four times a day (QID) | INTRAVENOUS | Status: AC
  Administered 2019-03-29 (×2): 2 g via INTRAVENOUS
  Filled 2019-03-29 (×2): qty 100

## 2019-03-29 MED ORDER — EZETIMIBE 10 MG PO TABS
10.0000 mg | ORAL_TABLET | Freq: Every day | ORAL | Status: DC
  Administered 2019-03-29: 10 mg via ORAL
  Filled 2019-03-29: qty 1

## 2019-03-29 MED ORDER — DIPHENHYDRAMINE HCL 12.5 MG/5ML PO ELIX: 12.5000 mg | ORAL_SOLUTION | ORAL | Status: DC | PRN

## 2019-03-29 MED ORDER — HYDROCODONE-ACETAMINOPHEN 5-325 MG PO TABS
1.0000 | ORAL_TABLET | ORAL | Status: DC | PRN
  Administered 2019-03-29 – 2019-03-30 (×3): 2 via ORAL
  Filled 2019-03-29 (×3): qty 2

## 2019-03-29 MED ORDER — HYDROMORPHONE HCL 1 MG/ML IJ SOLN
0.5000 mg | INTRAMUSCULAR | Status: DC | PRN
  Administered 2019-03-29: 14:00:00 1 mg via INTRAVENOUS

## 2019-03-29 MED ORDER — TRIAMTERENE-HCTZ 75-50 MG PO TABS
1.0000 | ORAL_TABLET | Freq: Every day | ORAL | Status: DC
  Filled 2019-03-29: qty 1

## 2019-03-29 MED ORDER — ONDANSETRON HCL 4 MG/2ML IJ SOLN
INTRAMUSCULAR | Status: DC | PRN
  Administered 2019-03-29: 4 mg via INTRAVENOUS

## 2019-03-29 MED ORDER — ACETAMINOPHEN 325 MG PO TABS: 325.0000 mg | ORAL_TABLET | Freq: Once | ORAL | Status: DC | PRN

## 2019-03-29 MED ORDER — PHENOL 1.4 % MT LIQD: 1.0000 | OROMUCOSAL | Status: DC | PRN

## 2019-03-29 MED ORDER — ACETAMINOPHEN 160 MG/5ML PO SOLN: 325.0000 mg | Freq: Once | ORAL | Status: DC | PRN

## 2019-03-29 MED ORDER — PROPOFOL 500 MG/50ML IV EMUL
INTRAVENOUS | Status: DC | PRN
  Administered 2019-03-29: 75 ug/kg/min via INTRAVENOUS

## 2019-03-29 MED ORDER — HYDROCODONE-ACETAMINOPHEN 7.5-325 MG PO TABS: 1.0000 | ORAL_TABLET | ORAL | Status: DC | PRN

## 2019-03-29 MED ORDER — FERROUS SULFATE 325 (65 FE) MG PO TABS
325.0000 mg | ORAL_TABLET | Freq: Two times a day (BID) | ORAL | Status: DC
  Administered 2019-03-30: 325 mg via ORAL
  Filled 2019-03-29: qty 1

## 2019-03-29 MED ORDER — CARBOXYMETHYLCELLULOSE SODIUM 0.5 % OP SOLN: 1.0000 [drp] | Freq: Every day | OPHTHALMIC | Status: DC

## 2019-03-29 SURGICAL SUPPLY — 64 items
ADH SKN CLS APL DERMABOND .7 (GAUZE/BANDAGES/DRESSINGS) ×1
ATTUNE MED ANAT PAT 35 KNEE (Knees) ×1 IMPLANT
ATTUNE MED ANAT PAT 35MM KNEE (Knees) ×1 IMPLANT
ATTUNE PSFEM RTSZ4 NARCEM KNEE (Femur) ×2 IMPLANT
ATTUNE PSRP INSR SZ4 8 KNEE (Insert) ×1 IMPLANT
ATTUNE PSRP INSR SZ4 8MM KNEE (Insert) ×1 IMPLANT
BAG SPEC THK2 15X12 ZIP CLS (MISCELLANEOUS)
BAG ZIPLOCK 12X15 (MISCELLANEOUS) IMPLANT
BASE TIBIAL ROT PLAT SZ 3 KNEE (Knees) IMPLANT
BLADE SAW SGTL 11.0X1.19X90.0M (BLADE) ×2 IMPLANT
BLADE SAW SGTL 13.0X1.19X90.0M (BLADE) ×3 IMPLANT
BLADE SURG SZ10 CARB STEEL (BLADE) ×6 IMPLANT
BNDG ELASTIC 6X5.8 VLCR STR LF (GAUZE/BANDAGES/DRESSINGS) ×3 IMPLANT
BOWL SMART MIX CTS (DISPOSABLE) ×3 IMPLANT
BSPLAT TIB 3 CMNT ROT PLAT STR (Knees) ×1 IMPLANT
CEMENT HV SMART SET (Cement) ×4 IMPLANT
COVER SURGICAL LIGHT HANDLE (MISCELLANEOUS) ×3 IMPLANT
COVER WAND RF STERILE (DRAPES) IMPLANT
CUFF TOURN SGL QUICK 34 (TOURNIQUET CUFF) ×3
CUFF TRNQT CYL 34X4.125X (TOURNIQUET CUFF) ×1 IMPLANT
DECANTER SPIKE VIAL GLASS SM (MISCELLANEOUS) ×6 IMPLANT
DERMABOND ADVANCED (GAUZE/BANDAGES/DRESSINGS) ×2
DERMABOND ADVANCED .7 DNX12 (GAUZE/BANDAGES/DRESSINGS) ×1 IMPLANT
DRAPE U-SHAPE 47X51 STRL (DRAPES) ×3 IMPLANT
DRESSING AQUACEL AG SP 3.5X10 (GAUZE/BANDAGES/DRESSINGS) ×1 IMPLANT
DRSG AQUACEL AG SP 3.5X10 (GAUZE/BANDAGES/DRESSINGS) ×3
DURAPREP 26ML APPLICATOR (WOUND CARE) ×6 IMPLANT
ELECT REM PT RETURN 15FT ADLT (MISCELLANEOUS) ×3 IMPLANT
GLOVE BIO SURGEON STRL SZ 6 (GLOVE) ×3 IMPLANT
GLOVE BIOGEL PI IND STRL 6.5 (GLOVE) ×1 IMPLANT
GLOVE BIOGEL PI IND STRL 7.5 (GLOVE) ×1 IMPLANT
GLOVE BIOGEL PI IND STRL 8.5 (GLOVE) ×1 IMPLANT
GLOVE BIOGEL PI INDICATOR 6.5 (GLOVE) ×2
GLOVE BIOGEL PI INDICATOR 7.5 (GLOVE) ×2
GLOVE BIOGEL PI INDICATOR 8.5 (GLOVE) ×2
GLOVE ECLIPSE 8.0 STRL XLNG CF (GLOVE) ×3 IMPLANT
GLOVE ORTHO TXT STRL SZ7.5 (GLOVE) ×3 IMPLANT
GOWN STRL REUS W/ TWL LRG LVL3 (GOWN DISPOSABLE) ×1 IMPLANT
GOWN STRL REUS W/TWL 2XL LVL3 (GOWN DISPOSABLE) ×3 IMPLANT
GOWN STRL REUS W/TWL LRG LVL3 (GOWN DISPOSABLE) ×6 IMPLANT
HANDPIECE INTERPULSE COAX TIP (DISPOSABLE) ×3
HOLDER FOLEY CATH W/STRAP (MISCELLANEOUS) IMPLANT
KIT TURNOVER KIT A (KITS) IMPLANT
MANIFOLD NEPTUNE II (INSTRUMENTS) ×3 IMPLANT
NDL SAFETY ECLIPSE 18X1.5 (NEEDLE) IMPLANT
NEEDLE HYPO 18GX1.5 SHARP (NEEDLE)
NS IRRIG 1000ML POUR BTL (IV SOLUTION) ×3 IMPLANT
PACK TOTAL KNEE CUSTOM (KITS) ×3 IMPLANT
PIN FIX SIGMA LCS THRD HI (PIN) ×2 IMPLANT
PIN THREADED HEADED SIGMA (PIN) ×2 IMPLANT
PROTECTOR NERVE ULNAR (MISCELLANEOUS) ×3 IMPLANT
SET HNDPC FAN SPRY TIP SCT (DISPOSABLE) ×1 IMPLANT
SET PAD KNEE POSITIONER (MISCELLANEOUS) ×3 IMPLANT
SUT MNCRL AB 4-0 PS2 18 (SUTURE) ×3 IMPLANT
SUT STRATAFIX PDS+ 0 24IN (SUTURE) ×3 IMPLANT
SUT VIC AB 1 CT1 36 (SUTURE) ×3 IMPLANT
SUT VIC AB 2-0 CT1 27 (SUTURE) ×9
SUT VIC AB 2-0 CT1 TAPERPNT 27 (SUTURE) ×3 IMPLANT
SYR 3ML LL SCALE MARK (SYRINGE) ×3 IMPLANT
TIBIAL BASE ROT PLAT SZ 3 KNEE (Knees) ×3 IMPLANT
TRAY FOLEY MTR SLVR 16FR STAT (SET/KITS/TRAYS/PACK) ×3 IMPLANT
WATER STERILE IRR 1000ML POUR (IV SOLUTION) ×6 IMPLANT
WRAP KNEE MAXI GEL POST OP (GAUZE/BANDAGES/DRESSINGS) ×3 IMPLANT
YANKAUER SUCT BULB TIP 10FT TU (MISCELLANEOUS) ×3 IMPLANT

## 2019-03-29 NOTE — Plan of Care (Signed)
Continue with current POC

## 2019-03-29 NOTE — Evaluation (Signed)
Physical Therapy Evaluation Patient Details Name: Angela Preston MRN: [id number] DOB: 1940/12/27 Today's Date: 03/29/2019   History of Present Illness  R TKA; PMH of interstitial lung disease, L THA revision, CKD, MI, DVT  Clinical Impression  Pt admitted with above diagnosis. Pt ambulated 15' with RW, distance limited by fatigue. Initiated TKA HEP. Good progress expected.  Pt currently with functional limitations due to the deficits listed below (see PT Problem List). Pt will benefit from skilled PT to increase their independence and safety with mobility to allow discharge to the venue listed below.       Follow Up Recommendations Follow surgeon's recommendation for DC plan and follow-up therapies    Equipment Recommendations  None recommended by PT    Recommendations for Other Services       Precautions / Restrictions        Mobility  Bed Mobility Overal bed mobility: Needs Assistance Bed Mobility: Supine to Sit     Supine to sit: Min assist     General bed mobility comments: assist for RLE out of bed  Transfers Overall transfer level: Needs assistance Equipment used: Rolling walker (2 wheeled) Transfers: Sit to/from Stand Sit to Stand: Min assist         General transfer comment: VCs hand placement  Ambulation/Gait Ambulation/Gait assistance: Min guard Gait Distance (Feet): 15 Feet Assistive device: Rolling walker (2 wheeled) Gait Pattern/deviations: Step-to pattern;Decreased stride length;Decreased weight shift to right Gait velocity: decr   General Gait Details: VCs sequencing and positioning in RW, distance limited by fatigue/pain  Stairs            Wheelchair Mobility    Modified Rankin (Stroke Patients Only)       Balance Overall balance assessment: Modified Independent                                           Pertinent Vitals/Pain Pain Assessment: 0-10 Pain Score: 5  Pain Location: R knee Pain Descriptors /  Indicators: Sore Pain Intervention(s): Limited activity within patient's tolerance;Monitored during session;Premedicated before session;Ice applied    Home Living Family/patient expects to be discharged to:: Private residence Living Arrangements: Spouse/significant other Available Help at Discharge: Family;Available 24 hours/day   Home Access: Stairs to enter Entrance Stairs-Rails: Can reach both Entrance Stairs-Number of Steps: 3 Home Layout: One level Home Equipment: Walker - 2 wheels;Bedside commode      Prior Function Level of Independence: Independent               Hand Dominance        Extremity/Trunk Assessment   Upper Extremity Assessment Upper Extremity Assessment: Overall WFL for tasks assessed    Lower Extremity Assessment Lower Extremity Assessment: RLE deficits/detail RLE Deficits / Details: SLR -3/5, knee AAROM 5-40* RLE Sensation: decreased light touch(R foot) RLE Coordination: WNL    Cervical / Trunk Assessment Cervical / Trunk Assessment: Normal  Communication   Communication: HOH  Cognition Arousal/Alertness: Awake/alert Behavior During Therapy: WFL for tasks assessed/performed Overall Cognitive Status: Within Functional Limits for tasks assessed                                        General Comments      Exercises Total Joint Exercises Ankle Circles/Pumps: AROM;Both;10 reps;Supine Quad Sets: AROM;Right;5  reps;Supine Heel Slides: AAROM;Right;5 reps;Supine Long Arc Quad: AROM;Right;5 reps;Seated Goniometric ROM: 5-40* AAROM R knee   Assessment/Plan    PT Assessment Patient needs continued PT services  PT Problem List Decreased strength;Decreased range of motion;Decreased activity tolerance;Pain;Decreased mobility       PT Treatment Interventions DME instruction;Gait training;Stair training;Therapeutic exercise;Therapeutic activities;Patient/family education    PT Goals (Current goals can be found in the Care Plan  section)  Acute Rehab PT Goals Patient Stated Goal: to be able to walk, [place] PT Goal Formulation: With patient/family Time For Goal Achievement: 04/05/19 Potential to Achieve Goals: Good    Frequency 7X/week   Barriers to discharge        Co-evaluation               AM-PAC PT "6 Clicks" Mobility  Outcome Measure Help needed turning from your back to your side while in a flat bed without using bedrails?: A Little Help needed moving from lying on your back to sitting on the side of a flat bed without using bedrails?: A Little Help needed moving to and from a bed to a chair (including a wheelchair)?: A Little Help needed standing up from a chair using your arms (e.g., wheelchair or bedside chair)?: A Little Help needed to walk in hospital room?: A Little Help needed climbing 3-5 steps with a railing? : A Lot 6 Click Score: 17    End of Session Equipment Utilized During Treatment: Gait belt Activity Tolerance: Patient tolerated treatment well Patient left: in chair;with call bell/phone within reach;with chair alarm set Nurse Communication: Mobility status PT Visit Diagnosis: Difficulty in walking, not elsewhere classified (R26.2);Pain Pain - Right/Left: Right Pain - part of body: Knee    Time: [id number] PT Time Calculation (min) (ACUTE ONLY): 13 min   Charges:   PT Evaluation $PT Eval Low Complexity: 1 Low          Philomena Doheny PT 03/29/2019  Acute Rehabilitation Services Pager [phone] Office [phone]

## 2019-03-29 NOTE — Discharge Instructions (Addendum)
INSTRUCTIONS AFTER JOINT REPLACEMENT  ° °o Remove items at home which could result in a fall. This includes throw rugs or furniture in walking pathways °o ICE to the affected joint every three hours while awake for 30 minutes at a time, for at least the first 3-5 days, and then as needed for pain and swelling.  Continue to use ice for pain and swelling. You may notice swelling that will progress down to the foot and ankle.  This is normal after surgery.  Elevate your leg when you are not up walking on it.   °o Continue to use the breathing machine you got in the hospital (incentive spirometer) which will help keep your temperature down.  It is common for your temperature to cycle up and down following surgery, especially at night when you are not up moving around and exerting yourself.  The breathing machine keeps your lungs expanded and your temperature down. ° ° °DIET:  As you were doing prior to hospitalization, we recommend a well-balanced diet. ° °DRESSING / WOUND CARE / SHOWERING ° °Keep the surgical dressing until follow up.  The dressing is water proof, so you can shower without any extra covering.  IF THE DRESSING FALLS OFF or the wound gets wet inside, change the dressing with sterile gauze.  Please use good hand washing techniques before changing the dressing.  Do not use any lotions or creams on the incision until instructed by your surgeon.   ° °ACTIVITY ° °o Increase activity slowly as tolerated, but follow the weight bearing instructions below.   °o No driving for 6 weeks or until further direction given by your physician.  You cannot drive while taking narcotics.  °o No lifting or carrying greater than 10 lbs. until further directed by your surgeon. °o Avoid periods of inactivity such as sitting longer than an hour when not asleep. This helps prevent blood clots.  °o You may return to work once you are authorized by your doctor.  ° ° ° °WEIGHT BEARING  ° °Weight bearing as tolerated with assist  device (walker, cane, etc) as directed, use it as long as suggested by your surgeon or therapist, typically at least 4-6 weeks. ° ° °EXERCISES ° °Results after joint replacement surgery are often greatly improved when you follow the exercise, range of motion and muscle strengthening exercises prescribed by your doctor. Safety measures are also important to protect the joint from further injury. Any time any of these exercises cause you to have increased pain or swelling, decrease what you are doing until you are comfortable again and then slowly increase them. If you have problems or questions, call your caregiver or physical therapist for advice.  ° °Rehabilitation is important following a joint replacement. After just a few days of immobilization, the muscles of the leg can become weakened and shrink (atrophy).  These exercises are designed to build up the tone and strength of the thigh and leg muscles and to improve motion. Often times heat used for twenty to thirty minutes before working out will loosen up your tissues and help with improving the range of motion but do not use heat for the first two weeks following surgery (sometimes heat can increase post-operative swelling).  ° °These exercises can be done on a training (exercise) mat, on the floor, on a table or on a bed. Use whatever works the best and is most comfortable for you.    Use music or television while you are exercising so that   the exercises are a pleasant break in your day. This will make your life better with the exercises acting as a break in your routine that you can look forward to.   Perform all exercises about fifteen times, three times per day or as directed.  You should exercise both the operative leg and the other leg as well. ° °Exercises include: °  °• Quad Sets - Tighten up the muscle on the front of the thigh (Quad) and hold for 5-10 seconds.   °• Straight Leg Raises - With your knee straight (if you were given a brace, keep it on),  lift the leg to 60 degrees, hold for 3 seconds, and slowly lower the leg.  Perform this exercise against resistance later as your leg gets stronger.  °• Leg Slides: Lying on your back, slowly slide your foot toward your buttocks, bending your knee up off the floor (only go as far as is comfortable). Then slowly slide your foot back down until your leg is flat on the floor again.  °• Angel Wings: Lying on your back spread your legs to the side as far apart as you can without causing discomfort.  °• Hamstring Strength:  Lying on your back, push your heel against the floor with your leg straight by tightening up the muscles of your buttocks.  Repeat, but this time bend your knee to a comfortable angle, and push your heel against the floor.  You may put a pillow under the heel to make it more comfortable if necessary.  ° °A rehabilitation program following joint replacement surgery can speed recovery and prevent re-injury in the future due to weakened muscles. Contact your doctor or a physical therapist for more information on knee rehabilitation.  ° ° °CONSTIPATION ° °Constipation is defined medically as fewer than three stools per week and severe constipation as less than one stool per week.  Even if you have a regular bowel pattern at home, your normal regimen is likely to be disrupted due to multiple reasons following surgery.  Combination of anesthesia, postoperative narcotics, change in appetite and fluid intake all can affect your bowels.  ° °YOU MUST use at least one of the following options; they are listed in order of increasing strength to get the job done.  They are all available over the counter, and you may need to use some, POSSIBLY even all of these options:   ° °Drink plenty of fluids (prune juice may be helpful) and high fiber foods °Colace 100 mg by mouth twice a day  °Senokot for constipation as directed and as needed Dulcolax (bisacodyl), take with full glass of water  °Miralax (polyethylene glycol)  once or twice a day as needed. ° °If you have tried all these things and are unable to have a bowel movement in the first 3-4 days after surgery call either your surgeon or your primary doctor.   ° °If you experience loose stools or diarrhea, hold the medications until you stool forms back up.  If your symptoms do not get better within 1 week or if they get worse, check with your doctor.  If you experience "the worst abdominal pain ever" or develop nausea or vomiting, please contact the office immediately for further recommendations for treatment. ° ° °ITCHING:  If you experience itching with your medications, try taking only a single pain pill, or even half a pain pill at a time.  You can also use Benadryl over the counter for itching or also to   help with sleep.   TED HOSE STOCKINGS:  Use stockings on both legs until for at least 2 weeks or as directed by physician office. They may be removed at night for sleeping.  MEDICATIONS:  See your medication summary on the "After Visit Summary" that nursing will review with you.  You may have some home medications which will be placed on hold until you complete the course of blood thinner medication.  It is important for you to complete the blood thinner medication as prescribed.  PRECAUTIONS:  If you experience chest pain or shortness of breath - call 911 immediately for transfer to the hospital emergency department.   If you develop a fever greater that 101 F, purulent drainage from wound, increased redness or drainage from wound, foul odor from the wound/dressing, or calf pain - CONTACT YOUR SURGEON.                                                   FOLLOW-UP APPOINTMENTS:  If you do not already have a post-op appointment, please call the office for an appointment to be seen by your surgeon.  Guidelines for how soon to be seen are listed in your "After Visit Summary", but are typically between 1-4 weeks after surgery.  OTHER INSTRUCTIONS:   Knee  Replacement:  Do not place pillow under knee, focus on keeping the knee straight while resting.    MAKE SURE YOU:  . Understand these instructions.  . Get help right away if you are not doing well or get worse.    Thank you for letting us be a part of your medical care team.  It is a privilege we respect greatly.  We hope these instructions will help you stay on track for a fast and full recovery!   Information on my medicine - XARELTO (Rivaroxaban)  This medication education was reviewed with me or my healthcare representative as part of my discharge preparation.  The pharmacist that spoke with me during my hospital stay was:    Why was Xarelto prescribed for you? Xarelto was prescribed for you to reduce the risk of blood clots forming after orthopedic surgery. The medical term for these abnormal blood clots is venous thromboembolism (VTE).  What do you need to know about xarelto ? Take your Xarelto ONCE DAILY at the same time every day. You may take it either with or without food.  If you have difficulty swallowing the tablet whole, you may crush it and mix in applesauce just prior to taking your dose.  Take Xarelto exactly as prescribed by your doctor and DO NOT stop taking Xarelto without talking to the doctor who prescribed the medication.  Stopping without other VTE prevention medication to take the place of Xarelto may increase your risk of developing a clot.  After discharge, you should have regular check-up appointments with your healthcare provider that is prescribing your Xarelto.    What do you do if you miss a dose? If you miss a dose, take it as soon as you remember on the same day then continue your regularly scheduled once daily regimen the next day. Do not take two doses of Xarelto on the same day.   Important Safety Information A possible side effect of Xarelto is bleeding. You should call your healthcare provider right away if you experience any of   the  following: ? Bleeding from an injury or your nose that does not stop. ? Unusual colored urine (red or dark brown) or unusual colored stools (red or black). ? Unusual bruising for unknown reasons. ? A serious fall or if you hit your head (even if there is no bleeding).  Some medicines may interact with Xarelto and might increase your risk of bleeding while on Xarelto. To help avoid this, consult your healthcare provider or pharmacist prior to using any new prescription or non-prescription medications, including herbals, vitamins, non-steroidal anti-inflammatory drugs (NSAIDs) and supplements.  This website has more information on Xarelto: www.xarelto.com.     

## 2019-03-29 NOTE — Progress Notes (Signed)
Assisted Dr. Hollis with right, ultrasound guided, adductor canal block. Side rails up, monitors on throughout procedure. See vital signs in flow sheet. Tolerated Procedure well.  

## 2019-03-29 NOTE — Anesthesia Postprocedure Evaluation (Signed)
Anesthesia Post Note  Patient: Angela Preston  Procedure(s) Performed: TOTAL KNEE ARTHROPLASTY (Right Knee)     Patient location during evaluation: PACU Anesthesia Type: Spinal Level of consciousness: oriented and awake and alert Pain management: pain level controlled Vital Signs Assessment: post-procedure vital signs reviewed and stable Respiratory status: spontaneous breathing, respiratory function stable and patient connected to nasal cannula oxygen Cardiovascular status: blood pressure returned to baseline and stable Postop Assessment: no headache, no backache, no apparent nausea or vomiting and spinal receding Anesthetic complications: no    Last Vitals:  Vitals:   03/29/19 1400 03/29/19 1445  BP: 115/68 (!) 141/72  Pulse: (!) 54 60  Resp: 18 14  Temp: 36.6 C 36.5 C  SpO2: 100% 100%    Last Pain:  Vitals:   03/29/19 1445  TempSrc: Oral  PainSc:                  [name]

## 2019-03-29 NOTE — Anesthesia Procedure Notes (Signed)
Spinal  Patient location during procedure: OR Start time: 03/29/2019 9:58 AM End time: 03/29/2019 10:01 AM Staffing Anesthesiologist: Lyn Hollingshead, MD Performed: anesthesiologist  Preanesthetic Checklist Completed: patient identified, site marked, surgical consent, pre-op evaluation, timeout performed, IV checked, risks and benefits discussed and monitors and equipment checked Spinal Block Patient position: sitting Prep: site prepped and draped and DuraPrep Patient monitoring: continuous pulse ox and blood pressure Approach: midline Location: L3-4 Injection technique: single-shot Needle Needle type: Pencan  Needle gauge: 24 G Needle length: 10 cm Needle insertion depth: 5 cm Assessment Sensory level: T8

## 2019-03-29 NOTE — Plan of Care (Signed)
Plan of care 

## 2019-03-29 NOTE — Op Note (Signed)
NAME:  Angela Preston                      MEDICAL RECORD NO.:  [id number]                             FACILITY:  [facility]      PHYSICIAN:  Pietro Cassis. Alvan Dame, M.D.  DATE OF BIRTH:  [birth year]      DATE OF PROCEDURE:  03/29/2019                                     OPERATIVE REPORT         PREOPERATIVE DIAGNOSIS:  Right knee osteoarthritis.      POSTOPERATIVE DIAGNOSIS:  Right knee osteoarthritis.      FINDINGS:  The patient was noted to have complete loss of cartilage and   bone-on-bone arthritis with associated osteophytes in the lateral and patellofemoral compartments of   the knee with a significant synovitis and associated effusion.  The patient had failed months of conservative treatment including medications, injection therapy, activity modification.     PROCEDURE:  Right total knee replacement.      COMPONENTS USED:  DePuy Attune rotating platform posterior stabilized knee   system, a size 4N femur, 3 tibia, size 8 mm PS AOX insert, and 35 anatomic patellar   button.      SURGEON:  Pietro Cassis. Alvan Dame, M.D.      ASSISTANT:  Danae Orleans, PA-C.      ANESTHESIA:  Regional and Spinal.      SPECIMENS:  None.      COMPLICATION:  None.      DRAINS:  None.  EBL: <100cc      TOURNIQUET TIME:   Total Tourniquet Time Documented: Thigh (Right) - 30 minutes Total: Thigh (Right) - 30 minutes  .      The patient was stable to the recovery room.      INDICATION FOR PROCEDURE:  Angela Preston is a 78 y.o. female patient of   mine.  The patient had been seen, evaluated, and treated for months conservatively in the   office with medication, activity modification, and injections.  The patient had   radiographic changes of bone-on-bone arthritis with endplate sclerosis and osteophytes noted.  Based on the radiographic changes and failed conservative measures, the patient   decided to proceed with definitive treatment, total knee replacement.  Risks of infection, DVT, component  failure, need for revision surgery, neurovascular injury were reviewed in the office setting.  The postop course was reviewed stressing the efforts to maximize post-operative satisfaction and function.  Consent was obtained for benefit of pain   relief.      PROCEDURE IN DETAIL:  The patient was brought to the operative theater.   Once adequate anesthesia, preoperative antibiotics, 2 gm of Ancef,1 gm of Tranexamic Acid, and 10 mg of Decadron administered, the patient was positioned supine with a right thigh tourniquet placed.  The  right lower extremity was prepped and draped in sterile fashion.  A time-   out was performed identifying the patient, planned procedure, and the appropriate extremity.      The right lower extremity was placed in the [facility] leg holder.  The leg was   exsanguinated, tourniquet elevated to 250 mmHg.  A midline incision was   made  followed by median parapatellar arthrotomy.  Following initial   exposure, attention was first directed to the patella.  Precut   measurement was noted to be 22 mm.  I resected down to 13 mm and used a   35 anatomic patellar button to restore patellar height as well as cover the cut surface.      The lug holes were drilled and a metal shim was placed to protect the   patella from retractors and saw blade during the procedure.      At this point, attention was now directed to the femur.  The femoral   canal was opened with a drill, irrigated to try to prevent fat emboli.  An   intramedullary rod was passed at 3 degrees valgus, 10 mm of bone was   resected off the distal femur.  Following this resection, the tibia was   subluxated anteriorly.  Using the extramedullary guide, 2 mm of bone was resected off   the proximal lateral tibia.  We confirmed the gap would be   stable medially and laterally with a size 6 spacer block as well as confirmed that the tibial cut was perpendicular in the coronal plane, checking with an alignment rod.       Once this was done, I sized the femur to be a size 4 in the anterior-   posterior dimension, chose a narrow component based on medial and   lateral dimension.  The size 4 rotation block was then pinned in   position anterior referenced using the C-clamp to set rotation.  The   anterior, posterior, and  chamfer cuts were made without difficulty nor   notching making certain that I was along the anterior cortex to help   with flexion gap stability.      The final box cut was made off the lateral aspect of distal femur.      At this point, the tibia was sized to be a size 3.  The size 3 tray was   then pinned in position through the medial third of the tubercle,   drilled, and keel punched.  Trial reduction was now carried with a 4 femur,  3 tibia, a size 8 mm PS insert, and the 35 anatomic patella botton.  The knee was brought to full extension with good flexion stability with the patella   tracking through the trochlea without application of pressure.  Given   all these findings the trial components removed.  Final components were   opened and cement was mixed.  The knee was irrigated with normal saline solution and pulse lavage.  The synovial lining was   then injected with 30 cc of 0.25% Marcaine with epinephrine, 1 cc of Toradol and 30 cc of NS for a total of 61 cc.     Final implants were then cemented onto cleaned and dried cut surfaces of bone with the knee brought to extension with a size 8 mm PS trial insert.      Once the cement had fully cured, excess cement was removed   throughout the knee.  I confirmed that I was satisfied with the range of   motion and stability, and the final size 8 mm PS AOX insert was chosen.  It was   placed into the knee.      The tourniquet had been let down at 30 minutes.  No significant   hemostasis was required.  The extensor mechanism was then reapproximated using #1 Vicryl and #  1 Stratafix sutures with the knee   in flexion.  The   remaining wound  was closed with 2-0 Vicryl and running 4-0 Monocryl.   The knee was cleaned, dried, dressed sterilely using Dermabond and   Aquacel dressing.  The patient was then   brought to recovery room in stable condition, tolerating the procedure   well.   Please note that Physician Assistant, Danae Orleans, PA-C was present for the entirety of the case, and was utilized for pre-operative positioning, peri-operative retractor management, general facilitation of the procedure and for primary wound closure at the end of the case.              Pietro Cassis Alvan Dame, M.D.    03/29/2019 11:18 AM

## 2019-03-29 NOTE — Care Plan (Signed)
Ortho Bundle Case Management Note  Patient Details  Name: Angela Preston MRN: [id number] Date of Birth: [birth year]  R TKA on 03-29-19 DCP:  Home with spouse and granddtr.  1 story home with 3 ste. DME:  No needs. Has a RW and 3-in-1. PT:  [facility].  Eval scheduled on 04-01-19 at 1:00 pm.                    DME Arranged:    DME Agency:     HH Arranged:    HH Agency:     Additional Comments: Please contact me with any questions of if this plan should need to change.    03/29/2019, 3:20 PM

## 2019-03-29 NOTE — Transfer of Care (Signed)
Immediate Anesthesia Transfer of Care Note  Patient: Jodene Polyak Upham  Procedure(s) Performed: TOTAL KNEE ARTHROPLASTY (Right Knee)  Patient Location: PACU  Anesthesia Type:MAC, Regional, Spinal and MAC combined with regional for post-op pain  Level of Consciousness: awake, sedated and patient cooperative  Airway & Oxygen Therapy: Patient Spontanous Breathing and Patient connected to face mask oxygen  Post-op Assessment: Report given to RN and Post -op Vital signs reviewed and stable  Post vital signs: stable  Last Vitals:  Vitals Value Taken Time  BP 117/63 03/29/19 1147  Temp    Pulse 62 03/29/19 1148  Resp 18 03/29/19 1148  SpO2 100 % 03/29/19 1148  Vitals shown include unvalidated device data.  Last Pain:  Vitals:   03/29/19 0741  TempSrc: Oral      Patients Stated Pain Goal: 3 (16/24/46 9507)  Complications: No apparent anesthesia complications

## 2019-03-29 NOTE — Interval H&P Note (Signed)
History and Physical Interval Note:  03/29/2019 8:54 AM  Angela Preston  has presented today for surgery, with the diagnosis of Right knee osteoarthritis.  The various methods of treatment have been discussed with the patient and family. After consideration of risks, benefits and other options for treatment, the patient has consented to  Procedure(s): TOTAL KNEE ARTHROPLASTY (Right) as a surgical intervention.  The patient's history has been reviewed, patient examined, no change in status, stable for surgery.  I have reviewed the patient's chart and labs.  Questions were answered to the patient's satisfaction.     Mauri Pole

## 2019-03-30 DIAGNOSIS — M1711 Unilateral primary osteoarthritis, right knee: Secondary | ICD-10-CM | POA: Diagnosis not present

## 2019-03-30 DIAGNOSIS — I129 Hypertensive chronic kidney disease with stage 1 through stage 4 chronic kidney disease, or unspecified chronic kidney disease: Secondary | ICD-10-CM | POA: Diagnosis not present

## 2019-03-30 DIAGNOSIS — E785 Hyperlipidemia, unspecified: Secondary | ICD-10-CM | POA: Diagnosis not present

## 2019-03-30 DIAGNOSIS — M810 Age-related osteoporosis without current pathological fracture: Secondary | ICD-10-CM | POA: Diagnosis not present

## 2019-03-30 DIAGNOSIS — K219 Gastro-esophageal reflux disease without esophagitis: Secondary | ICD-10-CM | POA: Diagnosis not present

## 2019-03-30 DIAGNOSIS — N183 Chronic kidney disease, stage 3 unspecified: Secondary | ICD-10-CM | POA: Diagnosis not present

## 2019-03-30 LAB — BASIC METABOLIC PANEL
Anion gap: 9 (ref 5–15)
BUN: 21 mg/dL (ref 8–23)
CO2: 26 mmol/L (ref 22–32)
Calcium: 8.4 mg/dL — ABNORMAL LOW (ref 8.9–10.3)
Chloride: 101 mmol/L (ref 98–111)
Creatinine, Ser: 1.01 mg/dL — ABNORMAL HIGH (ref 0.44–1.00)
GFR calc Af Amer: >60 mL/min (ref >60)
GFR calc non Af Amer: 53 mL/min — ABNORMAL LOW (ref >60)
Glucose, Bld: 169 mg/dL — ABNORMAL HIGH (ref 70–99)
Potassium: 3.5 mmol/L (ref 3.5–5.1)
Sodium: 136 mmol/L (ref 135–145)

## 2019-03-30 LAB — CBC
HCT: 35.4 % — ABNORMAL LOW (ref 36.0–46.0)
Hemoglobin: 11.4 g/dL — ABNORMAL LOW (ref 12.0–15.0)
MCH: 31.1 pg (ref 26.0–34.0)
MCHC: 32.2 g/dL (ref 30.0–36.0)
MCV: 96.5 fL (ref 80.0–100.0)
Platelets: 272 10*3/uL (ref 150–400)
RBC: 3.67 MIL/uL — ABNORMAL LOW (ref 3.87–5.11)
RDW: 12.2 % (ref 11.5–15.5)
WBC: 16 10*3/uL — ABNORMAL HIGH (ref 4.0–10.5)
nRBC: 0 % (ref 0.0–0.2)

## 2019-03-30 MED ORDER — RIVAROXABAN 10 MG PO TABS: 10.0000 mg | ORAL_TABLET | Freq: Every day | ORAL | 0 refills | Status: DC

## 2019-03-30 MED ORDER — DOCUSATE SODIUM 100 MG PO CAPS: 100.0000 mg | ORAL_CAPSULE | Freq: Two times a day (BID) | ORAL | 0 refills | Status: DC

## 2019-03-30 MED ORDER — METHOCARBAMOL 500 MG PO TABS: 500.0000 mg | ORAL_TABLET | Freq: Four times a day (QID) | ORAL | 0 refills | Status: DC | PRN

## 2019-03-30 MED ORDER — POLYETHYLENE GLYCOL 3350 17 G PO PACK: 17.0000 g | PACK | Freq: Two times a day (BID) | ORAL | 0 refills | Status: DC

## 2019-03-30 MED ORDER — FERROUS SULFATE 325 (65 FE) MG PO TABS: 325.0000 mg | ORAL_TABLET | Freq: Three times a day (TID) | ORAL | 0 refills | Status: DC

## 2019-03-30 MED ORDER — HYDROCODONE-ACETAMINOPHEN 5-325 MG PO TABS: 1.0000 | ORAL_TABLET | ORAL | 0 refills | Status: DC | PRN

## 2019-03-30 NOTE — Progress Notes (Signed)
     Subjective: 1 Day Post-Op Procedure(s) (LRB): TOTAL KNEE ARTHROPLASTY (Right)   Patient reports pain as mild, pain controlled with medication.  No reported events throughout the night.  We discussed the procedure, findings and expectations moving forward.  We discussed the use of Xarelto and attempting to prevent DVT/PE in the postop period.  Patient feels like she would like to be discharged home today as long as she does well with therapy.  Patient will follow-up in the clinic in 2 weeks.  Patient knows to call with any questions or concerns.   Patient's anticipated LOS is less than 2 midnights, meeting these requirements: - Lives within 1 hour of care - Has a competent adult at home to recover with post-op recover - NO history of  - Chronic pain requiring opiods  - Diabetes  - Coronary Artery Disease  - Heart failure  - Stroke  - Respiratory Failure/COPD  - Renal failure  - Anemia  - Advanced Liver disease     Objective:   VITALS:   Vitals:   03/30/19 0505 03/30/19 0855  BP: (!) 105/56 114/61  Pulse: (!) 59 (!) 57  Resp: 14 15  Temp: 98 F (36.7 C) 97.6 F (36.4 C)  SpO2: 100% 100%    Dorsiflexion/Plantar flexion intact Incision: dressing C/D/I No cellulitis present Compartment soft  LABS Recent Labs    03/30/19 0158  HGB 11.4*  HCT 35.4*  WBC 16.0*  PLT 272    Recent Labs    03/30/19 0158  NA 136  K 3.5  BUN 21  CREATININE 1.01*  GLUCOSE 169*     Assessment/Plan: 1 Day Post-Op Procedure(s) (LRB): TOTAL KNEE ARTHROPLASTY (Right) Foley cath d/c'ed Advance diet Up with therapy D/C IV fluids Discharge home Follow up in 2 weeks at [facility] Follow up with OLIN,Teleah Villamar D in 2 weeks.  Contact information:  [facility] [address] [place] Eryc Bodey   PAC  03/30/2019, 9:32 AM

## 2019-03-30 NOTE — Progress Notes (Signed)
Physical Therapy Treatment Patient Details Name: Angela Preston MRN: [id number] DOB: 07-23-1940 Today's Date: 03/30/2019    History of Present Illness R TKA; PMH of interstitial lung disease, L THA revision, CKD, MI, DVT    PT Comments    Pt is progressing well with mobility and is ready to DC home from PT standpoint. She ambulated 100' with RW, completed stair training, and demonstrates good understanding of TKA HEP.   Follow Up Recommendations  Follow surgeon's recommendation for DC plan and follow-up therapies     Equipment Recommendations  None recommended by PT    Recommendations for Other Services       Precautions / Restrictions Precautions Precautions: Knee Precaution Booklet Issued: Yes (comment) Precaution Comments: reviewed no pillow under knee Restrictions Weight Bearing Restrictions: No Other Position/Activity Restrictions: WBAT    Mobility  Bed Mobility Overal bed mobility: Needs Assistance Bed Mobility: Supine to Sit     Supine to sit: Supervision;HOB elevated        Transfers Overall transfer level: Needs assistance Equipment used: Rolling walker (2 wheeled) Transfers: Sit to/from Stand Sit to Stand: Min guard         General transfer comment: VCs hand placement  Ambulation/Gait Ambulation/Gait assistance: Min guard Gait Distance (Feet): 100 Feet Assistive device: Rolling walker (2 wheeled) Gait Pattern/deviations: Step-to pattern;Decreased stride length;Decreased weight shift to right Gait velocity: decr   General Gait Details: VCs sequencing, posture,  and positioning in RW, distance limited by fatigue/pain   Stairs Stairs: Yes Stairs assistance: Min guard Stair Management: Two rails;Forwards;Step to pattern Number of Stairs: 3 General stair comments: VCs sequencing   Wheelchair Mobility    Modified Rankin (Stroke Patients Only)       Balance Overall balance assessment: Modified Independent                                          Cognition Arousal/Alertness: Awake/alert Behavior During Therapy: WFL for tasks assessed/performed Overall Cognitive Status: Within Functional Limits for tasks assessed                                        Exercises Total Joint Exercises Ankle Circles/Pumps: AROM;Both;10 reps;Supine Quad Sets: AROM;Right;5 reps;Supine Short Arc Quad: AROM;Right;10 reps;Supine Heel Slides: AAROM;Right;Supine;10 reps Hip ABduction/ADduction: AROM;Right;10 reps Straight Leg Raises: AROM;Right;5 reps;Supine Long Arc Quad: AROM;Right;Seated;10 reps Knee Flexion: AROM;AAROM;Right;10 reps;Seated Goniometric ROM: 0-75* AAROM R knee    General Comments        Pertinent Vitals/Pain Pain Score: 5  Pain Location: R knee Pain Descriptors / Indicators: Sore Pain Intervention(s): Limited activity within patient's tolerance;Monitored during session;Premedicated before session;Ice applied    Home Living                      Prior Function            PT Goals (current goals can now be found in the care plan section) Acute Rehab PT Goals Patient Stated Goal: to be able to walk, [place] PT Goal Formulation: With patient/family Time For Goal Achievement: 04/05/19 Potential to Achieve Goals: Good Progress towards PT goals: Progressing toward goals    Frequency    7X/week      PT Plan Current plan remains appropriate    Co-evaluation  AM-PAC PT "6 Clicks" Mobility   Outcome Measure  Help needed turning from your back to your side while in a flat bed without using bedrails?: A Little Help needed moving from lying on your back to sitting on the side of a flat bed without using bedrails?: A Little Help needed moving to and from a bed to a chair (including a wheelchair)?: None Help needed standing up from a chair using your arms (e.g., wheelchair or bedside chair)?: None Help needed to walk in hospital  room?: None Help needed climbing 3-5 steps with a railing? : A Little 6 Click Score: 21    End of Session Equipment Utilized During Treatment: Gait belt Activity Tolerance: Patient tolerated treatment well Patient left: in chair;with call bell/phone within reach;with chair alarm set Nurse Communication: Mobility status PT Visit Diagnosis: Difficulty in walking, not elsewhere classified (R26.2);Pain Pain - Right/Left: Right Pain - part of body: Knee     Time: 0936-1010 PT Time Calculation (min) (ACUTE ONLY): 34 min  Charges:  $Gait Training: 8-22 mins $Therapeutic Exercise: 8-22 mins                    Blondell Reveal Kistler PT 03/30/2019  Acute Rehabilitation Services Pager [phone] Office [phone]

## 2019-03-31 ENCOUNTER — Encounter (HOSPITAL_COMMUNITY): Payer: Self-pay | Admitting: Orthopedic Surgery
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
## 2019-04-05 DIAGNOSIS — Z96651 Presence of right artificial knee joint: Secondary | ICD-10-CM | POA: Diagnosis not present

## 2019-04-07 DIAGNOSIS — Z96651 Presence of right artificial knee joint: Secondary | ICD-10-CM | POA: Diagnosis not present

## 2019-04-12 DIAGNOSIS — Z96651 Presence of right artificial knee joint: Secondary | ICD-10-CM | POA: Diagnosis not present

## 2019-04-13 ENCOUNTER — Other Ambulatory Visit: Payer: Self-pay

## 2019-04-19 DIAGNOSIS — Z96651 Presence of right artificial knee joint: Secondary | ICD-10-CM | POA: Diagnosis not present

## 2019-04-21 DIAGNOSIS — Z96651 Presence of right artificial knee joint: Secondary | ICD-10-CM | POA: Diagnosis not present

## 2019-04-28 DIAGNOSIS — Z96651 Presence of right artificial knee joint: Secondary | ICD-10-CM | POA: Diagnosis not present

## 2019-04-29 DIAGNOSIS — Z96651 Presence of right artificial knee joint: Secondary | ICD-10-CM | POA: Diagnosis not present

## 2019-05-03 DIAGNOSIS — Z96651 Presence of right artificial knee joint: Secondary | ICD-10-CM | POA: Diagnosis not present

## 2019-05-23 ENCOUNTER — Other Ambulatory Visit: Payer: Self-pay | Admitting: Cardiovascular Disease

## 2019-05-24 DIAGNOSIS — Z96651 Presence of right artificial knee joint: Secondary | ICD-10-CM | POA: Diagnosis not present

## 2019-05-26 DIAGNOSIS — Z96651 Presence of right artificial knee joint: Secondary | ICD-10-CM | POA: Diagnosis not present

## 2019-05-31 DIAGNOSIS — Z23 Encounter for immunization: Secondary | ICD-10-CM | POA: Diagnosis not present

## 2019-07-01 DIAGNOSIS — Z23 Encounter for immunization: Secondary | ICD-10-CM | POA: Diagnosis not present

## 2019-07-09 ENCOUNTER — Other Ambulatory Visit (HOSPITAL_COMMUNITY): Payer: Medicare Other

## 2019-07-16 ENCOUNTER — Other Ambulatory Visit (HOSPITAL_COMMUNITY)
Admission: RE | Admit: 2019-07-16 | Discharge: 2019-07-16 | Disposition: A | Discharge status: Home / Self Care | Payer: Medicare Other | Source: Ambulatory Visit | Attending: Pulmonary Disease | Admitting: Pulmonary Disease

## 2019-07-16 DIAGNOSIS — Z20822 Contact with and (suspected) exposure to covid-19: Secondary | ICD-10-CM | POA: Insufficient documentation

## 2019-07-16 DIAGNOSIS — Z01812 Encounter for preprocedural laboratory examination: Secondary | ICD-10-CM | POA: Insufficient documentation

## 2019-07-16 LAB — SARS CORONAVIRUS 2 (TAT 6-24 HRS): SARS Coronavirus 2: NEGATIVE

## 2019-07-19 ENCOUNTER — Ambulatory Visit (INDEPENDENT_AMBULATORY_CARE_PROVIDER_SITE_OTHER): Payer: Medicare Other | Admitting: Pulmonary Disease

## 2019-07-19 ENCOUNTER — Encounter: Payer: Self-pay | Admitting: Primary Care

## 2019-07-19 ENCOUNTER — Other Ambulatory Visit: Payer: Self-pay

## 2019-07-19 ENCOUNTER — Ambulatory Visit (INDEPENDENT_AMBULATORY_CARE_PROVIDER_SITE_OTHER): Payer: Medicare Other | Admitting: Primary Care

## 2019-07-19 DIAGNOSIS — J849 Interstitial pulmonary disease, unspecified: Secondary | ICD-10-CM

## 2019-07-19 LAB — PULMONARY FUNCTION TEST
DL/VA % pred: 112 %
DL/VA: 4.62 ml/min/mmHg/L
DLCO cor % pred: 74 %
DLCO cor: 13.52 ml/min/mmHg
DLCO unc % pred: 74 %
DLCO unc: 13.52 ml/min/mmHg
FEF 25-75 Post: 3.72 L/sec
FEF 25-75 Pre: 3.66 L/sec
FEF2575-%Change-Post: 1 %
FEF2575-%Pred-Post: 274 %
FEF2575-%Pred-Pre: 271 %
FEV1-%Change-Post: 1 %
FEV1-%Pred-Post: 102 %
FEV1-%Pred-Pre: 101 %
FEV1-Post: 1.9 L
FEV1-Pre: 1.87 L
FEV1FVC-%Change-Post: 0 %
FEV1FVC-%Pred-Pre: 121 %
FEV6-%Change-Post: 1 %
FEV6-%Pred-Post: 89 %
FEV6-%Pred-Pre: 88 %
FEV6-Post: 2.1 L
FEV6-Pre: 2.07 L
FEV6FVC-%Change-Post: 0 %
FEV6FVC-%Pred-Post: 105 %
FEV6FVC-%Pred-Pre: 105 %
FVC-%Change-Post: 1 %
FVC-%Pred-Post: 84 %
FVC-%Pred-Pre: 83 %
FVC-Post: 2.11 L
FVC-Pre: 2.08 L
Post FEV1/FVC ratio: 90 %
Post FEV6/FVC ratio: 99 %
Pre FEV1/FVC ratio: 90 %
Pre FEV6/FVC Ratio: 100 %
RV % pred: 74 %
RV: 1.75 L
TLC % pred: 77 %
TLC: 3.79 L

## 2019-07-19 NOTE — Patient Instructions (Addendum)
Pleasure meeting you today Ms Arnaud  Your pulmonary function testing looked normal with mild diffusion defect. No change since previous one in June 2020  Orders: - Due for repeat high resolution CT chest to monitor ILD - PFTs in 1 year with DLCO   Follow-up: - 6-12 months with Dr. Vaughan Browner OR sooner if symptoms worsen/progress

## 2019-07-19 NOTE — Progress Notes (Signed)
Full PFT performed today. °

## 2019-07-19 NOTE — Assessment & Plan Note (Addendum)
-   Minimally symptomatic; DOE with stairs or long distance  - Pulmonary function testing today was stable with no changes in diffusion capacity or lung function  - Due for repeat HRCT - Follow-up annually with PFTs and monitor symptoms  - If symptoms worsen or there is change in imaging/spirometry consider lung biopsy or bronchoscopy with emvisia classifier
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
## 2019-08-03 ENCOUNTER — Other Ambulatory Visit: Payer: Self-pay

## 2019-08-03 ENCOUNTER — Ambulatory Visit (HOSPITAL_BASED_OUTPATIENT_CLINIC_OR_DEPARTMENT_OTHER)
Admission: RE | Admit: 2019-08-03 | Discharge: 2019-08-03 | Disposition: A | Discharge status: Home / Self Care | Payer: Medicare Other | Source: Ambulatory Visit | Attending: Primary Care | Admitting: Primary Care

## 2019-08-03 DIAGNOSIS — J479 Bronchiectasis, uncomplicated: Secondary | ICD-10-CM | POA: Diagnosis not present

## 2019-08-03 DIAGNOSIS — J849 Interstitial pulmonary disease, unspecified: Secondary | ICD-10-CM | POA: Insufficient documentation

## 2019-08-03 NOTE — Progress Notes (Signed)
Please let patient know HRCT showed No significant progression of disease compared to prior study 07/19/2018.   Also showed aortic atherosclerosis, coronary artery disease and small hiatal hernia. Non-urgent findings, follow up with PCP

## 2019-08-04 ENCOUNTER — Telehealth: Payer: Self-pay | Admitting: Pulmonary Disease

## 2019-08-04 NOTE — Telephone Encounter (Signed)
Pt is requesting results from her most recent CT scan.  Beth - please advise. Thanks.

## 2019-08-05 NOTE — Telephone Encounter (Signed)
I sent a result note to my nurse.   "Please let patient know HRCT showed No significant progression of disease compared to prior study 07/19/2018.   Also showed aortic atherosclerosis, coronary artery disease and small hiatal hernia. Non-urgent findings, follow up with PCP"

## 2019-08-05 NOTE — Telephone Encounter (Signed)
Pt called again-- She is requesting results information, please return call when able [phone] or [phone]

## 2019-08-05 NOTE — Telephone Encounter (Signed)
Spoke with pt, aware of results/recs.  Nothing further needed.  

## 2019-09-12 ENCOUNTER — Other Ambulatory Visit (HOSPITAL_BASED_OUTPATIENT_CLINIC_OR_DEPARTMENT_OTHER): Payer: Self-pay | Admitting: Family Medicine

## 2019-09-12 DIAGNOSIS — Z1231 Encounter for screening mammogram for malignant neoplasm of breast: Secondary | ICD-10-CM

## 2019-10-10 ENCOUNTER — Other Ambulatory Visit: Payer: Self-pay | Admitting: Cardiovascular Disease

## 2019-10-10 ENCOUNTER — Other Ambulatory Visit: Payer: Self-pay

## 2019-10-10 ENCOUNTER — Ambulatory Visit (HOSPITAL_BASED_OUTPATIENT_CLINIC_OR_DEPARTMENT_OTHER)
Admission: RE | Admit: 2019-10-10 | Discharge: 2019-10-10 | Disposition: A | Discharge status: Home / Self Care | Payer: Medicare Other | Source: Ambulatory Visit | Attending: Family Medicine | Admitting: Family Medicine

## 2019-10-10 DIAGNOSIS — Z1231 Encounter for screening mammogram for malignant neoplasm of breast: Secondary | ICD-10-CM | POA: Insufficient documentation

## 2019-10-10 NOTE — Telephone Encounter (Signed)
Rx request sent to pharmacy.  

## 2019-10-11 ENCOUNTER — Telehealth: Payer: Self-pay | Admitting: Family Medicine

## 2019-10-11 NOTE — Telephone Encounter (Signed)
Please inform patient the following information: Mammogram is normal.   

## 2019-10-11 NOTE — Telephone Encounter (Signed)
Pt was called and given results, she verbalized understanding

## 2019-11-28 ENCOUNTER — Other Ambulatory Visit: Payer: Self-pay | Admitting: Cardiovascular Disease

## 2019-11-30 NOTE — Telephone Encounter (Signed)
Rx(s) sent to pharmacy electronically. OV 12/15/19 with Dr. Claiborne Billings

## 2019-12-15 ENCOUNTER — Ambulatory Visit: Payer: Medicare Other | Admitting: Cardiovascular Disease

## 2020-01-09 ENCOUNTER — Emergency Department (HOSPITAL_BASED_OUTPATIENT_CLINIC_OR_DEPARTMENT_OTHER): Payer: Medicare Other

## 2020-01-09 ENCOUNTER — Emergency Department (HOSPITAL_BASED_OUTPATIENT_CLINIC_OR_DEPARTMENT_OTHER)
Admission: EM | Admit: 2020-01-09 | Discharge: 2020-01-09 | Disposition: A | Discharge status: Home / Self Care | Payer: Medicare Other | Attending: Emergency Medicine | Admitting: Emergency Medicine

## 2020-01-09 ENCOUNTER — Other Ambulatory Visit: Payer: Self-pay

## 2020-01-09 ENCOUNTER — Encounter (HOSPITAL_BASED_OUTPATIENT_CLINIC_OR_DEPARTMENT_OTHER): Payer: Self-pay

## 2020-01-09 DIAGNOSIS — I708 Atherosclerosis of other arteries: Secondary | ICD-10-CM | POA: Diagnosis not present

## 2020-01-09 DIAGNOSIS — Z79899 Other long term (current) drug therapy: Secondary | ICD-10-CM | POA: Insufficient documentation

## 2020-01-09 DIAGNOSIS — W19XXXA Unspecified fall, initial encounter: Secondary | ICD-10-CM

## 2020-01-09 DIAGNOSIS — N183 Chronic kidney disease, stage 3 unspecified: Secondary | ICD-10-CM | POA: Diagnosis not present

## 2020-01-09 DIAGNOSIS — N281 Cyst of kidney, acquired: Secondary | ICD-10-CM | POA: Diagnosis not present

## 2020-01-09 DIAGNOSIS — I129 Hypertensive chronic kidney disease with stage 1 through stage 4 chronic kidney disease, or unspecified chronic kidney disease: Secondary | ICD-10-CM | POA: Insufficient documentation

## 2020-01-09 DIAGNOSIS — R109 Unspecified abdominal pain: Secondary | ICD-10-CM | POA: Diagnosis not present

## 2020-01-09 DIAGNOSIS — I700 Atherosclerosis of aorta: Secondary | ICD-10-CM | POA: Diagnosis not present

## 2020-01-09 DIAGNOSIS — K449 Diaphragmatic hernia without obstruction or gangrene: Secondary | ICD-10-CM | POA: Diagnosis not present

## 2020-01-09 DIAGNOSIS — M545 Low back pain, unspecified: Secondary | ICD-10-CM

## 2020-01-09 MED ORDER — OXYCODONE-ACETAMINOPHEN 5-325 MG PO TABS
1.0000 | ORAL_TABLET | Freq: Once | ORAL | Status: AC
  Administered 2020-01-09: 1 via ORAL
  Filled 2020-01-09: qty 1

## 2020-01-09 MED ORDER — LIDOCAINE 5 % EX PTCH: 1.0000 | MEDICATED_PATCH | CUTANEOUS | 0 refills | Status: DC

## 2020-01-09 MED ORDER — CYCLOBENZAPRINE HCL 10 MG PO TABS: 10.0000 mg | ORAL_TABLET | Freq: Two times a day (BID) | ORAL | 0 refills | Status: DC | PRN

## 2020-01-09 MED FILL — CYCLOBENZAPRINE HCL 10 MG T: 10 | 10 days supply | Qty: 20 | Fill #0

## 2020-01-09 MED FILL — LIDOCAINE PATCH 5%: 5 | 30 days supply | Qty: 30 | Fill #0

## 2020-01-09 NOTE — ED Triage Notes (Signed)
Pt arrives with reports of fall yesterday hitting her left side on a chest of drawers. Pt denies LOC, denies blood thinner.
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
## 2020-01-16 ENCOUNTER — Ambulatory Visit (INDEPENDENT_AMBULATORY_CARE_PROVIDER_SITE_OTHER): Payer: Medicare Other | Admitting: Pulmonary Disease

## 2020-01-16 ENCOUNTER — Encounter: Payer: Self-pay | Admitting: Pulmonary Disease

## 2020-01-16 ENCOUNTER — Other Ambulatory Visit: Payer: Self-pay

## 2020-01-16 VITALS — BP 116/70 | HR 87 | Temp 98.1°F | Ht 61.0 in | Wt 151.0 lb

## 2020-01-16 DIAGNOSIS — J849 Interstitial pulmonary disease, unspecified: Secondary | ICD-10-CM | POA: Diagnosis not present

## 2020-01-16 NOTE — Patient Instructions (Addendum)
I am glad you are doing well with regard to your breathing.   We will get follow-up studies including high-res CT, full PFTs and a 6-minute walk test in March 2022 Follow-up in clinic after these tests.

## 2020-01-16 NOTE — Progress Notes (Signed)
Angela Preston    [id number]    [birth year]  Primary Care 20, Reinaldo Raddle, DO  Referring Physician: Ma Hillock, DO [address] [place],  [place] [address]  Chief complaint: Follow up for pulmonary embolism, interstitial lung disease  HPI: 79 year old with history of Takotsubo cardiomyopathy, hyperlipidemia, hypertension, recent PE, DVT  Underwent left hip arthroplasty September 2019 and susequentlly developed left leg swelling which worsened.  A lower extremity Doppler on 1/7 showed DVT and placed on Xarelto.  Had a primary care visit with cough, chest congestion.  Noted to have bilateral crackles.  Was treated with Omnicef and Hycodan cough syrup but continues to have persistent symptoms. A CTA obtained showed a small nonocclusive thrombus in the right upper lobe and changes concerning for interstitial lung disease and she has been referred to pulmonary for further evaluation.  Chief complaint today is cough, nonproductive in nature.  Mild dyspnea on exertion.  No wheezing, fevers, chills.  She has occasional dysphagia, food getting stuck on swallowing.  Also complains of dry mouth, dry eyes and acid reflux.  Pets: Outside cat, no dogs, birds, farm animals Occupation: Lawyer for [employer], worked as a Insurance claims handler.  Currently retired. Exposures: No known exposures, no mold, hot tub, Jacuzzi, humidifier Smoking history: Never smoker Travel history: No significant travel history Relevant family history: Brothers and father have emphysema, COPD.  They are all smokers.  Her younger brother is currently hospitalized with spontaneous pneumothorax.  Father had lung issues.  Unclear if he had fibrosis.  Interim history: She is here for follow-up States that breathing is doing well with no issues  Outpatient Encounter Medications as of 01/16/2020  Medication Sig  . carboxymethylcellulose (REFRESH PLUS) 0.5 % SOLN Place 1 drop into both eyes daily.  .  Cholecalciferol (VITAMIN D3) 250 MCG (10000 UT) capsule Take 10,000 Units by mouth every evening.  . cyclobenzaprine (FLEXERIL) 10 MG tablet Take 1 tablet (10 mg total) by mouth 2 (two) times daily as needed for muscle spasms.  Marland Kitchen ezetimibe (ZETIA) 10 MG tablet Take 1 tablet (10 mg total) by mouth daily.  Marland Kitchen lidocaine (LIDODERM) 5 % Place 1 patch onto the skin daily. Remove & Discard patch within 12 hours or as directed by MD  . Multiple Vitamins-Minerals (MULTIVITAMIN WITH MINERALS) tablet Take 1 tablet by mouth daily with lunch.   . Omega-3 Fatty Acids (FISH OIL) 1000 MG CAPS Take 1,000 mg by mouth 2 (two) times daily.   Marland Kitchen triamterene-hydrochlorothiazide (MAXZIDE) 75-50 MG tablet TAKE 1 TABLET BY MOUTH DAILY  . ferrous sulfate (FERROUSUL) 325 (65 FE) MG tablet Take 1 tablet (325 mg total) by mouth 3 (three) times daily with meals for 14 days.   No facility-administered encounter medications on file as of 01/16/2020.   Physical Exam: Blood pressure 136/78, pulse 74, height 5\' 1"  (1.549 m), weight 144 lb 3.2 oz (65.4 kg), SpO2 96 %. Gen:      No acute distress HEENT:  EOMI, sclera anicteric Neck:     No masses; no thyromegaly Lungs:    Clear to auscultation bilaterally; normal respiratory effort CV:         Regular rate and rhythm; no murmurs Abd:      + bowel sounds; soft, non-tender; no palpable masses, no distension Ext:    No edema; adequate peripheral perfusion Skin:      Warm and dry; no rash Neuro: alert and oriented x 3 Psych: normal  mood and affect   Data Reviewed: Imaging: Lower extremity ultrasound 05/26/2018- acute DVT in the left leg  CTA 06/30/2018- small nonocclusive pulmonary embolism in the right upper lobe, aortic, coronary atherosclerosis, patchy areas of peripheral groundglass and septal thickening bilaterally. I have reviewed the images personally.  High-resolution CT 07/19/2018- peripheral and basilar predominant subpleural groundglass, reticulation, traction  bronchiectasis.  No honeycombing.   PFTs 07/20/2018 FVC 2.12 [83%], FEV1 1.95 [103%), F/F 92, TLC 73%, DLCO corrected 134% Minimal restriction, increased diffusion defect.  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  Clinically she is asymtomatic  Had a detailed discussion with patient today and discussed options for management We can try for lung biopsy or bronchoscopy with envisia classifier or treat with antifibrotics She has opted for conservative management since she feels well and does not want to attempt biopsy or treatment.  After informed decision making we have agreed for a follow-up CT and PFTs in March 2022 If there is progression then we will strongly consider antifibrotics.  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

## 2020-02-16 ENCOUNTER — Telehealth: Payer: Self-pay | Admitting: Cardiovascular Disease

## 2020-02-16 NOTE — Telephone Encounter (Signed)
Pt calling today to review a genetic screening test sent to her in the mail. She states she received it in the mail with instructions to send in a saliva sample to screen for heart disease. She states the representative who called her said Dr. Claiborne Billings approved this test. I reviewed her past OV notes and did not see any discussion of her having a genetic screening performed. I did an IT consultant and found this genetic screening could possibly be a scam.   I advised her to not perform it unless she hears back from Dr. Evette Georges office that he does, in fact want to have her perform a genetic screening. I also advised her to take a phone number of a representative who contacts her and have a [facility] nurse call to clarify for her.   She verbalized understanding and had no additional needs at this time.

## 2020-02-16 NOTE — Telephone Encounter (Signed)
Patient states she received a kit from [employer]. She states they also sent a form to fill out and she is supposed to swab her cheek to check if her children may have heart problems. She states they told her it was approved by Dr. Claiborne Billings, but since she has not heard about it she would like to know if it was actually approved before she does anything.

## 2020-02-17 NOTE — Telephone Encounter (Signed)
Left message to call back  

## 2020-02-20 NOTE — Telephone Encounter (Signed)
Left message to call back  

## 2020-02-20 NOTE — Telephone Encounter (Signed)
Pt state she has returned the package because it was fraudulent.

## 2020-02-20 NOTE — Telephone Encounter (Signed)
Pt called back in returning call  To Leticia Penna number [phone]

## 2020-03-03 ENCOUNTER — Other Ambulatory Visit: Payer: Self-pay | Admitting: Cardiovascular Disease

## 2020-03-09 DIAGNOSIS — Z23 Encounter for immunization: Secondary | ICD-10-CM | POA: Diagnosis not present

## 2020-04-09 ENCOUNTER — Ambulatory Visit (INDEPENDENT_AMBULATORY_CARE_PROVIDER_SITE_OTHER): Payer: Medicare Other | Admitting: Cardiovascular Disease

## 2020-04-09 ENCOUNTER — Encounter: Payer: Self-pay | Admitting: Cardiovascular Disease

## 2020-04-09 VITALS — BP 140/84 | HR 73 | Ht 61.5 in | Wt 147.8 lb

## 2020-04-09 DIAGNOSIS — R600 Localized edema: Secondary | ICD-10-CM

## 2020-04-09 DIAGNOSIS — I700 Atherosclerosis of aorta: Secondary | ICD-10-CM | POA: Diagnosis not present

## 2020-04-09 DIAGNOSIS — E785 Hyperlipidemia, unspecified: Secondary | ICD-10-CM | POA: Diagnosis not present

## 2020-04-09 DIAGNOSIS — J849 Interstitial pulmonary disease, unspecified: Secondary | ICD-10-CM

## 2020-04-09 DIAGNOSIS — I5181 Takotsubo syndrome: Secondary | ICD-10-CM

## 2020-04-09 DIAGNOSIS — I10 Essential (primary) hypertension: Secondary | ICD-10-CM

## 2020-04-09 DIAGNOSIS — Z86718 Personal history of other venous thrombosis and embolism: Secondary | ICD-10-CM

## 2020-04-09 DIAGNOSIS — I2510 Atherosclerotic heart disease of native coronary artery without angina pectoris: Secondary | ICD-10-CM

## 2020-04-09 MED ORDER — LOSARTAN POTASSIUM 25 MG PO TABS: 25.0000 mg | ORAL_TABLET | Freq: Every day | ORAL | 3 refills | Status: DC

## 2020-04-09 NOTE — Patient Instructions (Signed)
Medication Instructions:  START TAKING 50 MG LOSARTAN ONE TABLET DAILY   *If you need a refill on your cardiac medications before your next appointment, please call your pharmacy*   Lab Work: FASTING CMP LIPID  CBC  TSH If you have labs (blood work) drawn today and your tests are completely normal, you will receive your results only by: Marland Kitchen MyChart Message (if you have MyChart) OR . A paper copy in the mail If you have any lab test that is abnormal or we need to change your treatment, we will call you to review the results.   Testing/Procedures: Will be schedule at [facility] has requested that you have an echocardiogram. Echocardiography is a painless test that uses sound waves to create images of your heart. It provides your doctor with information about the size and shape of your heart and how well your heart's chambers and valves are working. This procedure takes approximately one hour. There are no restrictions for this procedure.    Follow-Up: At [facility], you and your health needs are our priority.  As part of our continuing mission to provide you with exceptional heart care, we have created designated Provider Care Teams.  These Care Teams include your primary Cardiologist (physician) and Advanced Practice Providers (APPs -  Physician Assistants and Nurse Practitioners) who all work together to provide you with the care you need, when you need it.  We recommend signing up for the patient portal called "MyChart".  Sign up information is provided on this After Visit Summary.  MyChart is used to connect with patients for Virtual Visits (Telemedicine).  Patients are able to view lab/test results, encounter notes, upcoming appointments, etc.  Non-urgent messages can be sent to your provider as well.   To learn more about what you can do with MyChart, go to [email or web address].    Your next appointment:   3 month(s)  The format for  your next appointment:   In Person  Provider:   Shelva Majestic, MD   Other Instructions
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
## 2020-04-11 ENCOUNTER — Encounter: Payer: Self-pay | Admitting: Cardiovascular Disease

## 2020-04-16 DIAGNOSIS — I2510 Atherosclerotic heart disease of native coronary artery without angina pectoris: Secondary | ICD-10-CM | POA: Diagnosis not present

## 2020-04-16 DIAGNOSIS — I10 Essential (primary) hypertension: Secondary | ICD-10-CM | POA: Diagnosis not present

## 2020-04-16 DIAGNOSIS — E785 Hyperlipidemia, unspecified: Secondary | ICD-10-CM | POA: Diagnosis not present

## 2020-04-16 DIAGNOSIS — I5181 Takotsubo syndrome: Secondary | ICD-10-CM | POA: Diagnosis not present

## 2020-04-17 LAB — LIPID PANEL
Chol/HDL Ratio: 5.9 ratio — ABNORMAL HIGH (ref 0.0–4.4)
Cholesterol, Total: 217 mg/dL — ABNORMAL HIGH (ref 100–199)
HDL: 37 mg/dL — ABNORMAL LOW (ref >39)
LDL Chol Calc (NIH): 92 mg/dL (ref 0–99)
Triglycerides: 536 mg/dL — ABNORMAL HIGH (ref 0–149)
VLDL Cholesterol Cal: 88 mg/dL — ABNORMAL HIGH (ref 5–40)

## 2020-04-17 LAB — COMPREHENSIVE METABOLIC PANEL
ALT: 16 IU/L (ref 0–32)
AST: 18 IU/L (ref 0–40)
Albumin/Globulin Ratio: 1.3 (ref 1.2–2.2)
Albumin: 4.3 g/dL (ref 3.7–4.7)
Alkaline Phosphatase: 96 IU/L (ref 44–121)
BUN/Creatinine Ratio: 25 (ref 12–28)
BUN: 22 mg/dL (ref 8–27)
Bilirubin Total: 0.3 mg/dL (ref 0.0–1.2)
CO2: 27 mmol/L (ref 20–29)
Calcium: 9.7 mg/dL (ref 8.7–10.3)
Chloride: 99 mmol/L (ref 96–106)
Creatinine, Ser: 0.89 mg/dL (ref 0.57–1.00)
GFR calc Af Amer: 71 mL/min/{1.73_m2} (ref >59)
GFR calc non Af Amer: 61 mL/min/{1.73_m2} (ref >59)
Globulin, Total: 3.2 g/dL (ref 1.5–4.5)
Glucose: 92 mg/dL (ref 65–99)
Potassium: 4.7 mmol/L (ref 3.5–5.2)
Sodium: 137 mmol/L (ref 134–144)
Total Protein: 7.5 g/dL (ref 6.0–8.5)

## 2020-04-17 LAB — CBC
Hematocrit: 40.4 % (ref 34.0–46.6)
Hemoglobin: 13.8 g/dL (ref 11.1–15.9)
MCH: 31.2 pg (ref 26.6–33.0)
MCHC: 34.2 g/dL (ref 31.5–35.7)
MCV: 91 fL (ref 79–97)
Platelets: 353 10*3/uL (ref 150–450)
RBC: 4.42 x10E6/uL (ref 3.77–5.28)
RDW: 11.7 % (ref 11.7–15.4)
WBC: 7.4 10*3/uL (ref 3.4–10.8)

## 2020-04-17 LAB — TSH: TSH: 5.21 u[IU]/mL — ABNORMAL HIGH (ref 0.450–4.500)

## 2020-04-18 DIAGNOSIS — Z23 Encounter for immunization: Secondary | ICD-10-CM | POA: Diagnosis not present

## 2020-04-23 ENCOUNTER — Other Ambulatory Visit: Payer: Self-pay

## 2020-04-23 ENCOUNTER — Ambulatory Visit (INDEPENDENT_AMBULATORY_CARE_PROVIDER_SITE_OTHER): Payer: Medicare Other | Admitting: Pulmonary Disease

## 2020-04-23 ENCOUNTER — Encounter: Payer: Self-pay | Admitting: Pulmonary Disease

## 2020-04-23 VITALS — BP 112/78 | HR 76 | Temp 98.3°F | Ht 61.0 in | Wt 147.0 lb

## 2020-04-23 DIAGNOSIS — J849 Interstitial pulmonary disease, unspecified: Secondary | ICD-10-CM

## 2020-04-23 NOTE — Progress Notes (Signed)
Angela Preston    [id number]    Aug 25, 1940  [facility], Reinaldo Raddle, DO  Referring Physician: Ma Hillock, DO [address] [place],  [place] [address]  Chief complaint: Follow up for pulmonary embolism, interstitial lung disease  HPI: 79 year old with history of Takotsubo cardiomyopathy, hyperlipidemia, hypertension, recent PE, DVT  Underwent left hip arthroplasty September 2019 and susequentlly developed left leg swelling which worsened.  A lower extremity Doppler on 1/7 showed DVT and placed on Xarelto.  Had a primary care visit with cough, chest congestion.  Noted to have bilateral crackles.  Was treated with Omnicef and Hycodan cough syrup but continues to have persistent symptoms. A CTA obtained showed a small nonocclusive thrombus in the right upper lobe and changes concerning for interstitial lung disease and she has been referred to pulmonary for further evaluation.  Chief complaint today is cough, nonproductive in nature.  Mild dyspnea on exertion.  No wheezing, fevers, chills.  She has occasional dysphagia, food getting stuck on swallowing.  Also complains of dry mouth, dry eyes and acid reflux.  Pets: Outside cat, no dogs, birds, farm animals Occupation: Lawyer for [employer], worked as a Insurance claims handler.  Currently retired. Exposures: No known exposures, no mold, hot tub, Jacuzzi, humidifier Smoking history: Never smoker Travel history: No significant travel history Relevant family history: Brothers and father have emphysema, COPD.  They are all smokers.  Her younger brother is currently hospitalized with spontaneous pneumothorax.  Father had lung issues.  Unclear if he had fibrosis.  Interim history: Breathing is doing well with no issues.  Outpatient Encounter Medications as of 04/23/2020  Medication Sig  . carboxymethylcellulose (REFRESH PLUS) 0.5 % SOLN Place 1 drop into both eyes daily.  . Cholecalciferol (VITAMIN D3) 250 MCG (10000  UT) capsule Take 10,000 Units by mouth every evening.  . ezetimibe (ZETIA) 10 MG tablet Take 1 tablet (10 mg total) by mouth daily.  Marland Kitchen lidocaine (LIDODERM) 5 % Place 1 patch onto the skin daily. Remove & Discard patch within 12 hours or as directed by MD  . Multiple Vitamins-Minerals (MULTIVITAMIN WITH MINERALS) tablet Take 1 tablet by mouth daily with lunch.   . Omega-3 Fatty Acids (FISH OIL) 1000 MG CAPS Take 1,000 mg by mouth 2 (two) times daily.   Marland Kitchen triamterene-hydrochlorothiazide (MAXZIDE) 75-50 MG tablet TAKE 1 TABLET BY MOUTH DAILY  . [DISCONTINUED] ferrous sulfate (FERROUSUL) 325 (65 FE) MG tablet Take 1 tablet (325 mg total) by mouth 3 (three) times daily with meals for 14 days.  . [DISCONTINUED] losartan (COZAAR) 25 MG tablet Take 1 tablet (25 mg total) by mouth daily.   No facility-administered encounter medications on file as of 04/23/2020.   Physical Exam: Blood pressure 112/78, pulse 76, temperature 98.3 F (36.8 C), temperature source Temporal, height 5\' 1"  (1.549 m), weight 147 lb (66.7 kg), SpO2 93 %. Gen:      No acute distress HEENT:  EOMI, sclera anicteric Neck:     No masses; no thyromegaly Lungs:    Bibasal crackles CV:         Regular rate and rhythm; no murmurs Abd:      + bowel sounds; soft, non-tender; no palpable masses, no distension Ext:    No edema; adequate peripheral perfusion Skin:      Warm and dry; no rash Neuro: alert and oriented x 3 Psych: normal mood and affect  Data Reviewed: Imaging: Lower extremity ultrasound 05/26/2018- acute  DVT in the left leg  CTA 06/30/2018- small nonocclusive pulmonary embolism in the right upper lobe, aortic, coronary atherosclerosis, patchy areas of peripheral groundglass and septal thickening bilaterally. I have reviewed the images personally.  High-resolution CT 07/19/2018- peripheral and basilar predominant subpleural groundglass, reticulation, traction bronchiectasis.  No honeycombing.   I have reviewed the images  personally.  PFTs 07/20/2018 FVC 2.12 [83%], FEV1 1.95 [103%), F/F 92, TLC 73%, DLCO corrected 134% Minimal restriction, increased diffusion defect.  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  Clinically she is asymtomatic  Had a detailed discussion with patient today and discussed options for management We can try for lung biopsy or bronchoscopy with envisia classifier or treat with antifibrotics She has opted for conservative management since she feels well and does not want to attempt biopsy or treatment.  After informed decision making we have agreed for a follow-up CT and PFTs in March 2022 If there is progression then we will strongly consider antifibrotics.  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 This appears to be chronic dating back to at least 2008  Plan/Recommendations: -High-res CT, PFTs and follow-up in March 2022  [name] MD [facility] 04/23/2020, 11:15 AM  CC: Raoul Pitch, Renee A, DO

## 2020-04-23 NOTE — Patient Instructions (Signed)
I am glad you are doing well with regard to breathing We will schedule high-res CT and PFTs in March 2022  Follow-up in clinic after these tests.

## 2020-04-26 ENCOUNTER — Other Ambulatory Visit: Payer: Self-pay

## 2020-04-26 MED ORDER — ICOSAPENT ETHYL 1 G PO CAPS: 2.0000 g | ORAL_CAPSULE | Freq: Two times a day (BID) | ORAL | 3 refills | Status: DC

## 2020-05-07 ENCOUNTER — Ambulatory Visit (HOSPITAL_COMMUNITY): Payer: Medicare Other | Attending: Cardiovascular Disease

## 2020-05-07 ENCOUNTER — Other Ambulatory Visit: Payer: Self-pay

## 2020-05-07 DIAGNOSIS — I5181 Takotsubo syndrome: Secondary | ICD-10-CM | POA: Diagnosis not present

## 2020-05-07 LAB — ECHOCARDIOGRAM COMPLETE
Area-P 1/2: 2.53 cm2
S' Lateral: 2 cm

## 2020-05-28 ENCOUNTER — Telehealth: Payer: Self-pay | Admitting: Pulmonary Disease

## 2020-05-28 NOTE — Telephone Encounter (Signed)
Called and spoke with Patient.  Patient stated her PFT is scheduled 07/05/20, with follow up afterwards with Dr. Vaughan Browner. HRCT is ordered for 07/2020. Patient requested HRCT be completed before her next OV.  LOV- 04/23/20- Instructions  I am glad you are doing well with regard to breathing We will schedule high-res CT and PFTs in March 2022  Follow-up in clinic after these tests.      Message routed to Dr. Vaughan Browner to advise on HRCT

## 2020-05-28 NOTE — Telephone Encounter (Signed)
Please reschedule CT scan to be done before office visit

## 2020-05-28 NOTE — Telephone Encounter (Signed)
Will route to [facility] to get patient schedule prior to office visit with Dr. Vaughan Browner on 07/06/19. Order should still be in for use

## 2020-05-28 NOTE — Telephone Encounter (Signed)
ATC Patient.  No answer and received no VM.   HRCT is ordered for 07/2020.

## 2020-05-29 NOTE — Telephone Encounter (Signed)
Appt scheduled 06/29/2020 @ 11pt aware of appt

## 2020-06-05 ENCOUNTER — Other Ambulatory Visit: Payer: Self-pay | Admitting: Cardiovascular Disease

## 2020-06-29 ENCOUNTER — Ambulatory Visit (HOSPITAL_BASED_OUTPATIENT_CLINIC_OR_DEPARTMENT_OTHER)
Admission: RE | Admit: 2020-06-29 | Discharge: 2020-06-29 | Disposition: A | Discharge status: Home / Self Care | Payer: Medicare Other | Source: Ambulatory Visit | Attending: Pulmonary Disease | Admitting: Pulmonary Disease

## 2020-06-29 ENCOUNTER — Other Ambulatory Visit: Payer: Self-pay

## 2020-06-29 DIAGNOSIS — J849 Interstitial pulmonary disease, unspecified: Secondary | ICD-10-CM | POA: Diagnosis present

## 2020-07-03 ENCOUNTER — Other Ambulatory Visit (HOSPITAL_COMMUNITY): Payer: Medicare Other

## 2020-07-03 ENCOUNTER — Other Ambulatory Visit (HOSPITAL_COMMUNITY)
Admission: RE | Admit: 2020-07-03 | Discharge: 2020-07-03 | Disposition: A | Discharge status: Home / Self Care | Payer: Medicare Other | Source: Ambulatory Visit | Attending: Pulmonary Disease | Admitting: Pulmonary Disease

## 2020-07-03 DIAGNOSIS — Z01812 Encounter for preprocedural laboratory examination: Secondary | ICD-10-CM | POA: Diagnosis present

## 2020-07-03 DIAGNOSIS — Z20822 Contact with and (suspected) exposure to covid-19: Secondary | ICD-10-CM | POA: Diagnosis not present

## 2020-07-03 LAB — SARS CORONAVIRUS 2 (TAT 6-24 HRS): SARS Coronavirus 2: NEGATIVE

## 2020-07-05 ENCOUNTER — Ambulatory Visit (INDEPENDENT_AMBULATORY_CARE_PROVIDER_SITE_OTHER): Payer: Medicare Other | Admitting: Pulmonary Disease

## 2020-07-05 ENCOUNTER — Encounter: Payer: Self-pay | Admitting: Pulmonary Disease

## 2020-07-05 ENCOUNTER — Other Ambulatory Visit: Payer: Self-pay

## 2020-07-05 VITALS — BP 136/78 | HR 69 | Temp 97.3°F | Ht 63.0 in | Wt 145.0 lb

## 2020-07-05 DIAGNOSIS — J849 Interstitial pulmonary disease, unspecified: Secondary | ICD-10-CM

## 2020-07-05 LAB — PULMONARY FUNCTION TEST
DL/VA % pred: 92 %
DL/VA: 3.8 ml/min/mmHg/L
DLCO cor % pred: 66 %
DLCO cor: 11.95 ml/min/mmHg
DLCO unc % pred: 66 %
DLCO unc: 11.95 ml/min/mmHg
FEF 25-75 Post: 3.84 L/sec
FEF 25-75 Pre: 3.63 L/sec
FEF2575-%Change-Post: 5 %
FEF2575-%Pred-Post: 295 %
FEF2575-%Pred-Pre: 279 %
FEV1-%Change-Post: 0 %
FEV1-%Pred-Post: 100 %
FEV1-%Pred-Pre: 99 %
FEV1-Post: 1.82 L
FEV1-Pre: 1.8 L
FEV1FVC-%Change-Post: 0 %
FEV1FVC-%Pred-Pre: 123 %
FEV6-%Change-Post: 0 %
FEV6-%Pred-Post: 85 %
FEV6-%Pred-Pre: 85 %
FEV6-Post: 1.97 L
FEV6-Pre: 1.97 L
FEV6FVC-%Change-Post: 0 %
FEV6FVC-%Pred-Post: 105 %
FEV6FVC-%Pred-Pre: 105 %
FVC-%Change-Post: 0 %
FVC-%Pred-Post: 81 %
FVC-%Pred-Pre: 81 %
FVC-Post: 2 L
FVC-Pre: 1.99 L
Post FEV1/FVC ratio: 91 %
Post FEV6/FVC ratio: 100 %
Pre FEV1/FVC ratio: 91 %
Pre FEV6/FVC Ratio: 100 %
RV % pred: 57 %
RV: 1.35 L
TLC % pred: 65 %
TLC: 3.23 L

## 2020-07-05 NOTE — Progress Notes (Addendum)
Angela Preston    [id number]    1940/10/10  [facility], Reinaldo Raddle, DO  Referring Physician: Ma Hillock, DO [address] [place],  [place] [address]  Chief complaint: Follow up for pulmonary embolism, interstitial lung disease  HPI: 80 year old with history of Takotsubo cardiomyopathy, hyperlipidemia, hypertension, recent PE, DVT  Underwent left hip arthroplasty September 2019 and susequentlly developed left leg swelling which worsened.  A lower extremity Doppler on 1/7 showed DVT and placed on Xarelto.  Had a primary care visit with cough, chest congestion.  Noted to have bilateral crackles.  Was treated with Omnicef and Hycodan cough syrup but continues to have persistent symptoms. A CTA obtained showed a small nonocclusive thrombus in the right upper lobe and changes concerning for interstitial lung disease and she has been referred to pulmonary for further evaluation.  Chief complaint today is cough, nonproductive in nature.  Mild dyspnea on exertion.  No wheezing, fevers, chills.  She has occasional dysphagia, food getting stuck on swallowing.  Also complains of dry mouth, dry eyes and acid reflux.  Pets: Outside cat, no dogs, birds, farm animals Occupation: Lawyer for [employer], worked as a Insurance claims handler.  Currently retired. Exposures: No known exposures, no mold, hot tub, Jacuzzi, humidifier Smoking history: Never smoker Travel history: No significant travel history Relevant family history: Brothers and father have emphysema, COPD.  They are all smokers.  Her younger brother is currently hospitalized with spontaneous pneumothorax.  Father had lung issues.  Unclear if he had fibrosis.  Interim history: Breathing is doing well with no issues.  Outpatient Encounter Medications as of 07/05/2020  Medication Sig  . carboxymethylcellulose (REFRESH PLUS) 0.5 % SOLN Place 1 drop into both eyes daily.  . Cholecalciferol (VITAMIN D3) 250 MCG (10000  UT) capsule Take 10,000 Units by mouth every evening.  . ezetimibe (ZETIA) 10 MG tablet TAKE 1 TABLET(10 MG) BY MOUTH DAILY  . icosapent Ethyl (VASCEPA) 1 g capsule Take 2 capsules (2 g total) by mouth 2 (two) times daily.  Marland Kitchen lidocaine (LIDODERM) 5 % Place 1 patch onto the skin daily. Remove & Discard patch within 12 hours or as directed by MD  . Multiple Vitamins-Minerals (MULTIVITAMIN WITH MINERALS) tablet Take 1 tablet by mouth daily with lunch.   . Omega-3 Fatty Acids (FISH OIL) 1000 MG CAPS Take 1,000 mg by mouth 2 (two) times daily.   Marland Kitchen triamterene-hydrochlorothiazide (MAXZIDE) 75-50 MG tablet TAKE 1 TABLET BY MOUTH DAILY   No facility-administered encounter medications on file as of 07/05/2020.   Physical Exam: Blood pressure 112/78, pulse 76, temperature 98.3 F (36.8 C), temperature source Temporal, height 5\' 1"  (1.549 m), weight 147 lb (66.7 kg), SpO2 93 %. Gen:      No acute distress HEENT:  EOMI, sclera anicteric Neck:     No masses; no thyromegaly Lungs:    Bibasal crackles CV:         Regular rate and rhythm; no murmurs Abd:      + bowel sounds; soft, non-tender; no palpable masses, no distension Ext:    No edema; adequate peripheral perfusion Skin:      Warm and dry; no rash Neuro: alert and oriented x 3 Psych: normal mood and affect  Data Reviewed: Imaging: Lower extremity ultrasound 05/26/2018- acute DVT in the left leg  CTA 06/30/2018- small nonocclusive pulmonary embolism in the right upper lobe, aortic, coronary atherosclerosis, patchy areas of peripheral groundglass and septal  thickening bilaterally. I have reviewed the images personally.  High-resolution CT 07/19/2018- peripheral and basilar predominant subpleural groundglass, reticulation, traction bronchiectasis.  No honeycombing.   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  I have reviewed the images personally.  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  Clinically she is asymtomatic  Had a detailed discussion with patient today and discussed options for management We can try for lung biopsy or bronchoscopy with envisia classifier or treat with antifibrotics She has opted for conservative management since she feels well and does not want to attempt biopsy or treatment.  After informed decision making we have agreed for a follow-up PFTs in 6 months If there is progression then we will strongly consider antifibrotics.  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

## 2020-07-05 NOTE — Addendum Note (Signed)
Addended by: Gavin Potters R on: 07/05/2020 11:34 AM   Modules accepted: Orders

## 2020-07-05 NOTE — Patient Instructions (Addendum)
I am glad you are stable with regard to your breathing Your CT scan is stable.  There may be slight worsening on lung function test  We had discussed treatment options but per your preference we have held off on initiating treatment Schedule spirometry, diffusion capacity in 6 months and follow-up in clinic.

## 2020-07-05 NOTE — Progress Notes (Signed)
PFT done today. 

## 2020-07-12 ENCOUNTER — Encounter: Payer: Self-pay | Admitting: Cardiovascular Disease

## 2020-07-12 ENCOUNTER — Ambulatory Visit: Payer: Medicare Other | Admitting: Cardiovascular Disease

## 2020-07-12 ENCOUNTER — Other Ambulatory Visit: Payer: Self-pay

## 2020-07-12 DIAGNOSIS — M25471 Effusion, right ankle: Secondary | ICD-10-CM

## 2020-07-12 DIAGNOSIS — I700 Atherosclerosis of aorta: Secondary | ICD-10-CM

## 2020-07-12 DIAGNOSIS — M25472 Effusion, left ankle: Secondary | ICD-10-CM

## 2020-07-12 DIAGNOSIS — E785 Hyperlipidemia, unspecified: Secondary | ICD-10-CM | POA: Diagnosis not present

## 2020-07-12 DIAGNOSIS — I5181 Takotsubo syndrome: Secondary | ICD-10-CM | POA: Diagnosis not present

## 2020-07-12 DIAGNOSIS — Z79899 Other long term (current) drug therapy: Secondary | ICD-10-CM

## 2020-07-12 DIAGNOSIS — M25474 Effusion, right foot: Secondary | ICD-10-CM

## 2020-07-12 DIAGNOSIS — J849 Interstitial pulmonary disease, unspecified: Secondary | ICD-10-CM

## 2020-07-12 DIAGNOSIS — M25475 Effusion, left foot: Secondary | ICD-10-CM

## 2020-07-12 MED ORDER — LOSARTAN POTASSIUM 25 MG PO TABS: 25.0000 mg | ORAL_TABLET | Freq: Every day | ORAL | 3 refills | Status: DC

## 2020-07-12 NOTE — Patient Instructions (Signed)
Medication Instructions:  START- Losartan 25 mg by mouth daily  *If you need a refill on your cardiac medications before your next appointment, please call your pharmacy*   Lab Work: Fasting Lipid, CMP and TSH in 2 weeks  If you have labs (blood work) drawn today and your tests are completely normal, you will receive your results only by: Marland Kitchen MyChart Message (if you have MyChart) OR . A paper copy in the mail If you have any lab test that is abnormal or we need to change your treatment, we will call you to review the results.   Testing/Procedures: None Ordered   Follow-Up: At [facility], you and your health needs are our priority.  As part of our continuing mission to provide you with exceptional heart care, we have created designated Provider Care Teams.  These Care Teams include your primary Cardiologist (physician) and Advanced Practice Providers (APPs -  Physician Assistants and Nurse Practitioners) who all work together to provide you with the care you need, when you need it.  We recommend signing up for the patient portal called "MyChart".  Sign up information is provided on this After Visit Summary.  MyChart is used to connect with patients for Virtual Visits (Telemedicine).  Patients are able to view lab/test results, encounter notes, upcoming appointments, etc.  Non-urgent messages can be sent to your provider as well.   To learn more about what you can do with MyChart, go to [email or web address].    Your next appointment:   3 month(s)  The format for your next appointment:   In Person  Provider:   You may see Shelva Majestic, MD or one of the following Advanced Practice Providers on your designated Care Team:    [name], PA-C  Fabian Sharp, PA-C or   Roby Lofts, [place]
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
## 2020-07-14 ENCOUNTER — Encounter: Payer: Self-pay | Admitting: Cardiovascular Disease

## 2020-07-16 ENCOUNTER — Ambulatory Visit (INDEPENDENT_AMBULATORY_CARE_PROVIDER_SITE_OTHER): Payer: Medicare Other | Admitting: Ophthalmology

## 2020-07-16 ENCOUNTER — Other Ambulatory Visit: Payer: Self-pay

## 2020-07-16 ENCOUNTER — Encounter (INDEPENDENT_AMBULATORY_CARE_PROVIDER_SITE_OTHER): Payer: Self-pay | Admitting: Ophthalmology

## 2020-07-16 DIAGNOSIS — Z961 Presence of intraocular lens: Secondary | ICD-10-CM | POA: Diagnosis not present

## 2020-07-16 DIAGNOSIS — H35372 Puckering of macula, left eye: Secondary | ICD-10-CM | POA: Insufficient documentation

## 2020-07-16 DIAGNOSIS — H35371 Puckering of macula, right eye: Secondary | ICD-10-CM | POA: Insufficient documentation

## 2020-07-16 DIAGNOSIS — H04123 Dry eye syndrome of bilateral lacrimal glands: Secondary | ICD-10-CM | POA: Insufficient documentation

## 2020-07-16 NOTE — Assessment & Plan Note (Signed)
Dry eyes OU - recommend artificial tears and lubricating ointment as needed

## 2020-07-16 NOTE — Assessment & Plan Note (Signed)
Findings left eye similar to that in the right simply observe
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
## 2020-07-16 NOTE — Assessment & Plan Note (Signed)

## 2020-07-20 IMAGING — DX DG PORTABLE PELVIS
1 series · 1 of 1 positions shown · non-contrast
Comparison: CT abdomen pelvis 08/12/2013

CLINICAL DATA: Post left hip replacement

EXAM:
PORTABLE PELVIS 1-2 VIEWS

[pelvis ap]
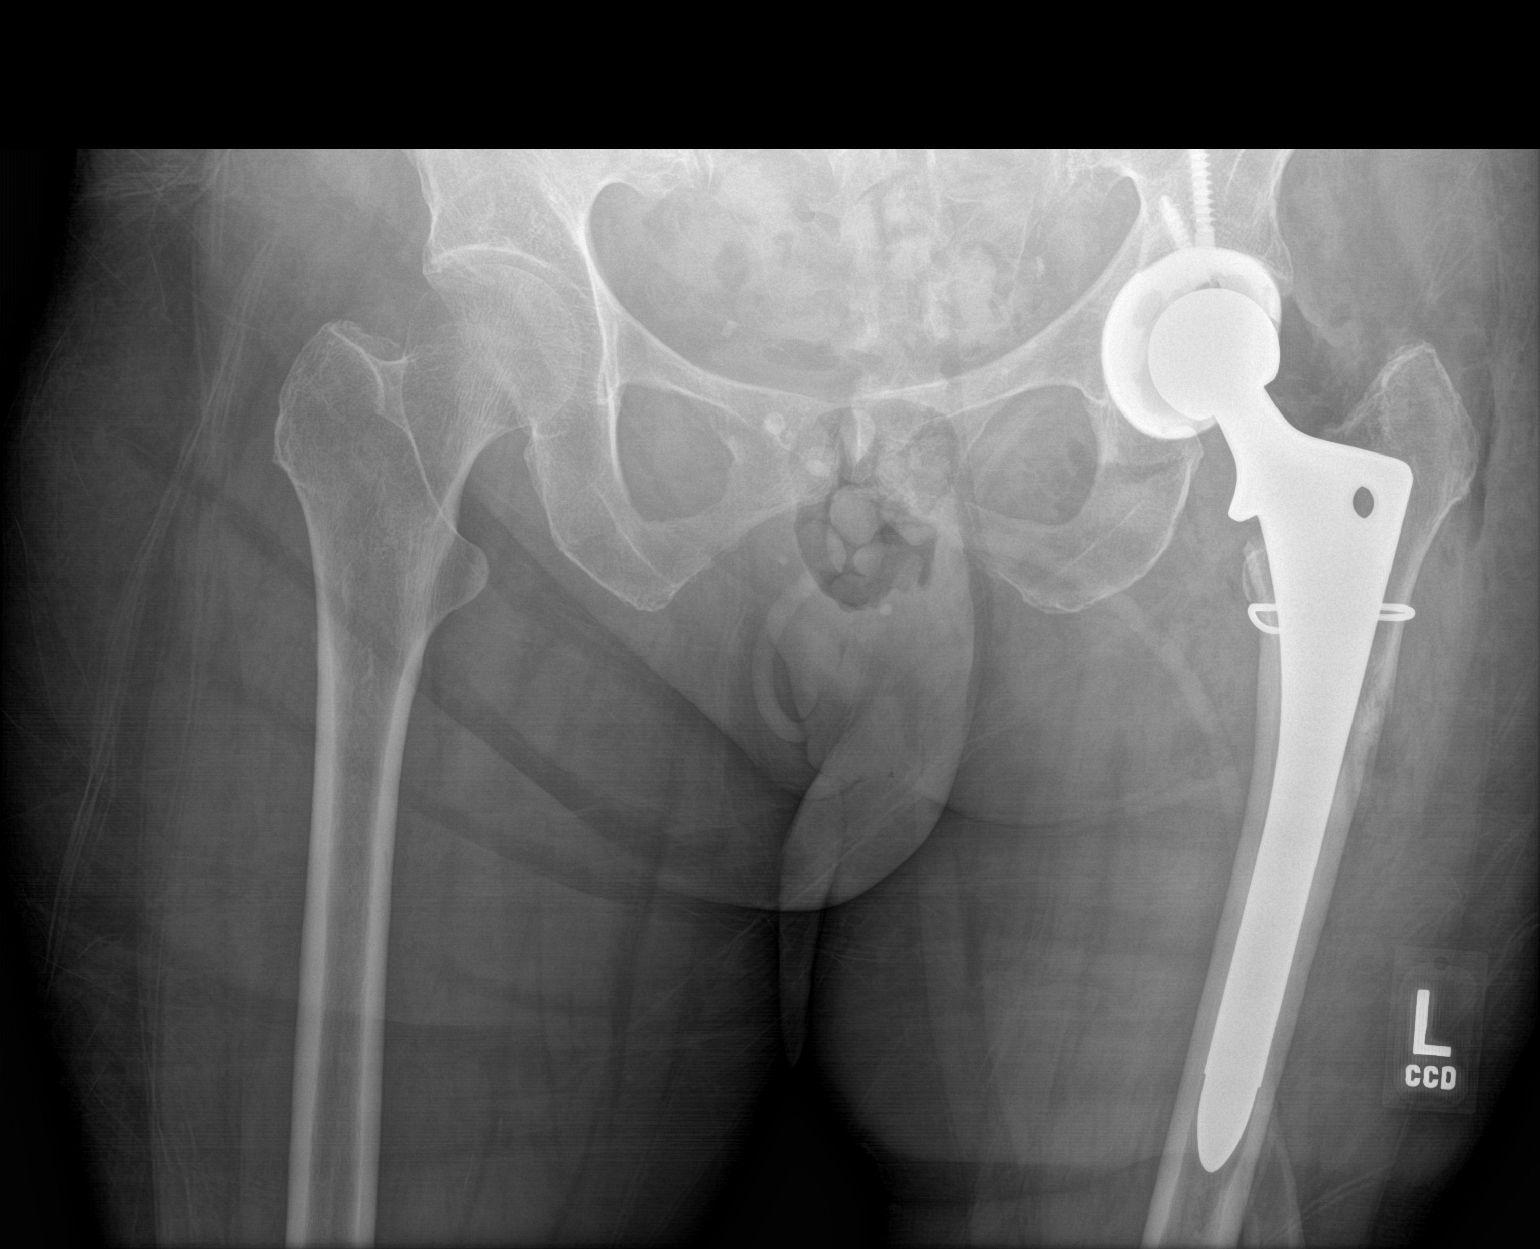

[1 of 1 positions shown; findings below may reference images not displayed]

FINDINGS: The acetabular and femoral components of the left total hip
replacement are in good position. The only questionable abnormality
is in the lateral aspect of the proximal left femoral shaft just
below the trochanters. The could be cortical disruption at that site
which may indicate fracture although there is difficult to assess
due to overlying air within the soft tissues postoperatively. The
pelvic rami appear intact. The SI joints are corticated.
IMPRESSION: 1. Left total hip replacement components in good position.
2. However, cannot exclude a subtle fracture involving the proximal
left femoral shaft as noted above.

## 2020-07-26 LAB — LIPID PANEL
Chol/HDL Ratio: 4.5 ratio — ABNORMAL HIGH (ref 0.0–4.4)
Cholesterol, Total: 221 mg/dL — ABNORMAL HIGH (ref 100–199)
HDL: 49 mg/dL (ref >39)
LDL Chol Calc (NIH): 128 mg/dL — ABNORMAL HIGH (ref 0–99)
Triglycerides: 249 mg/dL — ABNORMAL HIGH (ref 0–149)
VLDL Cholesterol Cal: 44 mg/dL — ABNORMAL HIGH (ref 5–40)

## 2020-07-26 LAB — COMPREHENSIVE METABOLIC PANEL
ALT: 14 IU/L (ref 0–32)
AST: 18 IU/L (ref 0–40)
Albumin/Globulin Ratio: 1.5 (ref 1.2–2.2)
Albumin: 4.4 g/dL (ref 3.6–4.6)
Alkaline Phosphatase: 84 IU/L (ref 44–121)
BUN/Creatinine Ratio: 21 (ref 12–28)
BUN: 21 mg/dL (ref 8–27)
Bilirubin Total: 0.4 mg/dL (ref 0.0–1.2)
CO2: 23 mmol/L (ref 20–29)
Calcium: 9.7 mg/dL (ref 8.7–10.3)
Chloride: 99 mmol/L (ref 96–106)
Creatinine, Ser: 0.99 mg/dL (ref 0.57–1.00)
Globulin, Total: 3 g/dL (ref 1.5–4.5)
Glucose: 100 mg/dL — ABNORMAL HIGH (ref 65–99)
Potassium: 4 mmol/L (ref 3.5–5.2)
Sodium: 138 mmol/L (ref 134–144)
Total Protein: 7.4 g/dL (ref 6.0–8.5)
eGFR: 57 mL/min/{1.73_m2} — ABNORMAL LOW (ref >59)

## 2020-07-26 LAB — TSH: TSH: 3.99 u[IU]/mL (ref 0.450–4.500)

## 2020-09-25 ENCOUNTER — Other Ambulatory Visit: Payer: Self-pay

## 2020-09-25 ENCOUNTER — Encounter: Payer: Self-pay | Admitting: Family Medicine

## 2020-09-25 ENCOUNTER — Ambulatory Visit: Payer: Medicare Other | Admitting: Family Medicine

## 2020-09-25 VITALS — BP 113/71 | HR 68 | Temp 98.1°F | Wt 147.0 lb

## 2020-09-25 DIAGNOSIS — J029 Acute pharyngitis, unspecified: Secondary | ICD-10-CM | POA: Diagnosis not present

## 2020-09-25 DIAGNOSIS — J40 Bronchitis, not specified as acute or chronic: Secondary | ICD-10-CM | POA: Diagnosis not present

## 2020-09-25 DIAGNOSIS — R059 Cough, unspecified: Secondary | ICD-10-CM | POA: Diagnosis not present

## 2020-09-25 LAB — POCT RAPID STREP A (OFFICE): Rapid Strep A Screen: NEGATIVE

## 2020-09-25 LAB — POCT INFLUENZA A/B
Influenza A, POC: NEGATIVE
Influenza B, POC: NEGATIVE

## 2020-09-25 MED ORDER — CEFDINIR 300 MG PO CAPS: 300.0000 mg | ORAL_CAPSULE | Freq: Two times a day (BID) | ORAL | 0 refills | Status: DC

## 2020-09-25 MED ORDER — HYDROCODONE BIT-HOMATROP MBR 5-1.5 MG/5ML PO SOLN: 5.0000 mL | Freq: Three times a day (TID) | ORAL | 0 refills | Status: DC | PRN

## 2020-09-25 MED ORDER — BENZONATATE 200 MG PO CAPS: 200.0000 mg | ORAL_CAPSULE | Freq: Two times a day (BID) | ORAL | 0 refills | Status: DC | PRN

## 2020-09-25 MED ORDER — PREDNISONE 20 MG PO TABS
40.0000 mg | ORAL_TABLET | Freq: Every day | ORAL | 0 refills | Status: DC
Start: 2020-09-25 — End: 2020-10-02

## 2020-09-25 NOTE — Patient Instructions (Signed)
Start prednsione 2 tabs daily for 5 days.  Buy Mucinex DM while at store- this helps cough and drainage Tessalon perles for cough Hycodan syrup nighttime cough. This will make you sleepy.    Printed omnicef antibiotic start only if worsening by Friday.
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
## 2020-10-02 ENCOUNTER — Other Ambulatory Visit: Payer: Self-pay

## 2020-10-02 ENCOUNTER — Encounter: Payer: Self-pay | Admitting: Cardiovascular Disease

## 2020-10-02 ENCOUNTER — Ambulatory Visit: Payer: Medicare Other | Admitting: Cardiovascular Disease

## 2020-10-02 DIAGNOSIS — M25471 Effusion, right ankle: Secondary | ICD-10-CM | POA: Diagnosis not present

## 2020-10-02 DIAGNOSIS — E782 Mixed hyperlipidemia: Secondary | ICD-10-CM | POA: Diagnosis not present

## 2020-10-02 DIAGNOSIS — M25475 Effusion, left foot: Secondary | ICD-10-CM

## 2020-10-02 DIAGNOSIS — E785 Hyperlipidemia, unspecified: Secondary | ICD-10-CM

## 2020-10-02 DIAGNOSIS — M25472 Effusion, left ankle: Secondary | ICD-10-CM

## 2020-10-02 DIAGNOSIS — M25474 Effusion, right foot: Secondary | ICD-10-CM

## 2020-10-02 DIAGNOSIS — I5181 Takotsubo syndrome: Secondary | ICD-10-CM

## 2020-10-02 DIAGNOSIS — J84112 Idiopathic pulmonary fibrosis: Secondary | ICD-10-CM | POA: Diagnosis not present

## 2020-10-02 DIAGNOSIS — I10 Essential (primary) hypertension: Secondary | ICD-10-CM

## 2020-10-02 MED ORDER — PITAVASTATIN CALCIUM 2 MG PO TABS: 2.0000 mg | ORAL_TABLET | Freq: Every day | ORAL | 3 refills | Status: DC

## 2020-10-02 MED ORDER — FUROSEMIDE 20 MG PO TABS: 20.0000 mg | ORAL_TABLET | Freq: Every day | ORAL | 3 refills | Status: DC

## 2020-10-02 NOTE — Patient Instructions (Addendum)
Medication Instructions:  STOP taking Maxzide (triamterene-hydrochlorothiazide).   BEGIN furosemide (lasix) 20mg  daily.  BEGIN pitavastatin (Livalo) 2mg  daily.  *If you need a refill on your cardiac medications before your next appointment, please call your pharmacy*   Lab Work: CMET and lipid in 3 months  Testing/Procedures: None ordered.    Follow-Up: At [facility], you and your health needs are our priority.  As part of our continuing mission to provide you with exceptional heart care, we have created designated Provider Care Teams.  These Care Teams include your primary Cardiologist (physician) and Advanced Practice Providers (APPs -  Physician Assistants and Nurse Practitioners) who all work together to provide you with the care you need, when you need it.  We recommend signing up for the patient portal called "MyChart".  Sign up information is provided on this After Visit Summary.  MyChart is used to connect with patients for Virtual Visits (Telemedicine).  Patients are able to view lab/test results, encounter notes, upcoming appointments, etc.  Non-urgent messages can be sent to your provider as well.   To learn more about what you can do with MyChart, go to [email or web address].    Your next appointment:   6 month(s)  The format for your next appointment:   In Person  Provider:   Shelva Majestic, MD
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
## 2020-10-03 ENCOUNTER — Telehealth: Payer: Self-pay

## 2020-10-03 NOTE — Telephone Encounter (Signed)
**Note De-Identified Stassi Fadely Obfuscation** Livalo PA started through covermymeds. Key: [id number]

## 2020-10-03 NOTE — Telephone Encounter (Signed)
Prior authorization for Livalo 2 mg received from patient's insurance company via [email or web address].  CMM Key: [id number] Message sent to PA department for processing.

## 2020-10-04 ENCOUNTER — Encounter: Payer: Self-pay | Admitting: Cardiovascular Disease

## 2020-10-04 NOTE — Telephone Encounter (Signed)
Received a fax from [facility] stating PA for Livalo was denied due to not being covered by patient's insurance. Will forward to MD to make aware.

## 2020-10-04 NOTE — Telephone Encounter (Signed)
**Note De-Identified Divinity Kyler Obfuscation** Message received from covermymeds: Lenise Arena Key: [id number] - PA Case ID: [id number] - Rx #: [id number] Outcome: Denied on May 18 LIVALO TAB 2MG  is denied for not meeting the prior authorization requirement(s). Details of this decision are in the notice attached below or have been faxed to you.  Drug: Livalo 2MG  tablets Form: [facility] Electronic Prior Authorization Form (2017 NCPDP)  Awaiting denial letter with details of this Livalo denial that will be faxed to Dr Ermalinda Memos office at [facility].

## 2020-10-05 NOTE — Telephone Encounter (Signed)
The patient was not able to tolerate other statins and had tolerated Livalo in the past.

## 2020-10-08 ENCOUNTER — Telehealth: Payer: Self-pay | Admitting: Cardiovascular Disease

## 2020-10-08 ENCOUNTER — Other Ambulatory Visit (HOSPITAL_BASED_OUTPATIENT_CLINIC_OR_DEPARTMENT_OTHER): Payer: Self-pay | Admitting: Family Medicine

## 2020-10-08 DIAGNOSIS — Z1231 Encounter for screening mammogram for malignant neoplasm of breast: Secondary | ICD-10-CM

## 2020-10-08 NOTE — Telephone Encounter (Signed)
Pt c/o medication issue:  1. Name of Medication: furosemide furosemide (LASIX) 20 MG tablet   2. How are you currently taking this medication (dosage and times per day)? Take 1 tablet (20 mg total) by mouth daily.  3. Are you having a reaction (difficulty breathing--STAT)? SWELLING, NOT GOING TO THE RESTROOM MUCH   4. What is your medication issue?  PT STATES THAT SHE HAS HAD ALOT OF SWELLING SINCE STARTING LASIX (FEET & FINGERS), PT WAS TOLD THAT SHE SHOULD STOP TAKING FLUID PILLS AND IS NOT GOING TO THE RESTROOM, SHE WANTS TO KNOW SHUOLD SHE GET A HIGHER DOSE, PT ALSO SAYS THAT INS IS STATING THAT THEY ARE UNABLE TO COVER NEW RX FOR THE CHOLESTEROL REACHED OUT TO DR.KELLY ABOUT THIS AND HASNST HEARD BACK.   Pt c/o swelling: STAT is pt has developed SOB within 24 hours  1) How much weight have you gained and in what time span? Possibly 1-2 pounds in a week   2) If swelling, where is the swelling located? Feet and both hands   3) Are you currently taking a fluid pill? Yes but states that she stopped taking another fluid pill   4) Are you currently SOB? no  5) Do you have a log of your daily weights (if so, list)? No   6) Have you gained 3 pounds in a day or 5 pounds in a week? Possibly 1-2 pounds   7) Have you traveled recently? no

## 2020-10-08 NOTE — Telephone Encounter (Signed)
Patient of Dr. Claiborne Billings called in about retaining fluid. She states her shoes and rings hardly fit. She has felt miserable. She was recently changed to lasix after 5/17 due to unilateral leg edema. She reports swelling is worse than last week. She reports weight gain of about 5lbs over the course of a week - varies from 1-1.5lbs - does not check daily though. She reports minimal added salt intake - educated on foods high in sodium that can cause fluid retention.   Advise that she take extra lasix 20mg  for 2 days and keep daily log of weights. Will forward to MD/RN to review.  ______________________  She also inquired about her livalo prescription - explained that insurance has not approved but MD and prior auth nurse is aware. Patient states she has tried/failed crestor, lipitor, pravastatin & zetia (per chart review) and had myalgias

## 2020-10-10 NOTE — Telephone Encounter (Signed)
If patient continues to have swelling she can take Lasix 20 mg on an as-needed basis.  I believe the patient in the past had tolerated Livalo.  If she cannot tolerate any other statins and cannot tolerate Zetia try to work with insurance to see why they cannot approve this medication

## 2020-10-11 NOTE — Telephone Encounter (Signed)
**Note De-Identified Delise Simenson Obfuscation** I called [facility] and attempted to appeal this Livalo PA. I s/w Shanon Brow who advised me that the pt has a different ID # with them than what we have. The pts correct ID per Shanon Brow is ID: [id number]                                                    BIN: [id number]                                                    PCN: [id number]                                                    GRP: COS Shanon Brow attempted for more than 20 mins to connect me with a PA specialist but the call was dropped. I called back and was on hold for 15 mins.  I then attempted a new Livalo PA through covermymeds using the new ID number. Key: [id number]

## 2020-10-11 NOTE — Telephone Encounter (Signed)
See updated encounter

## 2020-10-12 ENCOUNTER — Other Ambulatory Visit: Payer: Self-pay

## 2020-10-12 MED ORDER — PITAVASTATIN CALCIUM 2 MG PO TABS: 2.0000 mg | ORAL_TABLET | Freq: Every day | ORAL | 3 refills | Status: DC

## 2020-10-12 NOTE — Telephone Encounter (Signed)
**Note De-Identified Aristea Posada Obfuscation** We received another denial on the pts Livalo PA.  I called [employer] and sw Lovena Le who advised me that Chanda Busing is a plan exclusion and is not covered at all.  Lovena Le also states that this is a new ins plan for the pt that started on 05/19/2020 so if Livalo was approved for coverage in the past it was not by them.  I will forward this phone note to Dr Claiborne Billings and his nurse for advisement to the pt.

## 2020-10-16 ENCOUNTER — Inpatient Hospital Stay (HOSPITAL_BASED_OUTPATIENT_CLINIC_OR_DEPARTMENT_OTHER): Admission: RE | Admit: 2020-10-16 | Payer: Medicare Other | Source: Ambulatory Visit

## 2020-10-18 ENCOUNTER — Telehealth: Payer: Self-pay | Admitting: Cardiovascular Disease

## 2020-10-18 NOTE — Telephone Encounter (Signed)
Returned call to patient who states that she was calling to see what the status was on her Livalo Prescription. Patient states she has still not been able to obtain this and would like to know what to do.  Advised patient that I would forward message to Dr. Claiborne Billings and his nurse to review and advise.

## 2020-10-18 NOTE — Telephone Encounter (Signed)
    Pt is calling back in regards to obtaining Pharmacy Assistance for the following medication Livalo, she stated that she called last week and have not yet heard back from our office.

## 2020-10-18 NOTE — Telephone Encounter (Signed)
Pt c/o swelling: STAT is pt has developed SOB within 24 hours  1) How much weight have you gained and in what time span?  1lb  2) If swelling, where is the swelling located? Bilateral feet/ankle swelling  3) Are you currently taking a fluid pill? Yes Lasix, Pt was advised to start taking 2 fluid pill and it hasnt helped  4) Are you currently SOB? No pt has lung problems and she doesn't see a difference in how she normally breaths  5) Do you have a log of your daily weights (if so, list)? No  6) Have you gained 3 pounds in a day or 5 pounds in a week? 1lb  7) Have you traveled recently? No

## 2020-10-18 NOTE — Telephone Encounter (Signed)
Returned call to patient who states that she has been experiencing swelling in her ankles and legs despite taking increased dose of lasix. Patient states that she has been taking 40mg  daily for the last few days to help with swelling. Patient reports that she has not noticed any difference. Patient states that she has also gained 1 pound over the last week. Patient states that she has been drinking increased amounts of water to try and help with the lasix. Advised patient to monitor fluid intake and not to over hydrate as this would not help with swelling. Patient denies any shortness of breath or chest pain/pressure but states that she has been intermittently dizzy. Patient reports her blood pressure over the last few days has been 155/83, 133/76, and today 127/55. Patient states that her dizziness has improved today as she is not as dizzy as she was on Sunday. Patient states that Sunday her blood pressure was 272 systolic and she contributes the dizziness to that. Advised patient that I would forward message to Dr. Claiborne Billings. Advised patient to monitor blood pressure and heart rate daily and write these numbers down, advised her to monitor her weight daily and write these numbers down as well. Advised patient to watch sodium intake, and maintain fluid intake and to elevate her legs when able. Made patient an appointment with [name] PA-C for 6/20 to discuss weight log and BP log with symptoms.   Made patient aware of ED precautions should new or worsening symptoms develop. Patient verbalized understanding.   Will forward to Dr. Claiborne Billings for review and any recommendations. Patient aware of all instructions and verbalized understanding.

## 2020-10-22 NOTE — Telephone Encounter (Signed)
Agree with current recommendations as outlined and follow-up evaluation with [name], PA  in 2 weeks.

## 2020-10-23 ENCOUNTER — Other Ambulatory Visit: Payer: Self-pay

## 2020-10-23 ENCOUNTER — Encounter (HOSPITAL_BASED_OUTPATIENT_CLINIC_OR_DEPARTMENT_OTHER): Payer: Self-pay

## 2020-10-23 ENCOUNTER — Ambulatory Visit (HOSPITAL_BASED_OUTPATIENT_CLINIC_OR_DEPARTMENT_OTHER)
Admission: RE | Admit: 2020-10-23 | Discharge: 2020-10-23 | Disposition: A | Discharge status: Home / Self Care | Payer: Medicare Other | Source: Ambulatory Visit | Attending: Family Medicine | Admitting: Family Medicine

## 2020-10-23 DIAGNOSIS — Z1231 Encounter for screening mammogram for malignant neoplasm of breast: Secondary | ICD-10-CM | POA: Diagnosis not present

## 2020-10-23 NOTE — Telephone Encounter (Signed)
Patient aware of Dr. Evette Georges recommendations and verbalized understanding.

## 2020-10-24 ENCOUNTER — Telehealth: Payer: Self-pay | Admitting: Cardiovascular Disease

## 2020-10-24 NOTE — Telephone Encounter (Signed)
    Pt said she received a missed call from [place], she said it's probably Dr. Evette Georges nurse. She requested for a call back

## 2020-10-24 NOTE — Telephone Encounter (Signed)
Left message to call back  

## 2020-10-24 NOTE — Telephone Encounter (Signed)
Spoke to patient she stated she received a message on voice mail from Dr.Kelly's RN yesterday.Stated she will keep appointment with [name] 6/20 at 3:15 pm.Stated she wanted her to call her back.Advised she is not in office today.I will send message to her.

## 2020-10-30 ENCOUNTER — Telehealth: Payer: Self-pay

## 2020-10-30 NOTE — Telephone Encounter (Signed)
Appeal letter for  [place] faxed to [employer] for processing. Will await decision.

## 2020-10-30 NOTE — Telephone Encounter (Signed)
Pt made aware that an appeal letter has been faxed on her behalf to insurance company. Currently awaiting decision.   Pt verbalized understanding.

## 2020-10-30 NOTE — Telephone Encounter (Signed)
Prior authorization request received from [facility] for [place].  Will forward to prior auth department for processing.

## 2020-10-30 NOTE — Telephone Encounter (Signed)
**Note De-Identified Adonay Scheier Obfuscation** I started a Vascepa PA through covermymeds. MCN:[id number]

## 2020-11-02 NOTE — Telephone Encounter (Signed)
**Note De-Identified Coleman Kalas Obfuscation** Guerry Minors Fiorentino (Key: [id number]) Vascepa 1GM capsules   Form: [employer] Electronic Prior Authorization Form (2017 NCPDP) Determination: Unfavorable Message from Plan Request Reference Number: [id number]. VASCEPA CAP 1GM is denied for not meeting the prior authorization requirement(s). Details of this decision are in the notice attached below or have been faxed to you.   Will forward to Dr Evette Georges nurse so she is aware that the denial letter with reason for denial will be faxed to our [place] office from [employer].

## 2020-11-05 ENCOUNTER — Encounter: Payer: Self-pay | Admitting: Physician Assistant

## 2020-11-05 ENCOUNTER — Other Ambulatory Visit: Payer: Self-pay

## 2020-11-05 ENCOUNTER — Ambulatory Visit: Payer: Medicare Other | Admitting: Physician Assistant

## 2020-11-05 VITALS — BP 122/74 | HR 60 | Ht 62.0 in | Wt 143.0 lb

## 2020-11-05 DIAGNOSIS — I10 Essential (primary) hypertension: Secondary | ICD-10-CM | POA: Diagnosis not present

## 2020-11-05 DIAGNOSIS — R600 Localized edema: Secondary | ICD-10-CM | POA: Diagnosis not present

## 2020-11-05 DIAGNOSIS — I5181 Takotsubo syndrome: Secondary | ICD-10-CM

## 2020-11-05 DIAGNOSIS — Z79899 Other long term (current) drug therapy: Secondary | ICD-10-CM

## 2020-11-05 MED ORDER — LOSARTAN POTASSIUM 50 MG PO TABS: 50.0000 mg | ORAL_TABLET | Freq: Every day | ORAL | 3 refills | Status: DC
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
## 2020-11-05 NOTE — Patient Instructions (Addendum)
Medication Instructions:  INCREASE Losartan 50 mg daily  *If you need a refill on your cardiac medications before your next appointment, please call your pharmacy*  Lab Work: Your physician recommends that you return for lab work TODAY:  BMET  If you have labs (blood work) drawn today and your tests are completely normal, you will receive your results only by: [place] (if you have MyChart) OR A paper copy in the mail If you have any lab test that is abnormal or we need to change your treatment, we will call you to review the results.  Testing/Procedures: NONE ordered at this time of appointment   Follow-Up: At [facility], you and your health needs are our priority.  As part of our continuing mission to provide you with exceptional heart care, we have created designated Provider Care Teams.  These Care Teams include your primary Cardiologist (physician) and Advanced Practice Providers (APPs -  Physician Assistants and Nurse Practitioners) who all work together to provide you with the care you need, when you need it.  We recommend signing up for the patient portal called "MyChart".  Sign up information is provided on this After Visit Summary.  MyChart is used to connect with patients for Virtual Visits (Telemedicine).  Patients are able to view lab/test results, encounter notes, upcoming appointments, etc.  Non-urgent messages can be sent to your provider as well.   To learn more about what you can do with MyChart, go to [email or web address].    Your next appointment:   1 month(s) 5 month(s)  The format for your next appointment:   In Person In Person   Provider:   [facility] Pharm D Troy Sine, MD  Other Instructions

## 2020-11-06 LAB — BASIC METABOLIC PANEL
BUN/Creatinine Ratio: 21 (ref 12–28)
BUN: 15 mg/dL (ref 8–27)
CO2: 25 mmol/L (ref 20–29)
Calcium: 9.6 mg/dL (ref 8.7–10.3)
Chloride: 104 mmol/L (ref 96–106)
Creatinine, Ser: 0.7 mg/dL (ref 0.57–1.00)
Glucose: 93 mg/dL (ref 65–99)
Potassium: 4.2 mmol/L (ref 3.5–5.2)
Sodium: 144 mmol/L (ref 134–144)
eGFR: 87 mL/min/{1.73_m2} (ref >59)

## 2020-11-06 NOTE — Telephone Encounter (Signed)
**Note De-Identified Kada Friesen Obfuscation** Per covermymeds I did the pts Vascepa PA under [employer] and should have done it under [facility] (?). I called [employer] and was advised by Webb Silversmith that they do not handle medication prior authorizations and that I will need to contact [employer]. I called [employer] and was advised by Ubaldo Glassing that the pts plan does not cover Vascepa as it is an exclusion from the pts part B plan and that the pt has no Part D ins through them.  I called [facility] and was advised that they just ran the pts RX as generic Icosapent and it is covered without a PA required and the pts cost is $45/30 day supply of Vascepa.

## 2020-11-09 NOTE — Progress Notes (Signed)
Stable renal function and electrolyte

## 2020-11-14 IMAGING — DX DG CHEST 2V
2 series · 2 of 2 positions shown · non-contrast
Comparison: 04/25/2018

CLINICAL DATA: Cough, RIGHT lower lobe rales, history hypertension,
GERD, STEMI

EXAM:
CHEST - 2 VIEW

[chest pa]
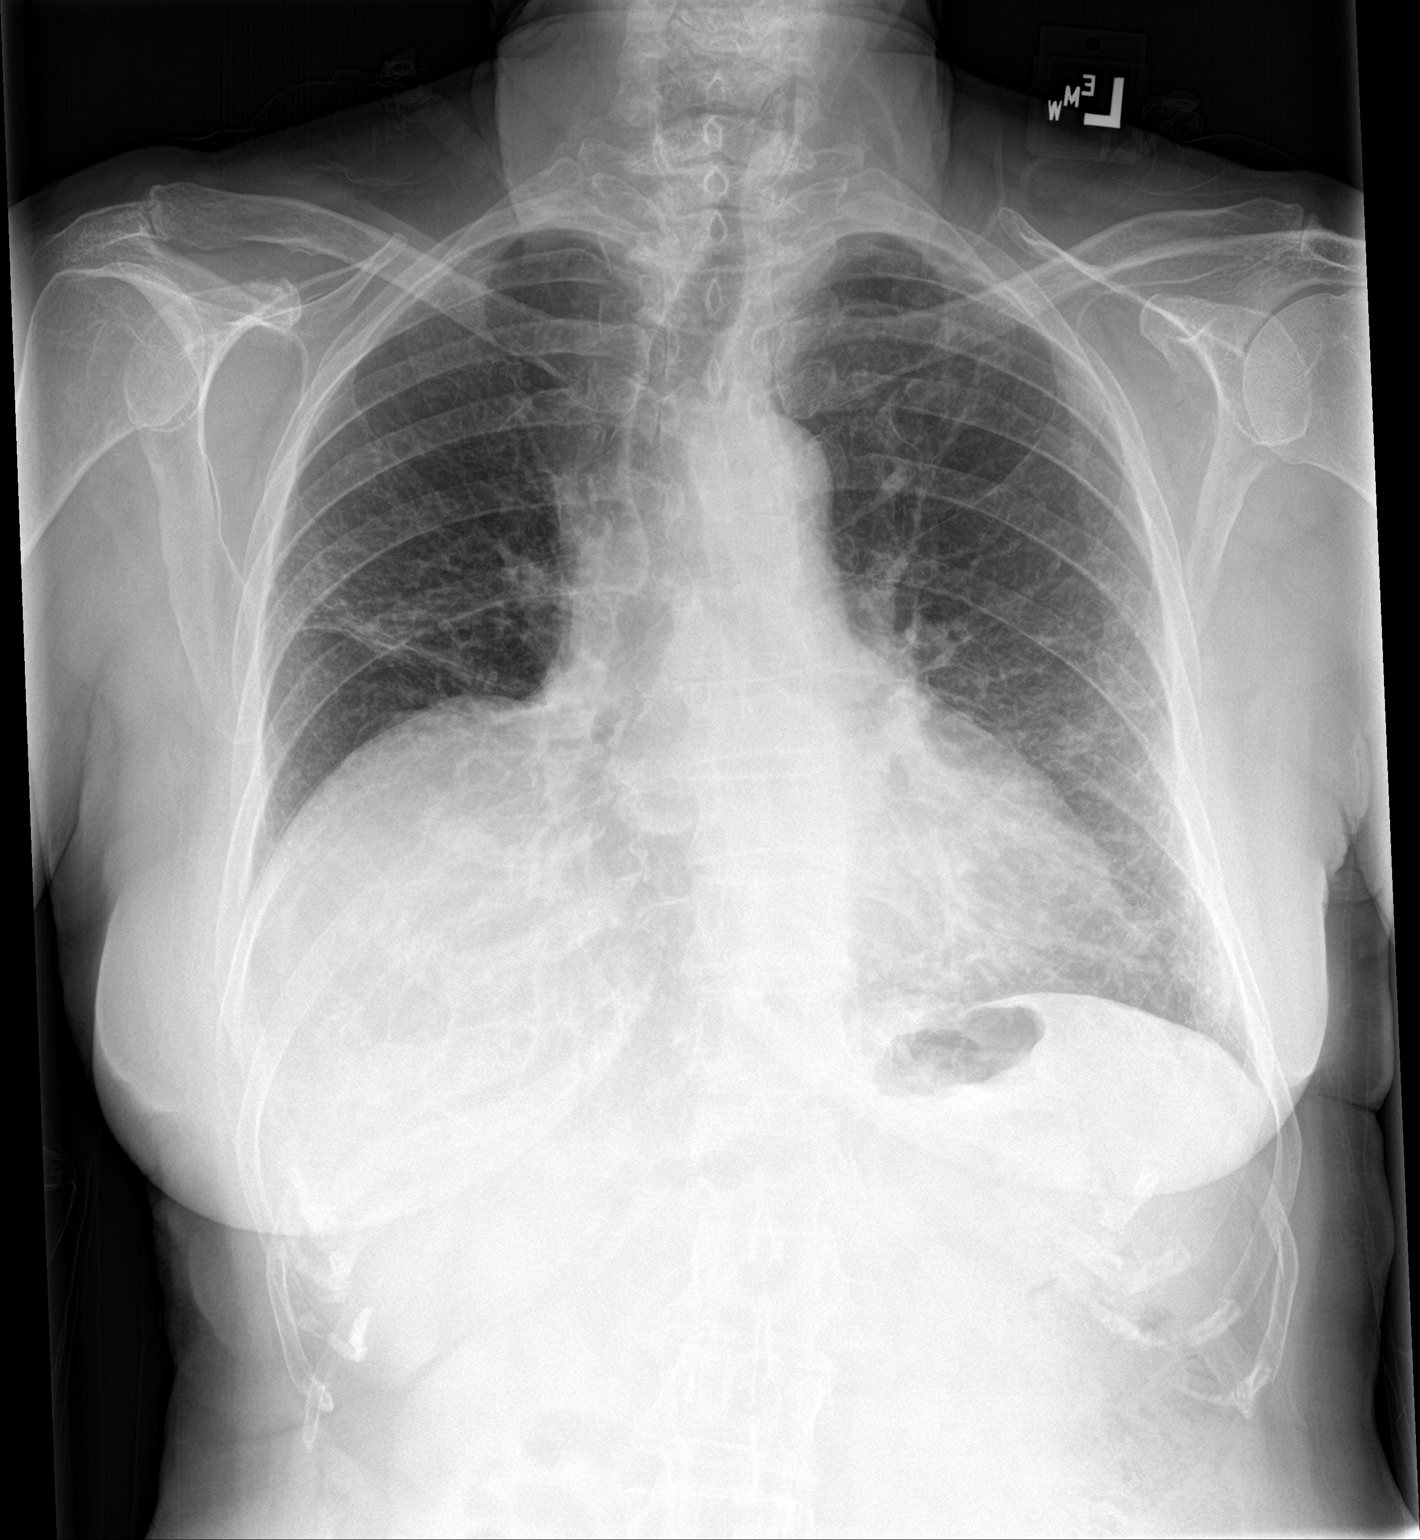

[chest lat]
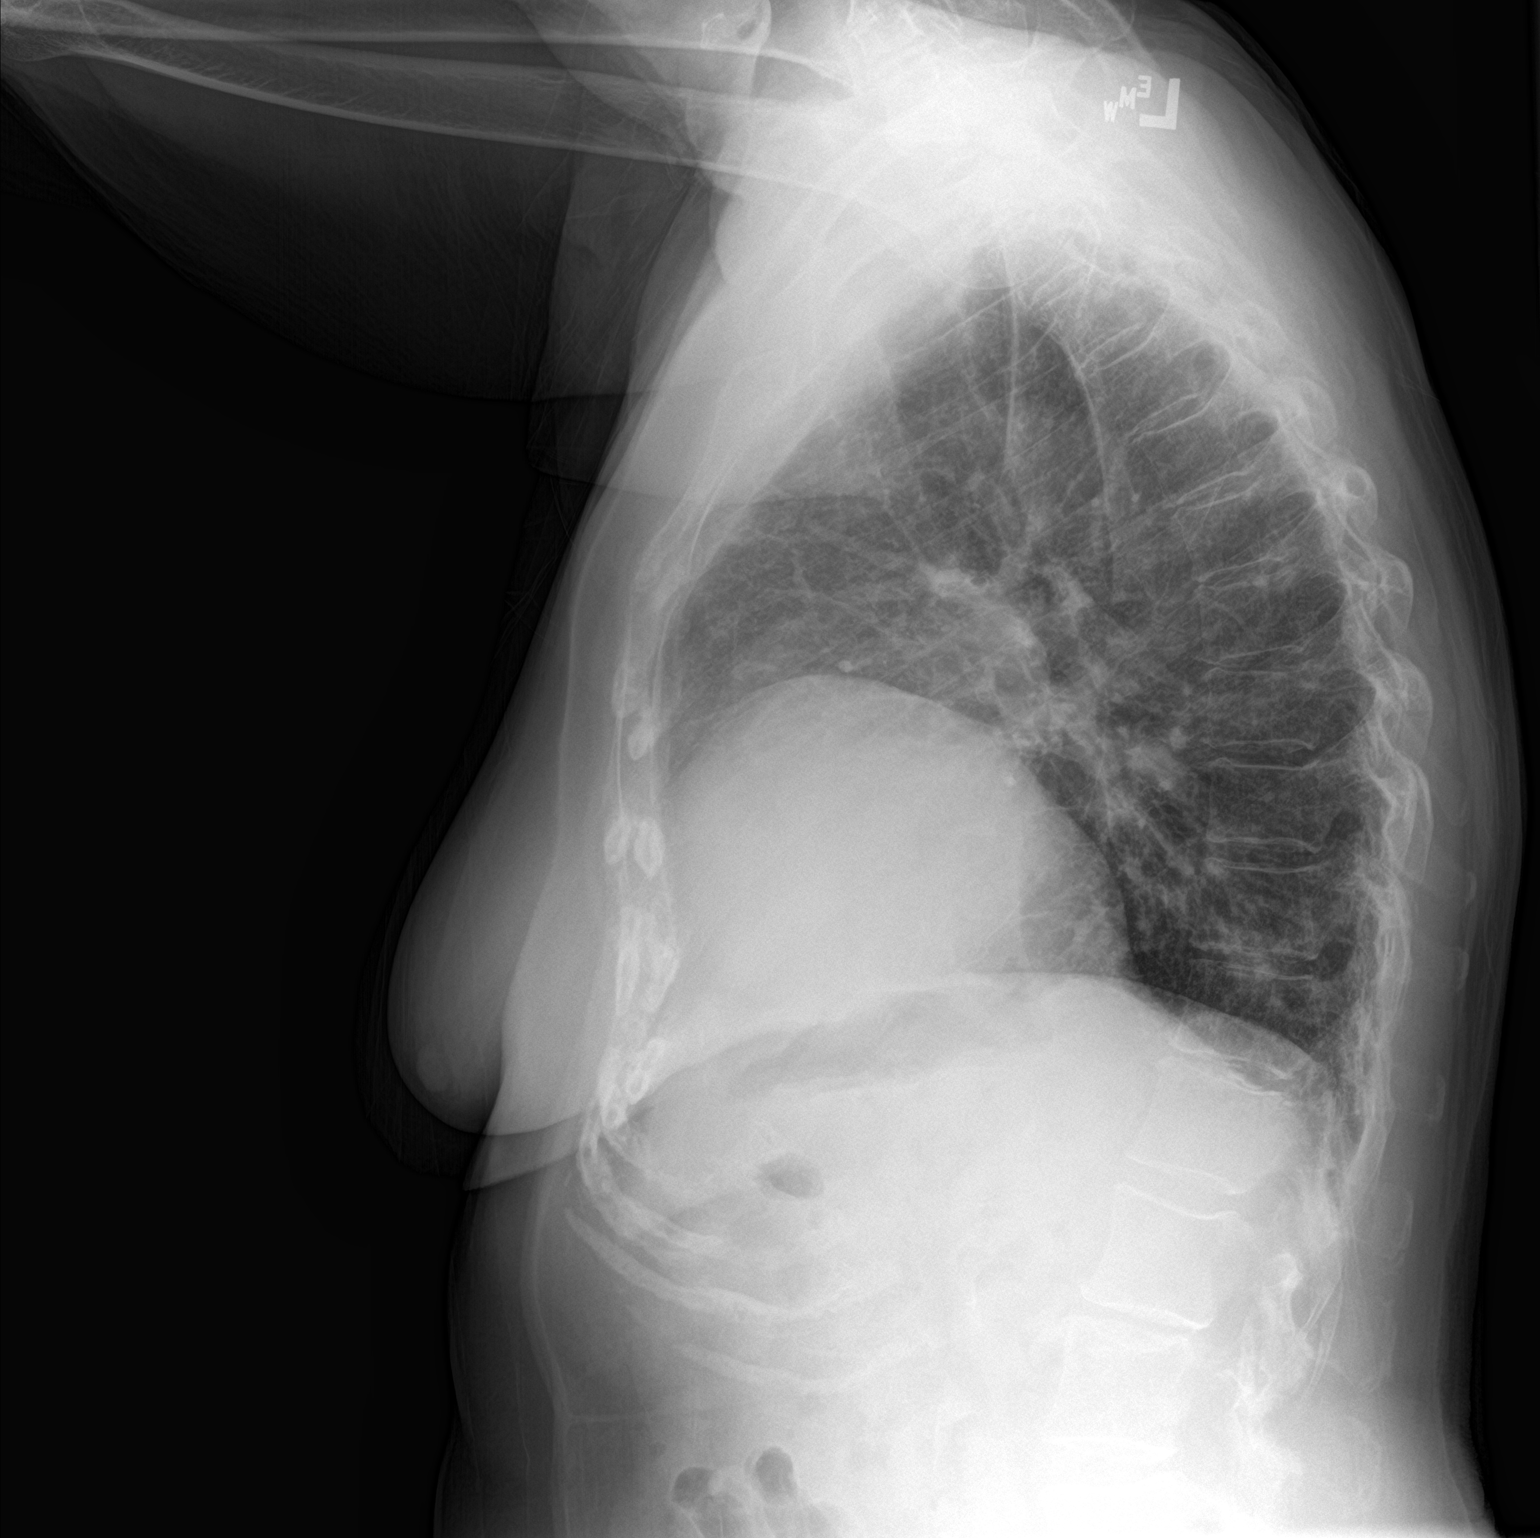

[2 of 2 positions shown; findings below may reference images not displayed]

FINDINGS: Enlargement of cardiac silhouette.

Chronic elevation versus eventration of the RIGHT diaphragm
unchanged.

Mediastinal contours and pulmonary vascularity otherwise normal.

Subsegmental atelectasis at RIGHT base.

Increased interstitial prominence at the lung bases versus the [DATE] represent bibasilar acute interstitial infiltrates though
interval development of chronic interstitial lung disease could have
a similar appearance.

Upper lungs clear.

No pleural effusion or pneumothorax.

Bones demineralized.
IMPRESSION: Enlargement of cardiac silhouette.

RIGHT basilar subsegmental atelectasis with interstitial infiltrates
at the lower lungs, could represent acute interstitial infiltrate
such as from atypical infection or pulmonary edema, or interval
development of chronic interstitial lung disease.

## 2020-12-04 ENCOUNTER — Encounter (INDEPENDENT_AMBULATORY_CARE_PROVIDER_SITE_OTHER): Payer: Medicare Other | Admitting: Ophthalmology

## 2020-12-05 ENCOUNTER — Telehealth: Payer: Self-pay | Admitting: Cardiovascular Disease

## 2020-12-05 DIAGNOSIS — Z79899 Other long term (current) drug therapy: Secondary | ICD-10-CM

## 2020-12-05 NOTE — Telephone Encounter (Signed)
Spoke with pt who report her current lasix dose is wrote for 20 mg daily. However, she was still experiencing increased swelling and report she was instructed by a nurse to alternate 20 mg with 40 mg every other day. Pt report them seems to work well.   Pt state she went to the pharmacy yesterday and was told they can not fill current medication because its too early and current prescription on file is wrote for 20 mg daily. Pt is requesting a new prescription for alternating dose.   Will forward to PA for recommendations

## 2020-12-05 NOTE — Telephone Encounter (Signed)
New Message:   Please call, question about her Lasix.

## 2020-12-06 ENCOUNTER — Ambulatory Visit (INDEPENDENT_AMBULATORY_CARE_PROVIDER_SITE_OTHER): Payer: Medicare Other | Admitting: Pharmacist

## 2020-12-06 ENCOUNTER — Other Ambulatory Visit: Payer: Self-pay

## 2020-12-06 VITALS — BP 144/88 | HR 66 | Resp 15 | Ht 61.5 in | Wt 150.0 lb

## 2020-12-06 DIAGNOSIS — I10 Essential (primary) hypertension: Secondary | ICD-10-CM | POA: Diagnosis not present

## 2020-12-06 DIAGNOSIS — I5181 Takotsubo syndrome: Secondary | ICD-10-CM | POA: Diagnosis not present

## 2020-12-06 DIAGNOSIS — I2510 Atherosclerotic heart disease of native coronary artery without angina pectoris: Secondary | ICD-10-CM

## 2020-12-06 DIAGNOSIS — I700 Atherosclerosis of aorta: Secondary | ICD-10-CM

## 2020-12-06 MED ORDER — PITAVASTATIN CALCIUM 2 MG PO TABS: 2.0000 mg | ORAL_TABLET | Freq: Every day | ORAL | 3 refills | Status: DC

## 2020-12-06 MED ORDER — AMLODIPINE BESYLATE 2.5 MG PO TABS: 2.5000 mg | ORAL_TABLET | Freq: Every day | ORAL | 0 refills | Status: DC

## 2020-12-06 MED ORDER — FUROSEMIDE 40 MG PO TABS: ORAL_TABLET | ORAL | 1 refills | Status: DC

## 2020-12-06 NOTE — Patient Instructions (Addendum)
It was nice meeting you today!  We would like your blood pressure to be less than 130/80  Please continue your losartan 50mg  once a day  I refilled your furosemide for you at 40mg   We will start a new medication called amlodipine 2.5mg .  Continue to monitor your blood pressure at home and let us know if there are any changes or if you feel any side effects  Please call with any questions!  Karren Cobble, PharmD, BCACP, [place], [place] [address] [place], [place] [address] Phone: [phone]; Fax: [phone] 12/06/2020 11:48 AM
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
## 2020-12-10 NOTE — Telephone Encounter (Signed)
I am fine with alternating '20mg'$  and '40mg'$  every other day of lasix. If she is taking higher dose, I would recommend a BMET in 1-2 weeks to make sure lasix is not drying her out.

## 2020-12-10 NOTE — Telephone Encounter (Signed)
acknowleded

## 2020-12-10 NOTE — Telephone Encounter (Signed)
Returned call to Angela Preston to inform her that I spoke with Angela Preston and is is okay with her taking the Lasix alternating 20 mg and 40 mg daily. She informed me that she saw the HTN clinic nurse last week and he started her on Amlodipine 2.5 mg daily and Lasix 40 mg to take half tablet to 1 tablet as needed for swelling. Upon review of her chart [name] saw Angela Preston, [facility] on 12/06/20 who did start her on the Amlodipine 2.5 mg daily and the 40 mg Lasix to take half tablet to 1 tablet daily as needed for swelling. I also informed her that Angela Preston would like for her to return in 1-2 weeks to have BMET labs checked to make sure the lasix is not drying her out. Patient verbalized understanding and all (if any) questions were answered. Placed order for BMET.

## 2020-12-14 ENCOUNTER — Telehealth: Payer: Self-pay | Admitting: Pulmonary Disease

## 2020-12-14 NOTE — Telephone Encounter (Signed)
Pt is requesting to be scheduled for her annual CT scan. Pls regard; [phone]

## 2020-12-14 NOTE — Telephone Encounter (Signed)
Call made to patient confirmed DOB. She is wanting to know if she is supposed to have another CT before her appt in September. Per the notes her CT was done in February and no repeat scan was mentioned. Voiced understanding.   Nothing further needed at this time.

## 2020-12-18 ENCOUNTER — Other Ambulatory Visit: Payer: Self-pay

## 2020-12-18 DIAGNOSIS — Z79899 Other long term (current) drug therapy: Secondary | ICD-10-CM

## 2020-12-18 LAB — BASIC METABOLIC PANEL
BUN/Creatinine Ratio: 16 (ref 12–28)
BUN: 13 mg/dL (ref 8–27)
CO2: 27 mmol/L (ref 20–29)
Calcium: 9.9 mg/dL (ref 8.7–10.3)
Chloride: 102 mmol/L (ref 96–106)
Creatinine, Ser: 0.8 mg/dL (ref 0.57–1.00)
Glucose: 93 mg/dL (ref 65–99)
Potassium: 5 mmol/L (ref 3.5–5.2)
Sodium: 143 mmol/L (ref 134–144)
eGFR: 74 mL/min/{1.73_m2} (ref >59)

## 2021-01-03 ENCOUNTER — Encounter: Payer: Self-pay | Admitting: Pharmacist

## 2021-01-03 ENCOUNTER — Other Ambulatory Visit: Payer: Self-pay

## 2021-01-03 ENCOUNTER — Ambulatory Visit (INDEPENDENT_AMBULATORY_CARE_PROVIDER_SITE_OTHER): Payer: Medicare Other | Admitting: Pharmacist

## 2021-01-03 VITALS — BP 138/76 | HR 69 | Resp 18 | Ht 61.5 in | Wt 151.6 lb

## 2021-01-03 DIAGNOSIS — I10 Essential (primary) hypertension: Secondary | ICD-10-CM

## 2021-01-03 MED ORDER — AMLODIPINE BESYLATE 2.5 MG PO TABS: 2.5000 mg | ORAL_TABLET | Freq: Every day | ORAL | 1 refills | Status: DC

## 2021-01-03 NOTE — Patient Instructions (Signed)
It was good seeing you again!  We would like your blood pressure to be less than 130/80  Continue your amlodipine 2.'5mg'$  daily Continue your losartan '50mg'$  daily Continue your furosemide '40mg'$  daily  Continue to check your blood pressure at home.  If it is consistently greater than 130/80 please give [place] a call and we can discuss increasing one of the medications.  Please call with any questions!  Karren Cobble, PharmD, BCACP, [place], [place] [id number] [place], [place] [address] Phone: [phone]; Fax: [phone] 01/03/2021 11:09 AM
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
## 2021-01-14 ENCOUNTER — Encounter (INDEPENDENT_AMBULATORY_CARE_PROVIDER_SITE_OTHER): Payer: Self-pay | Admitting: Ophthalmology

## 2021-01-14 ENCOUNTER — Other Ambulatory Visit: Payer: Self-pay

## 2021-01-14 ENCOUNTER — Ambulatory Visit (INDEPENDENT_AMBULATORY_CARE_PROVIDER_SITE_OTHER): Payer: Medicare Other | Admitting: Ophthalmology

## 2021-01-14 DIAGNOSIS — H35372 Puckering of macula, left eye: Secondary | ICD-10-CM

## 2021-01-14 DIAGNOSIS — H35371 Puckering of macula, right eye: Secondary | ICD-10-CM | POA: Diagnosis not present

## 2021-01-14 NOTE — Assessment & Plan Note (Signed)
None distorting epiretinal membrane inferior nasal to the fovea will observe
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
## 2021-01-14 NOTE — Assessment & Plan Note (Signed)
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  We will continue to observe, no change over the last 6 months

## 2021-01-23 ENCOUNTER — Other Ambulatory Visit: Payer: Self-pay | Admitting: Cardiovascular Disease

## 2021-01-25 ENCOUNTER — Ambulatory Visit: Payer: Medicare Other | Admitting: Pulmonary Disease

## 2021-01-25 ENCOUNTER — Encounter: Payer: Self-pay | Admitting: Pulmonary Disease

## 2021-01-25 ENCOUNTER — Other Ambulatory Visit: Payer: Self-pay

## 2021-01-25 ENCOUNTER — Ambulatory Visit (INDEPENDENT_AMBULATORY_CARE_PROVIDER_SITE_OTHER): Payer: Medicare Other | Admitting: Pulmonary Disease

## 2021-01-25 VITALS — BP 122/78 | HR 86 | Temp 97.0°F | Ht 61.5 in | Wt 146.0 lb

## 2021-01-25 DIAGNOSIS — J849 Interstitial pulmonary disease, unspecified: Secondary | ICD-10-CM | POA: Diagnosis not present

## 2021-01-25 LAB — PULMONARY FUNCTION TEST
DL/VA % pred: 113 %
DL/VA: 4.68 ml/min/mmHg/L
DLCO cor % pred: 74 %
DLCO cor: 12.81 ml/min/mmHg
DLCO unc % pred: 74 %
DLCO unc: 12.81 ml/min/mmHg
FEF 25-75 Pre: 4.04 L/sec
FEF2575-%Pred-Pre: 332 %
FEV1-%Pred-Pre: 104 %
FEV1-Pre: 1.75 L
FEV1FVC-%Pred-Pre: 127 %
FEV6-%Pred-Pre: 86 %
FEV6-Pre: 1.84 L
FEV6FVC-%Pred-Pre: 106 %
FVC-%Pred-Pre: 82 %
FVC-Pre: 1.87 L
Pre FEV1/FVC ratio: 94 %
Pre FEV6/FVC Ratio: 100 %

## 2021-01-25 NOTE — Addendum Note (Signed)
Addended by: Elton Sin on: 01/25/2021 04:18 PM   Modules accepted: Orders

## 2021-01-25 NOTE — Progress Notes (Signed)
Angela Preston    [id number]    [birth year]  [facility], Reinaldo Raddle, DO  Referring Physician: Ma Hillock, DO [address] [place],  [place] [address]  Chief complaint: Follow up for pulmonary embolism, interstitial lung disease  HPI: 80 year old with history of Takotsubo cardiomyopathy, hyperlipidemia, hypertension, recent PE, DVT  Underwent left hip arthroplasty September 2019 and susequentlly developed left leg swelling which worsened.  A lower extremity Doppler on 1/7 showed DVT and placed on Xarelto.  Had a primary care visit with cough, chest congestion.  Noted to have bilateral crackles.  Was treated with Omnicef and Hycodan cough syrup but continues to have persistent symptoms. A CTA obtained showed a small nonocclusive thrombus in the right upper lobe and changes concerning for interstitial lung disease and she has been referred to pulmonary for further evaluation.  Chief complaint today is cough, nonproductive in nature.  Mild dyspnea on exertion.  No wheezing, fevers, chills.  She has occasional dysphagia, food getting stuck on swallowing.  Also complains of dry mouth, dry eyes and acid reflux.  Pets: Outside cat, no dogs, birds, farm animals Occupation: Lawyer for [employer], worked as a Insurance claims handler.  Currently retired. Exposures: No known exposures, no mold, hot tub, Jacuzzi, humidifier Smoking history: Never smoker Travel history: No significant travel history Relevant family history: Brothers and father have emphysema, COPD.  They are all smokers.  Her younger brother is currently hospitalized with spontaneous pneumothorax.  Father had lung issues.  Unclear if he had fibrosis.  Interim history: States that breathing is doing well with no issues.  She is here for review of PFTs.  Outpatient Encounter Medications as of 01/25/2021  Medication Sig   amLODipine (NORVASC) 2.5 MG tablet Take 1 tablet (2.5 mg total) by mouth daily.    carboxymethylcellulose (REFRESH PLUS) 0.5 % SOLN Place 1 drop into both eyes daily.   Cholecalciferol (VITAMIN D3) 250 MCG (10000 UT) capsule Take 10,000 Units by mouth every evening.   ezetimibe (ZETIA) 10 MG tablet TAKE 1 TABLET(10 MG) BY MOUTH DAILY   furosemide (LASIX) 40 MG tablet Take 1/2 to 1 tablet by mouth every day as needed for swelling   icosapent Ethyl (VASCEPA) 1 g capsule Take 2 capsules (2 g total) by mouth 2 (two) times daily.   losartan (COZAAR) 50 MG tablet Take 1 tablet (50 mg total) by mouth daily.   Multiple Vitamins-Minerals (MULTIVITAMIN WITH MINERALS) tablet Take 1 tablet by mouth daily with lunch.    Omega-3 Fatty Acids (FISH OIL) 1000 MG CAPS Take 1,000 mg by mouth 2 (two) times daily.    Pitavastatin Calcium 2 MG TABS Take 1 tablet (2 mg total) by mouth daily.   No facility-administered encounter medications on file as of 01/25/2021.   Physical Exam: Blood pressure 112/78, pulse 76, temperature 98.3 F (36.8 C), temperature source Temporal, height '5\' 1"'$  (1.549 m), weight 147 lb (66.7 kg), SpO2 93 %. Gen:      No acute distress HEENT:  EOMI, sclera anicteric Neck:     No masses; no thyromegaly Lungs:    Bibasal crackles CV:         Regular rate and rhythm; no murmurs Abd:      + bowel sounds; soft, non-tender; no palpable masses, no distension Ext:    No edema; adequate peripheral perfusion Skin:      Warm and dry; no rash Neuro: alert and oriented x 3 Psych:  normal mood and affect  Data Reviewed: Imaging: Lower extremity ultrasound 05/26/2018- acute DVT in the left leg  CTA 06/30/2018- small nonocclusive pulmonary embolism in the right upper lobe, aortic, coronary atherosclerosis, patchy areas of peripheral groundglass and septal thickening bilaterally. I have reviewed the images personally.  High-resolution CT 07/19/2018- peripheral and basilar predominant subpleural groundglass, reticulation, traction bronchiectasis.  No honeycombing.   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  I have reviewed the images personally.  PFTs 07/20/2018 FVC 2.12 [83%], FEV1 1.95 [103%), F/F 92, TLC 73%, DLCO corrected 134%  07/05/2020 FVC 2.70 [81%], FEV1 1.82 [100%], F/F 91, TLC 3.23 [65%], DLCO 11.95 [60%] Moderate restriction, diffusion defect  01/25/2021 FVC 1.87 [82%], FEV1 1.75 [104%], F/F 94, diffusion capacity 12.81 [74%] Improvement in diffusion capacity.  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  Clinically she is asymtomatic  Had a detailed discussion with patient today and discussed options for management We can try for lung biopsy or bronchoscopy with envisia classifier or treat with antifibrotics After informed decision making she has opted for conservative management since she feels well and does not want to attempt biopsy or treatment.  PFTs reviewed today.  Although FVC is lower diffusion capacity has improved.  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

## 2021-01-25 NOTE — Progress Notes (Signed)
Full PFT performed today. °

## 2021-01-25 NOTE — Patient Instructions (Signed)
I have reviewed your lung function test which look stable which is good news I am glad that your breathing is doing well  I will order follow-up high-resolution CT in 6 months for reevaluation of your lungs Return to clinic in 6 months

## 2021-01-25 NOTE — Patient Instructions (Signed)
Full PFT performed today. °

## 2021-02-18 ENCOUNTER — Telehealth: Payer: Self-pay

## 2021-02-18 DIAGNOSIS — I10 Essential (primary) hypertension: Secondary | ICD-10-CM

## 2021-02-18 NOTE — Telephone Encounter (Signed)
Pt calling to speak with dr. Evelene Croon regarding bp readings is as follows. I will route to him to follow up 9/28 142/85 9/29 144 70 9/30 138/78  9/31 148/76 10/1 149/76am pm 154/84 10/2 129/79 am, noon 139/70

## 2021-02-19 MED ORDER — AMLODIPINE BESYLATE 5 MG PO TABS: 5.0000 mg | ORAL_TABLET | Freq: Every day | ORAL | 1 refills | Status: DC

## 2021-02-19 NOTE — Telephone Encounter (Signed)
Spoke with patient, BP has increased in last week.  Will increase amlodipine to 5mg  daily and recheck in with patient next week

## 2021-03-16 IMAGING — MG DIGITAL SCREENING BILATERAL MAMMOGRAM WITH TOMO AND CAD
8 series · 9 of 24 positions shown · non-contrast
Comparison: Previous exam(s).

CLINICAL DATA: Screening.

EXAM:
DIGITAL SCREENING BILATERAL MAMMOGRAM WITH TOMO AND CAD

[R CC synth-2D]
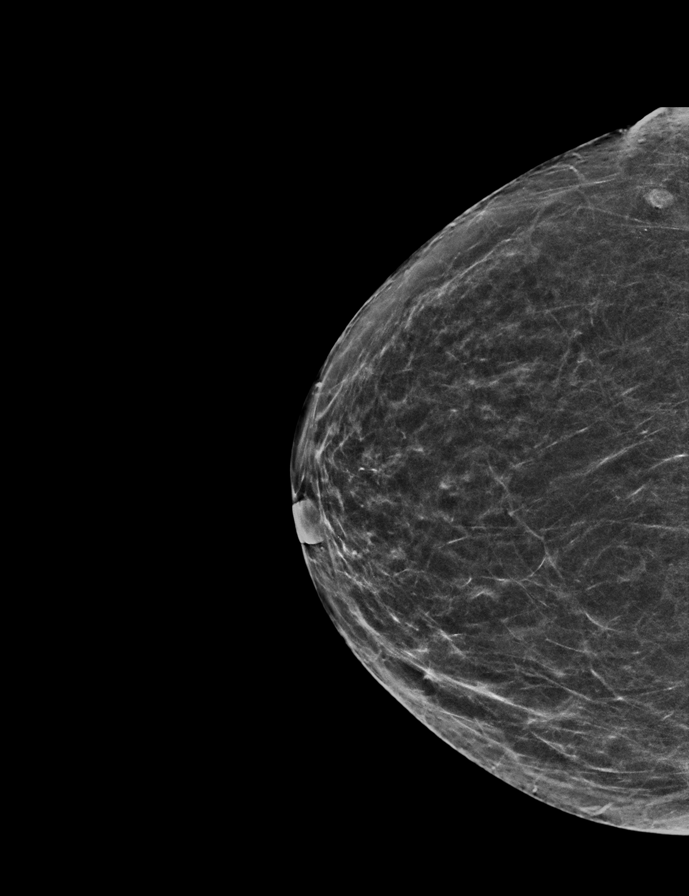

[L CC synth-2D]
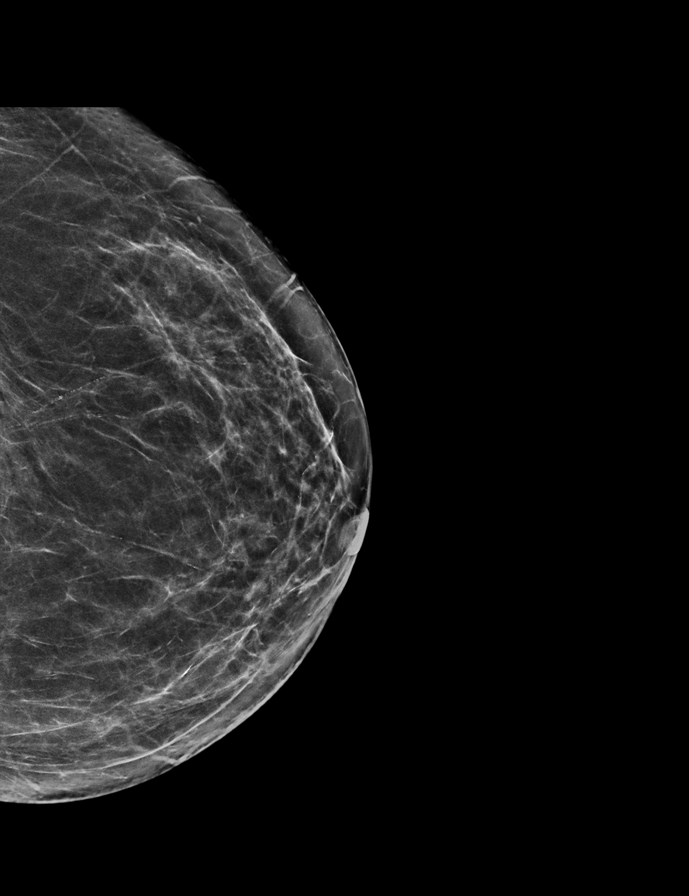

[R MLO synth-2D]
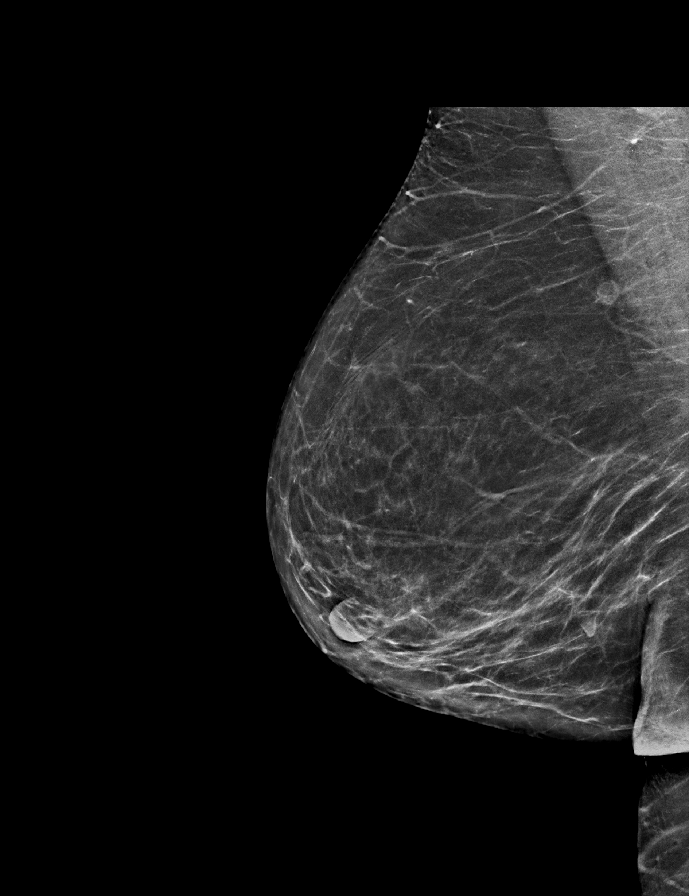

[L MLO synth-2D]
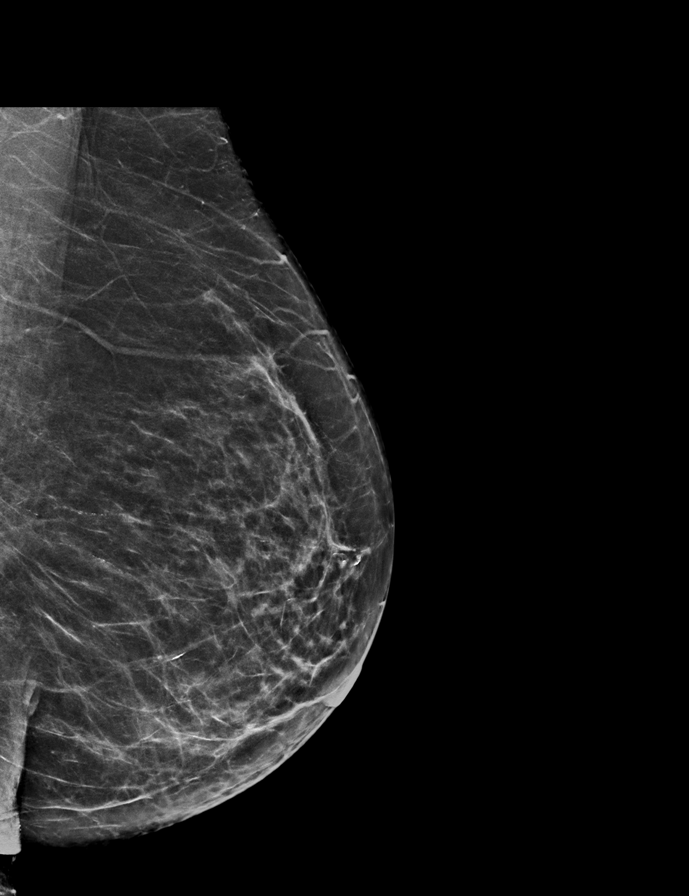

[L MLO tomo · 2 of 62 frames shown]
[frame 21/62]
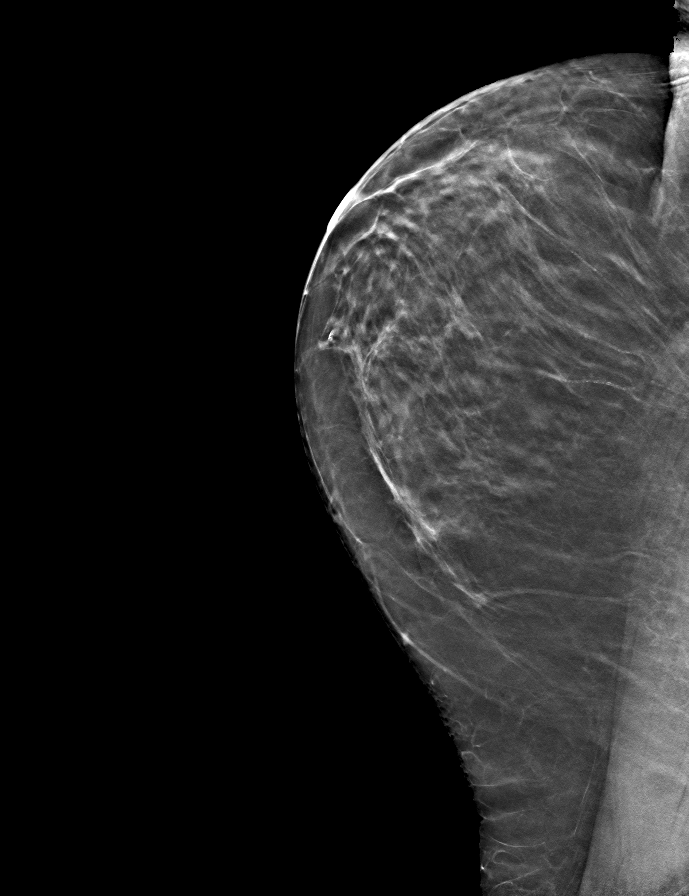
[frame 31/62]
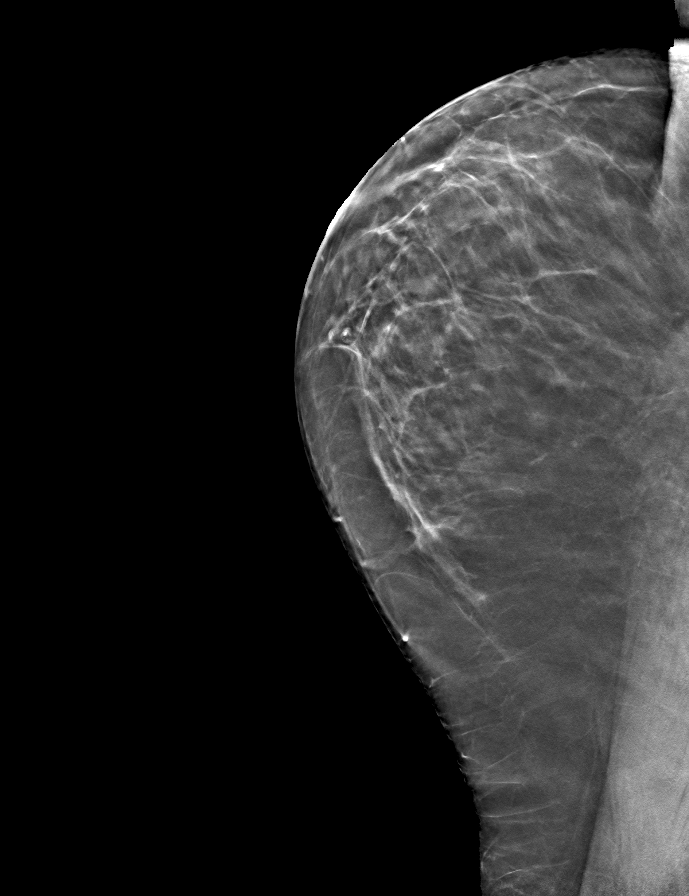

[L CC tomo · tomo slice 31/60.0]
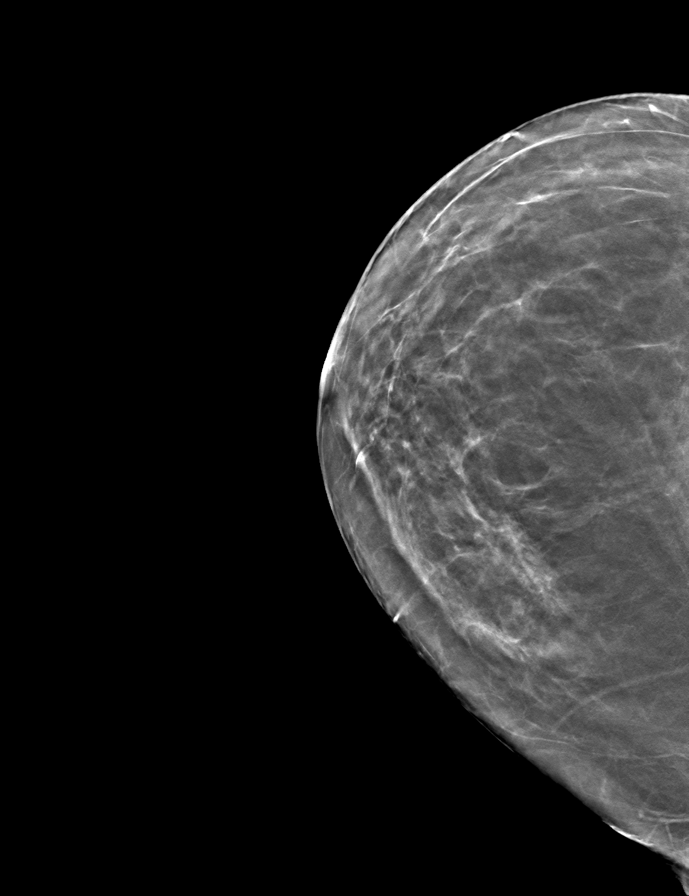

[R MLO tomo · tomo slice 31/61.0]
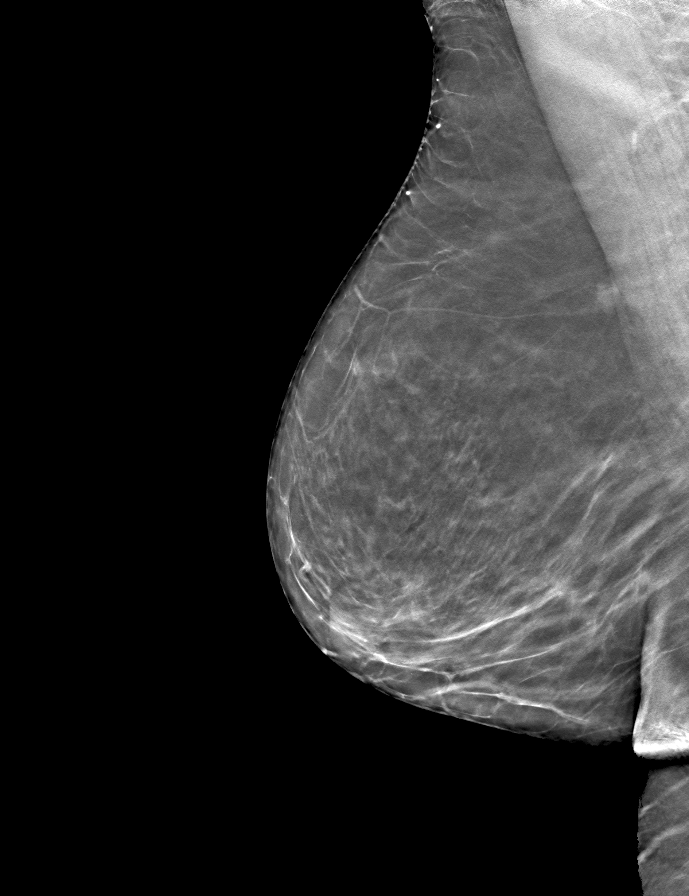

[R CC tomo · tomo slice 27/52.0]
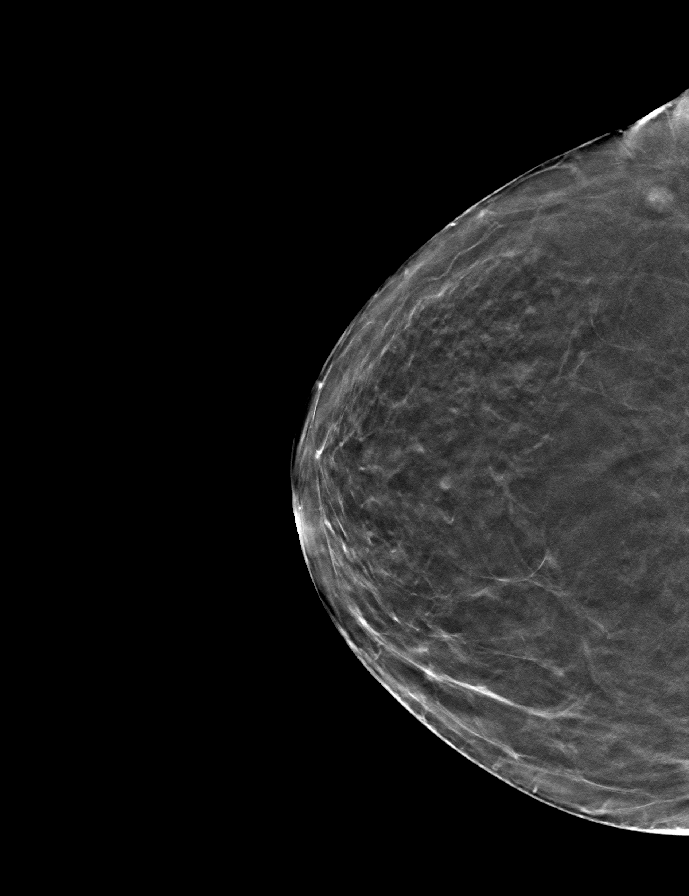

[9 of 24 positions shown; findings below may reference images not displayed]

ACR Breast Density Category b: There are scattered areas of
fibroglandular density.
FINDINGS: There are no findings suspicious for malignancy. Images were
processed with CAD.
IMPRESSION: No mammographic evidence of malignancy. A result letter of this
screening mammogram will be mailed directly to the patient.

RECOMMENDATION:
Screening mammogram in one year. (Code:[id number])

BI-RADS CATEGORY  1: Negative.

## 2021-04-10 ENCOUNTER — Encounter: Payer: Self-pay | Admitting: Cardiovascular Disease

## 2021-04-10 ENCOUNTER — Ambulatory Visit: Payer: Medicare Other | Admitting: Cardiovascular Disease

## 2021-04-10 ENCOUNTER — Other Ambulatory Visit: Payer: Self-pay

## 2021-04-10 DIAGNOSIS — I5181 Takotsubo syndrome: Secondary | ICD-10-CM

## 2021-04-10 DIAGNOSIS — R600 Localized edema: Secondary | ICD-10-CM

## 2021-04-10 DIAGNOSIS — F410 Panic disorder [episodic paroxysmal anxiety] without agoraphobia: Secondary | ICD-10-CM | POA: Diagnosis not present

## 2021-04-10 DIAGNOSIS — I10 Essential (primary) hypertension: Secondary | ICD-10-CM

## 2021-04-10 DIAGNOSIS — J84112 Idiopathic pulmonary fibrosis: Secondary | ICD-10-CM

## 2021-04-10 DIAGNOSIS — E785 Hyperlipidemia, unspecified: Secondary | ICD-10-CM

## 2021-04-10 MED ORDER — METOPROLOL SUCCINATE ER 25 MG PO TB24: 12.5000 mg | ORAL_TABLET | Freq: Every day | ORAL | 3 refills | Status: DC

## 2021-04-10 MED ORDER — TRIAMTERENE-HCTZ 75-50 MG PO TABS: 1.0000 | ORAL_TABLET | Freq: Every day | ORAL | 3 refills | Status: DC
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
## 2021-04-10 NOTE — Patient Instructions (Signed)
Medication Instructions:  Start taking metoprolol succinate 12.5 mg (one-half tablet) each day.  Take maxzide 75/50 each day.  *If you need a refill on your cardiac medications before your next appointment, please call your pharmacy*   Lab Work: None    Testing/Procedures: None    Follow-Up: At [facility], you and your health needs are our priority.  As part of our continuing mission to provide you with exceptional heart care, we have created designated Provider Care Teams.  These Care Teams include your primary Cardiologist (physician) and Advanced Practice Providers (APPs -  Physician Assistants and Nurse Practitioners) who all work together to provide you with the care you need, when you need it.  We recommend signing up for the patient portal called "MyChart".  Sign up information is provided on this After Visit Summary.  MyChart is used to connect with patients for Virtual Visits (Telemedicine).  Patients are able to view lab/test results, encounter notes, upcoming appointments, etc.  Non-urgent messages can be sent to your provider as well.   To learn more about what you can do with MyChart, go to [email or web address].    Your next appointment:   4 month(s)  The format for your next appointment:   In Person  Provider:   [name], PA   Then, in 8 months, follow up with Dr. Corky Downs, MD

## 2021-04-17 ENCOUNTER — Encounter: Payer: Self-pay | Admitting: Cardiovascular Disease

## 2021-04-25 ENCOUNTER — Ambulatory Visit: Payer: Medicare Other | Admitting: Gastroenterology

## 2021-05-16 ENCOUNTER — Other Ambulatory Visit: Payer: Self-pay

## 2021-05-16 ENCOUNTER — Encounter: Payer: Self-pay | Admitting: Family Medicine

## 2021-05-16 ENCOUNTER — Ambulatory Visit: Payer: Medicare Other | Admitting: Family Medicine

## 2021-05-16 VITALS — BP 97/60 | HR 65 | Temp 99.1°F

## 2021-05-16 DIAGNOSIS — J40 Bronchitis, not specified as acute or chronic: Secondary | ICD-10-CM

## 2021-05-16 DIAGNOSIS — J029 Acute pharyngitis, unspecified: Secondary | ICD-10-CM | POA: Diagnosis not present

## 2021-05-16 DIAGNOSIS — R051 Acute cough: Secondary | ICD-10-CM | POA: Diagnosis not present

## 2021-05-16 DIAGNOSIS — J849 Interstitial pulmonary disease, unspecified: Secondary | ICD-10-CM | POA: Diagnosis not present

## 2021-05-16 LAB — POCT INFLUENZA A/B
Influenza A, POC: NEGATIVE
Influenza B, POC: NEGATIVE

## 2021-05-16 LAB — POC COVID19 BINAXNOW: SARS Coronavirus 2 Ag: NEGATIVE

## 2021-05-16 LAB — POCT RAPID STREP A (OFFICE): Rapid Strep A Screen: NEGATIVE

## 2021-05-16 MED ORDER — CEFDINIR 300 MG PO CAPS: 300.0000 mg | ORAL_CAPSULE | Freq: Two times a day (BID) | ORAL | 0 refills | Status: DC

## 2021-05-16 MED ORDER — HYDROCODONE BIT-HOMATROP MBR 5-1.5 MG/5ML PO SOLN: 5.0000 mL | Freq: Three times a day (TID) | ORAL | 0 refills | Status: DC | PRN

## 2021-05-16 MED ORDER — BENZONATATE 200 MG PO CAPS: 200.0000 mg | ORAL_CAPSULE | Freq: Two times a day (BID) | ORAL | 0 refills | Status: DC

## 2021-05-16 NOTE — Patient Instructions (Addendum)
Great to see you today.  I have refilled the medication(s) we provide.   If labs were collected, we will inform you of lab results once received either by echart message or telephone call.   - echart message- for normal results that have been seen by the patient already.   - telephone call: abnormal results or if patient has not viewed results in their echart.  Buy over the counter- mucinex DM  Start omnicef every 12 hours for 10 days (antibiotic) Steroid shot today to help with symptoms   Tessalon perles and hycodan cough syrup for cough.    Acute Bronchitis, Adult Acute bronchitis is when air tubes in the lungs (bronchi) suddenly get swollen. The condition can make it hard for you to breathe. In adults, acute bronchitis usually goes away within 2 weeks. A cough caused by bronchitis may last up to 3 weeks. Smoking, allergies, and asthma can make the condition worse. What are the causes? Germs that cause cold and flu (viruses). The most common cause of this condition is the virus that causes the common cold. Bacteria. Substances that bother (irritate) the lungs, including: Smoke from cigarettes and other types of tobacco. Dust and pollen. Fumes from chemicals, gases, or burned fuel. Indoor or outdoor air pollution. What increases the risk? A weak body's defense system. This is also called the immune system. Any condition that affects your lungs and breathing, such as asthma. What are the signs or symptoms? A cough. Coughing up clear, yellow, or green mucus. Making high-pitched whistling sounds when you breathe, most often when you breathe out (wheezing). Runny or stuffy nose. Having too much mucus in your lungs (chest congestion). Shortness of breath. Body aches. A sore throat. How is this treated? Acute bronchitis may go away over time without treatment. Your doctor may  tell you to: Drink more fluids. This will help thin your mucus so it is easier to cough up. Use a device that gets medicine into your lungs (inhaler). Use a vaporizer or a humidifier. These are machines that add water to the air. This helps with coughing and poor breathing. Take a medicine that thins mucus and helps clear it from your lungs. Take a medicine that prevents or stops coughing. It is not common to take an antibiotic medicine for this condition. Follow these instructions at home:  Take over-the-counter and prescription medicines only as told by your doctor. Use an inhaler, vaporizer, or humidifier as told by your doctor. Take two teaspoons (10 mL) of honey at bedtime. This helps lessen your coughing at night. Drink enough fluid to keep your pee (urine) pale yellow. Do not smoke or use any products that contain nicotine or tobacco. If you need help quitting, ask your doctor. Get a lot of rest. Return to your normal activities when your doctor says that it is safe. Keep all follow-up visits. How is this prevented?  Wash your hands often with soap and water for at least 20 seconds. If you cannot use soap and water, use hand sanitizer. Avoid contact with people who have cold symptoms. Try not to touch your mouth, nose, or eyes with your hands. Avoid breathing in smoke or chemical fumes. Make sure to get the flu shot every year. Contact a doctor if: Your symptoms do not get better in 2 weeks. You have trouble coughing up the mucus. Your cough keeps you awake at night. You have a fever. Get help right away if: You cough up blood. You have chest pain.  You have very bad shortness of breath. You faint or keep feeling like you are going to faint. You have a very bad headache. Your fever or chills get worse. These symptoms may be an emergency. Get help right away. Call your local emergency services (911 in the [place] Do not wait to see if the symptoms will go away. Do not drive  yourself to the hospital. Summary Acute bronchitis is when air tubes in the lungs (bronchi) suddenly get swollen. In adults, acute bronchitis usually goes away within 2 weeks. Drink more fluids. This will help thin your mucus so it is easier to cough up. Take over-the-counter and prescription medicines only as told by your doctor. Contact a doctor if your symptoms do not improve after 2 weeks of treatment. This information is not intended to replace advice given to you by your health care provider. Make sure you discuss any questions you have with your health care provider. Document Revised: 09/05/2020 Document Reviewed: 09/05/2020 Elsevier Patient Education  [place].
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
## 2021-05-19 LAB — CULTURE, UPPER RESPIRATORY
MICRO NUMBER:: 12809002
SPECIMEN QUALITY:: ADEQUATE

## 2021-06-05 ENCOUNTER — Telehealth: Payer: Self-pay | Admitting: Pulmonary Disease

## 2021-06-05 NOTE — Telephone Encounter (Signed)
Routing to [facility] to see if they tried to call pt to get her scheduled for HRCT. Pt will also need to be scheduled for an appt with Dr. Vaughan Browner after the CT has been scheduled.

## 2021-06-10 NOTE — Telephone Encounter (Signed)
Collette - will you please see if this is one of your CTs that you have to schedule?

## 2021-07-05 NOTE — Telephone Encounter (Signed)
Pt scheduled for CT 3/6. Closing encounter.

## 2021-07-16 ENCOUNTER — Encounter (INDEPENDENT_AMBULATORY_CARE_PROVIDER_SITE_OTHER): Payer: Self-pay | Admitting: Ophthalmology

## 2021-07-16 ENCOUNTER — Ambulatory Visit (INDEPENDENT_AMBULATORY_CARE_PROVIDER_SITE_OTHER): Payer: Medicare Other | Admitting: Ophthalmology

## 2021-07-16 ENCOUNTER — Other Ambulatory Visit: Payer: Self-pay

## 2021-07-16 DIAGNOSIS — H35371 Puckering of macula, right eye: Secondary | ICD-10-CM

## 2021-07-16 DIAGNOSIS — H35372 Puckering of macula, left eye: Secondary | ICD-10-CM | POA: Diagnosis not present
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
## 2021-07-16 NOTE — Assessment & Plan Note (Signed)
Minor no impact on fovea, will continue to observe

## 2021-07-16 NOTE — Assessment & Plan Note (Signed)
Minor OS, no impact on fovea with persistent appearance on nasal and temporal aspect of the fovea

## 2021-07-22 ENCOUNTER — Other Ambulatory Visit: Payer: Self-pay

## 2021-07-22 ENCOUNTER — Ambulatory Visit (HOSPITAL_BASED_OUTPATIENT_CLINIC_OR_DEPARTMENT_OTHER)
Admission: RE | Admit: 2021-07-22 | Discharge: 2021-07-22 | Disposition: A | Discharge status: Home / Self Care | Payer: Medicare Other | Source: Ambulatory Visit | Attending: Pulmonary Disease | Admitting: Pulmonary Disease

## 2021-07-22 DIAGNOSIS — J849 Interstitial pulmonary disease, unspecified: Secondary | ICD-10-CM | POA: Diagnosis not present

## 2021-07-26 ENCOUNTER — Other Ambulatory Visit: Payer: Medicare Other

## 2021-07-29 ENCOUNTER — Encounter: Payer: Self-pay | Admitting: Pulmonary Disease

## 2021-07-29 ENCOUNTER — Ambulatory Visit: Payer: Medicare Other | Admitting: Pulmonary Disease

## 2021-07-29 ENCOUNTER — Other Ambulatory Visit: Payer: Self-pay

## 2021-07-29 VITALS — BP 136/74 | HR 75 | Temp 98.2°F | Ht 61.0 in | Wt 145.0 lb

## 2021-07-29 DIAGNOSIS — J849 Interstitial pulmonary disease, unspecified: Secondary | ICD-10-CM | POA: Diagnosis not present

## 2021-07-29 NOTE — Patient Instructions (Addendum)
Glad you are doing well with regard to breathing. ?I have reviewed his CT scan which shows that the scarring in the lung is stable ?We will order a follow-up high-resolution CT in 1 year ?Return to clinic after CT scan. ?

## 2021-07-29 NOTE — Progress Notes (Signed)
? ?      Angela Preston    [id number]    1940/07/27 ? ?Primary Care Physician:Kuneff, Renee A, DO ? ?Referring Physician: Ma Hillock, DO ?[address] [place] [place],  [place] [address] ? ?Chief complaint: Follow up for pulmonary embolism, interstitial lung disease ? ?HPI: ?81 year old with history of Takotsubo cardiomyopathy, hyperlipidemia, hypertension, recent PE, DVT ? ?Underwent left hip arthroplasty September 2019 and susequentlly developed left leg swelling which worsened.  A lower extremity Doppler on 1/7 showed DVT and placed on Xarelto.  Had a primary care visit with cough, chest congestion.  Noted to have bilateral crackles.  Was treated with Omnicef and Hycodan cough syrup but continues to have persistent symptoms. ?A CTA obtained showed a small nonocclusive thrombus in the right upper lobe and changes concerning for interstitial lung disease and she has been referred to pulmonary for further evaluation. ? ?Chief complaint today is cough, nonproductive in nature.  Mild dyspnea on exertion.  No wheezing, fevers, chills.  She has occasional dysphagia, food getting stuck on swallowing.  Also complains of dry mouth, dry eyes and acid reflux. ? ?Pets: Outside cat, no dogs, birds, farm animals ?Occupation: Lawyer for [employer], worked as a Insurance claims handler.  Currently retired. ?Exposures: No known exposures, no mold, hot tub, Jacuzzi, humidifier ?Smoking history: Never smoker ?Travel history: No significant travel history ?Relevant family history: Brothers and father have emphysema, COPD.  They are all smokers.  Her younger brother is currently hospitalized with spontaneous pneumothorax.  Father had lung issues.  Unclear if he had fibrosis. ? ?Interim history: ?States that breathing is doing well with no issues.  She is here for review of CT scan ? ?Outpatient Encounter Medications as of 07/29/2021  ?Medication Sig  ? carboxymethylcellulose (REFRESH PLUS) 0.5 % SOLN Place 1 drop into both eyes daily.  ?  Cholecalciferol (VITAMIN D3) 250 MCG (10000 UT) capsule Take 10,000 Units by mouth every evening.  ? ezetimibe (ZETIA) 10 MG tablet TAKE 1 TABLET(10 MG) BY MOUTH DAILY  ? icosapent Ethyl (VASCEPA) 1 g capsule Take 2 capsules (2 g total) by mouth 2 (two) times daily.  ? losartan (COZAAR) 50 MG tablet Take 1 tablet (50 mg total) by mouth daily.  ? Multiple Vitamins-Minerals (MULTIVITAMIN WITH MINERALS) tablet Take 1 tablet by mouth daily with lunch.   ? Omega-3 Fatty Acids (FISH OIL) 1000 MG CAPS Take 1,000 mg by mouth 2 (two) times daily.   ? Pitavastatin Calcium 2 MG TABS Take 1 tablet (2 mg total) by mouth daily.  ? triamterene-hydrochlorothiazide (MAXZIDE) 75-50 MG tablet Take 1 tablet by mouth daily.  ? amLODipine (NORVASC) 5 MG tablet Take 1 tablet (5 mg total) by mouth daily.  ? benzonatate (TESSALON) 200 MG capsule Take 1 capsule (200 mg total) by mouth 2 (two) times daily. (Patient not taking: Reported on 07/29/2021)  ? HYDROcodone bit-homatropine (HYCODAN) 5-1.5 MG/5ML syrup Take 5 mLs by mouth every 8 (eight) hours as needed for cough. (Patient not taking: Reported on 07/29/2021)  ? metoprolol succinate (TOPROL-XL) 25 MG 24 hr tablet Take 0.5 tablets (12.5 mg total) by mouth daily. Take with or immediately following a meal.  ? [DISCONTINUED] cefdinir (OMNICEF) 300 MG capsule Take 1 capsule (300 mg total) by mouth 2 (two) times daily.  ? ?No facility-administered encounter medications on file as of 07/29/2021.  ? ?Physical Exam: ?Blood pressure 136/74, pulse 75, temperature 98.2 ?F (36.8 ?C), temperature source Oral, height '5\' 1"'$  (1.549 m), weight 145 lb (  65.8 kg), SpO2 97 %. ?Gen:      No acute distress ?HEENT:  EOMI, sclera anicteric ?Neck:     No masses; no thyromegaly ?Lungs:    Bibasal crackles ?CV:         Regular rate and rhythm; no murmurs ?Abd:      + bowel sounds; soft, non-tender; no palpable masses, no distension ?Ext:    No edema; adequate peripheral perfusion ?Skin:      Warm and dry; no  rash ?Neuro: alert and oriented x 3 ?Psych: normal mood and affect  ? ?Data Reviewed: ?Imaging: ?Lower extremity ultrasound 05/26/2018- acute DVT in the left leg ? ?CTA 06/30/2018- small nonocclusive pulmonary embolism in the right upper lobe, aortic, coronary atherosclerosis, patchy areas of peripheral groundglass and septal thickening bilaterally. ?I have reviewed the images personally. ? ?High-resolution CT 07/19/2018- peripheral and basilar predominant subpleural groundglass, reticulation, traction bronchiectasis.  No honeycombing.  ? 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 ? 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 ?I have reviewed the images personally. ? ?PFTs ?07/20/2018 ?FVC 2.12 [83%], FEV1 1.95 [103%), F/F 92, TLC 73%, DLCO corrected 134% ? ?07/05/2020 ?FVC 2.70 [81%], FEV1 1.82 [100%], F/F 91, TLC 3.23 [65%], DLCO 11.95 [60%] ?Moderate restriction, diffusion defect ? ?01/25/2021 ?FVC 1.87 [82%], FEV1 1.75 [104%], F/F 94, diffusion capacity 12.81 [74%] ?Improvement in diffusion capacity. ? ?Labs ?ILD serologies 07/12/2018- ?Rheumatoid factor-27 ?CCP, ANA, myositis panel, Ro, La, SCL 70, hypersensitivity panel-negative ? 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  Clinically she is asymtomatic ? ?Had a detailed discussion with patient today and discussed options for management ?We can try for lung biopsy or bronchoscopy with envisia classifier or treat with antifibrotics ?After informed decision making she has opted for conservative management since she feels well and does not want to attempt biopsy or treatment. ? ?PFTs show FVC is lower but diffusion capacity has improved.  CT scan  earlier this month shows stable pulmonary fibrosis.  Overall she appears stable with no change in symptoms ? ?DVT PE after hip surgery ?Finished Xarelto for 6 months from January to June 2020 ? ?Right hemidiaphragm elevation ?Unclear etiology. This appears to be chronic dating back to at least 2008 ? ?Plan/Recommendations: ?- Continue conservative management ?- Follow-up high-res CT in 12 months ? ?[name] MD ?[facility] ?07/29/2021, 10:58 AM ? ?CC: Kuneff, Renee A, DO ? ? ?

## 2021-08-12 ENCOUNTER — Encounter: Payer: Self-pay | Admitting: Physician Assistant

## 2021-08-12 ENCOUNTER — Ambulatory Visit: Payer: Medicare Other | Admitting: Physician Assistant

## 2021-08-12 ENCOUNTER — Other Ambulatory Visit: Payer: Self-pay

## 2021-08-12 VITALS — BP 136/74 | HR 58 | Ht 61.0 in | Wt 145.6 lb

## 2021-08-12 DIAGNOSIS — I2510 Atherosclerotic heart disease of native coronary artery without angina pectoris: Secondary | ICD-10-CM

## 2021-08-12 DIAGNOSIS — I5181 Takotsubo syndrome: Secondary | ICD-10-CM

## 2021-08-12 DIAGNOSIS — I10 Essential (primary) hypertension: Secondary | ICD-10-CM

## 2021-08-12 DIAGNOSIS — E785 Hyperlipidemia, unspecified: Secondary | ICD-10-CM

## 2021-08-12 DIAGNOSIS — I700 Atherosclerosis of aorta: Secondary | ICD-10-CM

## 2021-08-12 MED ORDER — PITAVASTATIN CALCIUM 2 MG PO TABS: 2.0000 mg | ORAL_TABLET | Freq: Every day | ORAL | 3 refills | Status: DC
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
## 2021-08-12 NOTE — Patient Instructions (Signed)
Medication Instructions:  ?RESTART Pitavastatin 2 mg daily  ? ?*If you need a refill on your cardiac medications before your next appointment, please call your pharmacy* ? ?Lab Work: ?Your physician recommends that you return for lab work in 2 months:  ?Fasting Lipid Panel-DO NO EAT OR DRINK PAST MIDNIGHT. OKAY TO HAVE WATER AND/OR BLACK COFFEE ONLY. ?Hepatic (Liver) Function Test  ? ?If you have labs (blood work) drawn today and your tests are completely normal, you will receive your results only by: ?MyChart Message (if you have MyChart) OR ?A paper copy in the mail ?If you have any lab test that is abnormal or we need to change your treatment, we will call you to review the results. ? ?Testing/Procedures: ?NONE ordered at this time of appointment  ? ?Follow-Up: ?At [facility], you and your health needs are our priority.  As part of our continuing mission to provide you with exceptional heart care, we have created designated Provider Care Teams.  These Care Teams include your primary Cardiologist (physician) and Advanced Practice Providers (APPs -  Physician Assistants and Nurse Practitioners) who all work together to provide you with the care you need, when you need it. ? ?We recommend signing up for the patient portal called "MyChart".  Sign up information is provided on this After Visit Summary.  MyChart is used to connect with patients for Virtual Visits (Telemedicine).  Patients are able to view lab/test results, encounter notes, upcoming appointments, etc.  Non-urgent messages can be sent to your provider as well.   ?To learn more about what you can do with MyChart, go to [email or web address].   ? ?Your next appointment:   ?4-5 month(s) ? ?The format for your next appointment:   ?In Person ? ?Provider:   ?Shelva Majestic, MD   ? ?Other Instructions ? ? ?

## 2021-08-14 ENCOUNTER — Encounter: Payer: Self-pay | Admitting: Physician Assistant

## 2021-08-15 ENCOUNTER — Ambulatory Visit (INDEPENDENT_AMBULATORY_CARE_PROVIDER_SITE_OTHER): Payer: Medicare Other

## 2021-08-15 ENCOUNTER — Other Ambulatory Visit (HOSPITAL_BASED_OUTPATIENT_CLINIC_OR_DEPARTMENT_OTHER): Payer: Self-pay

## 2021-08-15 DIAGNOSIS — M810 Age-related osteoporosis without current pathological fracture: Secondary | ICD-10-CM

## 2021-08-15 DIAGNOSIS — Z1211 Encounter for screening for malignant neoplasm of colon: Secondary | ICD-10-CM

## 2021-08-15 DIAGNOSIS — Z23 Encounter for immunization: Secondary | ICD-10-CM

## 2021-08-15 DIAGNOSIS — Z1239 Encounter for other screening for malignant neoplasm of breast: Secondary | ICD-10-CM

## 2021-08-15 DIAGNOSIS — Z0000 Encounter for general adult medical examination without abnormal findings: Secondary | ICD-10-CM

## 2021-08-15 DIAGNOSIS — K635 Polyp of colon: Secondary | ICD-10-CM

## 2021-08-15 MED ORDER — TETANUS-DIPHTH-ACELL PERTUSSIS 5-2.5-18.5 LF-MCG/0.5 IM SUSP
0.5000 mL | Freq: Once | INTRAMUSCULAR | 0 refills | Status: AC
  Filled 2021-08-15: qty 0.5, 1d supply, fill #0

## 2021-08-15 NOTE — Patient Instructions (Addendum)
?  Angela Preston , ?Thank you for taking time to come for your Medicare Wellness Visit. I appreciate your ongoing commitment to your health goals. Please review the following plan we discussed and let me know if I can assist you in the future.  ? ?These are the goals we discussed: ? Goals   ? ?  Exercise 150 minutes per week (moderate activity)   ?  Increase walking.  ?  ?  Patient Stated   ?  Increase walking after hip surgery.  ?  ? ?  ?  ?This is a list of the screening recommended for you and due dates:  ?Health Maintenance  ?Topic Date Due  ? Zoster (Shingles) Vaccine (1 of 2) Never done  ? DEXA scan (bone density measurement)  05/20/2015  ? Pneumonia Vaccine (2 - PPSV23 if available, else PCV20) 08/21/2016  ? Colon Cancer Screening  07/17/2017  ? Tetanus Vaccine  05/19/2021  ? COVID-19 Vaccine (3 - Moderna risk series) 08/31/2021*  ? Mammogram  10/24/2022  ? Flu Shot  Completed  ? HPV Vaccine  Aged Out  ?*Topic was postponed. The date shown is not the original due date.  ?  ?
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
## 2021-08-16 ENCOUNTER — Telehealth (HOSPITAL_BASED_OUTPATIENT_CLINIC_OR_DEPARTMENT_OTHER): Payer: Self-pay

## 2021-08-18 ENCOUNTER — Other Ambulatory Visit: Payer: Self-pay | Admitting: Cardiovascular Disease

## 2021-08-19 ENCOUNTER — Telehealth: Payer: Self-pay | Admitting: Cardiovascular Disease

## 2021-08-19 NOTE — Telephone Encounter (Signed)
Returned call to patient, she states she restarted medication and she was unable to tolerate it.  She had dizziness, itchy scalp, cramps in legs and feet.  She took for 3 days and stopped.   Symptoms resolved once stopping the medication.   ? ?Advised would route to [facility] PA to make aware and for further recommendations.  ?

## 2021-08-19 NOTE — Addendum Note (Signed)
Addended by: Patria Mane A on: 08/19/2021 12:48 PM ? ? Modules accepted: Orders ? ?

## 2021-08-19 NOTE — Telephone Encounter (Signed)
Pt c/o medication issue: ? ?1. Name of Medication: Pitavastatin Calcium 2 MG TABS ? ?2. How are you currently taking this medication (dosage and times per day)?  Take 1 tablet (2 mg total) by mouth daily. ? ?3. Are you having a reaction (difficulty breathing--STAT)?  ? ?4. What is your medication issue? Patient states she stop taking this medication. It was giving her cramps in her feet and ankles.  She was making itchy all over, mainly in her scalp. She was dizzy.    ?

## 2021-08-19 NOTE — Telephone Encounter (Signed)
Thanks for letting me know. Ok to stop pitavastatin for now, followed up with Dr. Claiborne Billings in July as previously scheduled ?

## 2021-08-19 NOTE — Telephone Encounter (Signed)
Patient aware and verbalized understanding. Med list updated.  ?

## 2021-08-30 ENCOUNTER — Other Ambulatory Visit: Payer: Self-pay | Admitting: Cardiovascular Disease

## 2021-10-04 ENCOUNTER — Encounter: Payer: Self-pay | Admitting: Gastroenterology

## 2021-10-28 ENCOUNTER — Encounter (HOSPITAL_BASED_OUTPATIENT_CLINIC_OR_DEPARTMENT_OTHER): Payer: Self-pay

## 2021-10-28 ENCOUNTER — Ambulatory Visit (HOSPITAL_BASED_OUTPATIENT_CLINIC_OR_DEPARTMENT_OTHER)
Admission: RE | Admit: 2021-10-28 | Discharge: 2021-10-28 | Disposition: A | Discharge status: Home / Self Care | Payer: Medicare Other | Source: Ambulatory Visit | Attending: Family Medicine | Admitting: Family Medicine

## 2021-10-28 DIAGNOSIS — N959 Unspecified menopausal and perimenopausal disorder: Secondary | ICD-10-CM | POA: Insufficient documentation

## 2021-10-28 DIAGNOSIS — Z1239 Encounter for other screening for malignant neoplasm of breast: Secondary | ICD-10-CM

## 2021-10-28 DIAGNOSIS — M810 Age-related osteoporosis without current pathological fracture: Secondary | ICD-10-CM | POA: Diagnosis present

## 2021-10-28 DIAGNOSIS — Z8262 Family history of osteoporosis: Secondary | ICD-10-CM | POA: Diagnosis not present

## 2021-10-28 DIAGNOSIS — E288 Other ovarian dysfunction: Secondary | ICD-10-CM | POA: Insufficient documentation

## 2021-10-28 DIAGNOSIS — Z780 Asymptomatic menopausal state: Secondary | ICD-10-CM | POA: Insufficient documentation

## 2021-10-28 DIAGNOSIS — Z1231 Encounter for screening mammogram for malignant neoplasm of breast: Secondary | ICD-10-CM | POA: Diagnosis not present

## 2021-10-28 DIAGNOSIS — E58 Dietary calcium deficiency: Secondary | ICD-10-CM | POA: Diagnosis not present

## 2021-10-28 DIAGNOSIS — Z90710 Acquired absence of both cervix and uterus: Secondary | ICD-10-CM | POA: Diagnosis not present

## 2021-10-28 DIAGNOSIS — M859 Disorder of bone density and structure, unspecified: Secondary | ICD-10-CM | POA: Insufficient documentation

## 2021-10-29 ENCOUNTER — Telehealth: Payer: Self-pay | Admitting: Family Medicine

## 2021-10-29 DIAGNOSIS — M810 Age-related osteoporosis without current pathological fracture: Secondary | ICD-10-CM

## 2021-10-29 NOTE — Telephone Encounter (Signed)
Spoke with pt regarding labs and instructions.   

## 2021-10-29 NOTE — Telephone Encounter (Signed)
Please inform patient: Her bone density did result in osteoporosis, which she is aware she has had osteoporosis.  From her last bone density in 2014, there has been some increased in the density-which is good.  Continue vitamin D supplements Continue routine weightbearing exercise.

## 2021-11-14 ENCOUNTER — Encounter: Payer: Self-pay | Admitting: Gastroenterology

## 2021-11-14 ENCOUNTER — Other Ambulatory Visit (INDEPENDENT_AMBULATORY_CARE_PROVIDER_SITE_OTHER): Payer: Medicare Other

## 2021-11-14 ENCOUNTER — Ambulatory Visit: Payer: Medicare Other | Admitting: Gastroenterology

## 2021-11-14 DIAGNOSIS — R197 Diarrhea, unspecified: Secondary | ICD-10-CM

## 2021-11-14 DIAGNOSIS — R194 Change in bowel habit: Secondary | ICD-10-CM

## 2021-11-14 DIAGNOSIS — K921 Melena: Secondary | ICD-10-CM

## 2021-11-14 LAB — COMPREHENSIVE METABOLIC PANEL
ALT: 12 U/L (ref 0–35)
AST: 15 U/L (ref 0–37)
Albumin: 4.5 g/dL (ref 3.5–5.2)
Alkaline Phosphatase: 70 U/L (ref 39–117)
BUN: 20 mg/dL (ref 6–23)
CO2: 29 mEq/L (ref 19–32)
Calcium: 10.2 mg/dL (ref 8.4–10.5)
Chloride: 101 mEq/L (ref 96–112)
Creatinine, Ser: 0.94 mg/dL (ref 0.40–1.20)
GFR: 56.56 mL/min — ABNORMAL LOW (ref >60.00)
Glucose, Bld: 102 mg/dL — ABNORMAL HIGH (ref 70–99)
Potassium: 3.7 mEq/L (ref 3.5–5.1)
Sodium: 139 mEq/L (ref 135–145)
Total Bilirubin: 0.5 mg/dL (ref 0.2–1.2)
Total Protein: 7.7 g/dL (ref 6.0–8.3)

## 2021-11-14 LAB — CBC WITH DIFFERENTIAL/PLATELET
Basophils Absolute: 0.1 10*3/uL (ref 0.0–0.1)
Basophils Relative: 0.7 % (ref 0.0–3.0)
Eosinophils Absolute: 0.5 10*3/uL (ref 0.0–0.7)
Eosinophils Relative: 4.9 % (ref 0.0–5.0)
HCT: 38.3 % (ref 36.0–46.0)
Hemoglobin: 12.8 g/dL (ref 12.0–15.0)
Lymphocytes Relative: 35 % (ref 12.0–46.0)
Lymphs Abs: 3.5 10*3/uL (ref 0.7–4.0)
MCHC: 33.3 g/dL (ref 30.0–36.0)
MCV: 94.5 fl (ref 78.0–100.0)
Monocytes Absolute: 0.9 10*3/uL (ref 0.1–1.0)
Monocytes Relative: 8.6 % (ref 3.0–12.0)
Neutro Abs: 5.1 10*3/uL (ref 1.4–7.7)
Neutrophils Relative %: 50.8 % (ref 43.0–77.0)
Platelets: 315 10*3/uL (ref 150.0–400.0)
RBC: 4.05 Mil/uL (ref 3.87–5.11)
RDW: 12.7 % (ref 11.5–15.5)
WBC: 10.1 10*3/uL (ref 4.0–10.5)

## 2021-11-14 MED ORDER — NA SULFATE-K SULFATE-MG SULF 17.5-3.13-1.6 GM/177ML PO SOLN: 1.0000 | Freq: Once | ORAL | 0 refills | Status: AC
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
## 2021-11-14 NOTE — Patient Instructions (Addendum)
The [facility] providers would like to encourage you to use [facility] to communicate with providers for non-urgent requests or questions.  Due to long hold times on the telephone, sending your provider a message by [facility] may be a faster and more efficient way to get a response.  Please allow 48 business hours for a response.  Please remember that this is for non-urgent requests.  _______________________________________________________  Your provider has requested that you go to the basement level for lab work before leaving today. Press "B" on the elevator. The lab is located at the first door on the left as you exit the elevator.  You can take over the counter Kaopectate or Imodium for diarrhea.  You have been scheduled for a colonoscopy. Please follow written instructions given to you at your visit today.  Please pick up your prep supplies at the pharmacy within the next 1-3 days. If you use inhalers (even only as needed), please bring them with you on the day of your procedure.  Due to recent changes in healthcare laws, you may see the results of your imaging and laboratory studies on MyChart before your provider has had a chance to review them.  We understand that in some cases there may be results that are confusing or concerning to you. Not all laboratory results come back in the same time frame and the provider may be waiting for multiple results in order to interpret others.  Please give [place] 48 hours in order for your provider to thoroughly review all the results before contacting the office for clarification of your results.   Thank you for choosing me and [facility].  Pricilla Riffle. Dagoberto Ligas., MD., [facility] Regal

## 2021-11-15 ENCOUNTER — Other Ambulatory Visit: Payer: Medicare Other

## 2021-11-15 DIAGNOSIS — R197 Diarrhea, unspecified: Secondary | ICD-10-CM

## 2021-11-15 DIAGNOSIS — K921 Melena: Secondary | ICD-10-CM

## 2021-11-15 DIAGNOSIS — R194 Change in bowel habit: Secondary | ICD-10-CM

## 2021-11-15 LAB — TISSUE TRANSGLUTAMINASE, IGA: (tTG) Ab, IgA: <1 U/mL

## 2021-11-15 LAB — IGA: Immunoglobulin A: 427 mg/dL — ABNORMAL HIGH (ref 70–320)

## 2021-11-17 LAB — GI PROFILE, STOOL, PCR

## 2021-11-20 ENCOUNTER — Encounter: Payer: Self-pay | Admitting: Gastroenterology

## 2021-11-20 ENCOUNTER — Telehealth: Payer: Self-pay

## 2021-11-20 LAB — CALPROTECTIN, FECAL: Calprotectin, Fecal: 32 ug/g (ref 0–120)

## 2021-11-20 NOTE — Telephone Encounter (Signed)
Received call from [facility] that patient's stool test is positive for Norovirus GI.

## 2021-11-20 NOTE — Telephone Encounter (Signed)
Inbound call from patient returning call. Please give patient a call back to advise.

## 2021-11-20 NOTE — Telephone Encounter (Signed)
Spoke with patient regarding results. Patient advised to continue to drink plenty of fluids & keep procedure scheduled for next week.

## 2021-11-20 NOTE — Telephone Encounter (Signed)
Left message for patient to call back  

## 2021-11-26 ENCOUNTER — Other Ambulatory Visit: Payer: Self-pay

## 2021-11-26 ENCOUNTER — Encounter: Payer: Self-pay | Admitting: Certified Registered Nurse Anesthetist

## 2021-11-26 DIAGNOSIS — E785 Hyperlipidemia, unspecified: Secondary | ICD-10-CM

## 2021-11-27 ENCOUNTER — Ambulatory Visit (AMBULATORY_SURGERY_CENTER): Payer: Medicare Other | Admitting: Gastroenterology

## 2021-11-27 ENCOUNTER — Encounter: Payer: Self-pay | Admitting: Gastroenterology

## 2021-11-27 VITALS — BP 128/62 | HR 59 | Temp 96.8°F | Resp 13 | Ht 60.0 in | Wt 145.0 lb

## 2021-11-27 DIAGNOSIS — R194 Change in bowel habit: Secondary | ICD-10-CM

## 2021-11-27 DIAGNOSIS — K5730 Diverticulosis of large intestine without perforation or abscess without bleeding: Secondary | ICD-10-CM

## 2021-11-27 DIAGNOSIS — K640 First degree hemorrhoids: Secondary | ICD-10-CM | POA: Diagnosis not present

## 2021-11-27 DIAGNOSIS — D123 Benign neoplasm of transverse colon: Secondary | ICD-10-CM

## 2021-11-27 DIAGNOSIS — R197 Diarrhea, unspecified: Secondary | ICD-10-CM

## 2021-11-27 MED ORDER — SODIUM CHLORIDE 0.9 % IV SOLN: 500.0000 mL | Freq: Once | INTRAVENOUS | Status: DC

## 2021-11-27 NOTE — Progress Notes (Signed)
Report given to PACU, vss 

## 2021-11-27 NOTE — Progress Notes (Signed)
See 11/14/2021 H&P, no changes

## 2021-11-27 NOTE — Patient Instructions (Signed)
**  Handouts given on polyps, hemorrhoids and diverticulosis**   YOU HAD AN ENDOSCOPIC PROCEDURE TODAY AT [facility]:   Refer to the procedure report that was given to you for any specific questions about what was found during the examination.  If the procedure report does not answer your questions, please call your gastroenterologist to clarify.  If you requested that your care partner not be given the details of your procedure findings, then the procedure report has been included in a sealed envelope for you to review at your convenience later.  YOU SHOULD EXPECT: Some feelings of bloating in the abdomen. Passage of more gas than usual.  Walking can help get rid of the air that was put into your GI tract during the procedure and reduce the bloating. If you had a lower endoscopy (such as a colonoscopy or flexible sigmoidoscopy) you may notice spotting of blood in your stool or on the toilet paper. If you underwent a bowel prep for your procedure, you may not have a normal bowel movement for a few days.  Please Note:  You might notice some irritation and congestion in your nose or some drainage.  This is from the oxygen used during your procedure.  There is no need for concern and it should clear up in a day or so.  SYMPTOMS TO REPORT IMMEDIATELY:  Following lower endoscopy (colonoscopy or flexible sigmoidoscopy):  Excessive amounts of blood in the stool  Significant tenderness or worsening of abdominal pains  Swelling of the abdomen that is new, acute  Fever of 100F or higher   For urgent or emergent issues, a gastroenterologist can be reached at any hour by calling [phone]. Do not use MyChart messaging for urgent concerns.    DIET:  We do recommend a small meal at first, but then you may proceed to your regular diet.  Drink plenty of fluids but you should avoid alcoholic beverages for 24 hours.  ACTIVITY:  You should plan to take it easy for the rest of today and you  should NOT DRIVE or use heavy machinery until tomorrow (because of the sedation medicines used during the test).    FOLLOW UP: Our staff will call the number listed on your records the next business day following your procedure.  We will call around 7:15- 8:00 am to check on you and address any questions or concerns that you may have regarding the information given to you following your procedure. If we do not reach you, we will leave a message.  If you develop any symptoms (ie: fever, flu-like symptoms, shortness of breath, cough etc.) before then, please call [phone].  If you test positive for Covid 19 in the 2 weeks post procedure, please call and report this information to [place].    If any biopsies were taken you will be contacted by phone or by letter within the next 1-3 weeks.  Please call us at [phone] if you have not heard about the biopsies in 3 weeks.    SIGNATURES/CONFIDENTIALITY: You and/or your care partner have signed paperwork which will be entered into your electronic medical record.  These signatures attest to the fact that that the information above on your After Visit Summary has been reviewed and is understood.  Full responsibility of the confidentiality of this discharge information lies with you and/or your care-partner.

## 2021-11-27 NOTE — Op Note (Signed)
[place] Patient Name: Angela Preston Procedure Date: 11/27/2021 9:55 AM MRN: [id number] Endoscopist: Ladene Artist , MD Age: 81 Referring MD:  Date of Birth: 04/23/1941 Gender: Female Account #: [id number] Procedure:                Colonoscopy Indications:              Clinically significant diarrhea of unexplained                            origin, Change in bowel habits Medicines:                Monitored Anesthesia Care Procedure:                Pre-Anesthesia Assessment:                           - Prior to the procedure, a History and Physical                            was performed, and patient medications and                            allergies were reviewed. The patient's tolerance of                            previous anesthesia was also reviewed. The risks                            and benefits of the procedure and the sedation                            options and risks were discussed with the patient.                            All questions were answered, and informed consent                            was obtained. Prior Anticoagulants: The patient has                            taken no previous anticoagulant or antiplatelet                            agents. ASA Grade Assessment: III - A patient with                            severe systemic disease. After reviewing the risks                            and benefits, the patient was deemed in                            satisfactory condition to undergo the procedure.  After obtaining informed consent, the colonoscope                            was passed under direct vision. Throughout the                            procedure, the patient's blood pressure, pulse, and                            oxygen saturations were monitored continuously. The                            Olympus [id number]-[id number] (#[id number]) Colonoscope was                            introduced through the anus and  advanced to the the                            cecum, identified by appendiceal orifice and                            ileocecal valve. The ileocecal valve, appendiceal                            orifice, and rectum were photographed. The quality                            of the bowel preparation was good. The colonoscopy                            was somewhat difficult due to significant looping                            and a tortuous colon. The patient tolerated the                            procedure well. Scope In: 10:08:47 AM Scope Out: 10:26:12 AM Scope Withdrawal Time: 0 hours 10 minutes 31 seconds  Total Procedure Duration: 0 hours 17 minutes 25 seconds  Findings:                 The perianal and digital rectal examinations were                            normal.                           A 7 mm polyp was found in the transverse colon. The                            polyp was sessile. The polyp was removed with a                            cold snare. Resection and retrieval were complete.  Multiple small-mouthed diverticula were found in                            the entire colon. There was no evidence of                            diverticular bleeding.                           Internal hemorrhoids were found during                            retroflexion. The hemorrhoids were small and Grade                            I (internal hemorrhoids that do not prolapse).                           The exam was otherwise without abnormality on                            direct and retroflexion views. Random biopsies                            obtained throughout. Complications:            No immediate complications. Estimated blood loss:                            None. Estimated Blood Loss:     Estimated blood loss: none. Impression:               - One 7 mm polyp in the transverse colon, removed                            with a cold snare. Resected  and retrieved.                           - Mild diverticulosis in the entire examined colon.                           - Internal hemorrhoids.                           - The examination was otherwise normal on direct                            and retroflexion views. Recommendation:           - Patient has a contact number available for                            emergencies. The signs and symptoms of potential                            delayed complications were discussed with the  patient. Return to normal activities tomorrow.                            Written discharge instructions were provided to the                            patient.                           - Resume previous diet.                           - Continue present medications.                           - Await pathology results.                           - No repeat colonoscopy due to age. Ladene Artist, MD 11/27/2021 10:29:57 AM This report has been signed electronically.

## 2021-11-27 NOTE — Progress Notes (Signed)
Called to room to assist during endoscopic procedure.  Patient ID and intended procedure confirmed with present staff. Received instructions for my participation in the procedure from the performing physician.  

## 2021-11-28 ENCOUNTER — Telehealth: Payer: Self-pay

## 2021-11-28 NOTE — Telephone Encounter (Signed)
  Follow up Call-     11/27/2021    9:12 AM  Call back number  Post procedure Call Back phone  # [phone]  Permission to leave phone message Yes     Patient questions:  Do you have a fever, pain , or abdominal swelling? No. Pain Score  0 *  Have you tolerated food without any problems? Yes.    Have you been able to return to your normal activities? Yes.    Do you have any questions about your discharge instructions: Diet   No. Medications  No. Follow up visit  No.  Do you have questions or concerns about your Care? No.  Actions: * If pain score is 4 or above: No action needed, pain <4.

## 2021-12-06 LAB — LIPID PANEL
Chol/HDL Ratio: 4.7 ratio — ABNORMAL HIGH (ref 0.0–4.4)
Cholesterol, Total: 211 mg/dL — ABNORMAL HIGH (ref 100–199)
HDL: 45 mg/dL (ref >39)
LDL Chol Calc (NIH): 126 mg/dL — ABNORMAL HIGH (ref 0–99)
Triglycerides: 229 mg/dL — ABNORMAL HIGH (ref 0–149)
VLDL Cholesterol Cal: 40 mg/dL (ref 5–40)

## 2021-12-10 ENCOUNTER — Encounter: Payer: Self-pay | Admitting: Gastroenterology

## 2021-12-13 ENCOUNTER — Ambulatory Visit: Payer: Medicare Other | Admitting: Cardiovascular Disease

## 2021-12-13 ENCOUNTER — Encounter: Payer: Self-pay | Admitting: Cardiovascular Disease

## 2021-12-13 DIAGNOSIS — I700 Atherosclerosis of aorta: Secondary | ICD-10-CM

## 2021-12-13 DIAGNOSIS — F410 Panic disorder [episodic paroxysmal anxiety] without agoraphobia: Secondary | ICD-10-CM

## 2021-12-13 DIAGNOSIS — I10 Essential (primary) hypertension: Secondary | ICD-10-CM

## 2021-12-13 DIAGNOSIS — J849 Interstitial pulmonary disease, unspecified: Secondary | ICD-10-CM

## 2021-12-13 DIAGNOSIS — E785 Hyperlipidemia, unspecified: Secondary | ICD-10-CM | POA: Diagnosis not present

## 2021-12-13 DIAGNOSIS — I5181 Takotsubo syndrome: Secondary | ICD-10-CM

## 2021-12-13 MED ORDER — NEXLIZET 180-10 MG PO TABS: ORAL_TABLET | ORAL | 3 refills | Status: DC

## 2021-12-13 MED ORDER — AMLODIPINE BESYLATE 5 MG PO TABS: 5.0000 mg | ORAL_TABLET | Freq: Every day | ORAL | 1 refills | Status: DC
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
## 2021-12-13 NOTE — Patient Instructions (Addendum)
Medication Instructions:  Take Zetia for the next 2 weeks- with the samples provided of Nexlatol.   After you finish your Zetia in 2 weeks- start Nexlizet 180-10 mg. (This will be sent to your pharmacy)  Increase Amlodipine to 5 mg daily   *If you need a refill on your cardiac medications before your next appointment, please call your pharmacy*   Lab Work: LIPID, CMET in 3-4 months.  (Come back, no lab appointment, fasting- nothing to eat or drink)   If you have labs (blood work) drawn today and your tests are completely normal, you will receive your results only by: [place] (if you have MyChart) OR A paper copy in the mail If you have any lab test that is abnormal or we need to change your treatment, we will call you to review the results.   Follow-Up: At [facility], you and your health needs are our priority.  As part of our continuing mission to provide you with exceptional heart care, we have created designated Provider Care Teams.  These Care Teams include your primary Cardiologist (physician) and Advanced Practice Providers (APPs -  Physician Assistants and Nurse Practitioners) who all work together to provide you with the care you need, when you need it.  We recommend signing up for the patient portal called "MyChart".  Sign up information is provided on this After Visit Summary.  MyChart is used to connect with patients for Virtual Visits (Telemedicine).  Patients are able to view lab/test results, encounter notes, upcoming appointments, etc.  Non-urgent messages can be sent to your provider as well.   To learn more about what you can do with MyChart, go to [email or web address].    Your next appointment:   6 month(s)  The format for your next appointment:   In Person  Provider:   Shelva Majestic, MD

## 2021-12-15 ENCOUNTER — Encounter: Payer: Self-pay | Admitting: Cardiovascular Disease

## 2021-12-24 ENCOUNTER — Telehealth: Payer: Self-pay | Admitting: Cardiovascular Disease

## 2021-12-24 MED ORDER — AMLODIPINE BESYLATE 5 MG PO TABS: 5.0000 mg | ORAL_TABLET | Freq: Every day | ORAL | 3 refills | Status: DC

## 2021-12-24 MED ORDER — NEXLIZET 180-10 MG PO TABS: ORAL_TABLET | ORAL | 3 refills | Status: DC

## 2021-12-24 NOTE — Telephone Encounter (Signed)
Returned call to patient, pharmacy needs another new rx for nexlizet and amlodipine.  Rx sent to pharmacy.

## 2021-12-24 NOTE — Telephone Encounter (Signed)
Pt c/o medication issue:  1. Name of Medication:    2. How are you currently taking this medication (dosage and times per day)?    3. Are you having a reaction (difficulty breathing--STAT)?    4. What is your medication issue?   Patient states during 7/28 appointment with Dr. Claiborne Billings she was advised 2 medications would be sent to her local pharmacy, but were never sent. She is following up to ensure this will be taken care of soon. She thinks one of the medications was Nexletol, but does not remember the other.

## 2022-01-21 ENCOUNTER — Telehealth: Payer: Self-pay | Admitting: Cardiovascular Disease

## 2022-01-21 NOTE — Telephone Encounter (Signed)
Pt would like a callback from nurse to discuss whether or not OTC medication the she bought for leg cramps is okay for her to take. Medication is called Nervive. Please advise

## 2022-01-21 NOTE — Telephone Encounter (Signed)
Patient reports leg cramps getting worse and more frequent. She wants to know if she can take OTC Nervive. She has tried mustard and vinegar with no results. Last CMET was 6/29.

## 2022-01-21 NOTE — Telephone Encounter (Signed)
Patient informed of reply from Dr. Sallyanne Kuster.Angela KitchenMarland KitchenCan try nervive. Would also suggest trying Mag Oxide 400 mg daily. Patient verbalized understanding.

## 2022-01-21 NOTE — Telephone Encounter (Signed)
Can try nervive. Would also suggest trying Mag Oxide 400 mg daily.

## 2022-01-26 ENCOUNTER — Other Ambulatory Visit: Payer: Self-pay | Admitting: Physician Assistant

## 2022-03-14 LAB — LIPID PANEL
Chol/HDL Ratio: 3.9 ratio (ref 0.0–4.4)
Cholesterol, Total: 148 mg/dL (ref 100–199)
HDL: 38 mg/dL — ABNORMAL LOW (ref >39)
LDL Chol Calc (NIH): 71 mg/dL (ref 0–99)
Triglycerides: 239 mg/dL — ABNORMAL HIGH (ref 0–149)
VLDL Cholesterol Cal: 39 mg/dL (ref 5–40)

## 2022-03-14 LAB — COMPREHENSIVE METABOLIC PANEL
ALT: 14 IU/L (ref 0–32)
AST: 21 IU/L (ref 0–40)
Albumin/Globulin Ratio: 1.8 (ref 1.2–2.2)
Albumin: 4.6 g/dL (ref 3.7–4.7)
Alkaline Phosphatase: 49 IU/L (ref 44–121)
BUN/Creatinine Ratio: 27 (ref 12–28)
BUN: 27 mg/dL (ref 8–27)
Bilirubin Total: 0.6 mg/dL (ref 0.0–1.2)
CO2: 25 mmol/L (ref 20–29)
Calcium: 10.1 mg/dL (ref 8.7–10.3)
Chloride: 102 mmol/L (ref 96–106)
Creatinine, Ser: 1.01 mg/dL — ABNORMAL HIGH (ref 0.57–1.00)
Globulin, Total: 2.6 g/dL (ref 1.5–4.5)
Glucose: 102 mg/dL — ABNORMAL HIGH (ref 70–99)
Potassium: 5.3 mmol/L — ABNORMAL HIGH (ref 3.5–5.2)
Sodium: 138 mmol/L (ref 134–144)
Total Protein: 7.2 g/dL (ref 6.0–8.5)
eGFR: 56 mL/min/{1.73_m2} — ABNORMAL LOW (ref >59)

## 2022-05-26 ENCOUNTER — Other Ambulatory Visit: Payer: Self-pay | Admitting: Cardiovascular Disease

## 2022-05-29 ENCOUNTER — Other Ambulatory Visit: Payer: Self-pay | Admitting: Cardiovascular Disease

## 2022-06-02 ENCOUNTER — Telehealth: Payer: Self-pay | Admitting: Cardiovascular Disease

## 2022-06-02 NOTE — Telephone Encounter (Signed)
Spoke to patient, reports being prescribed Nexlizet at last appointment and soon after started experiencing scalp itching and itching all over.  Also reports noticing ankle swelling, fatigue, and back pain.    She reports symptoms increased over the last week.  The itching wakes her up at night.  She reports not taking the medication Saturday and today and symptoms have improved.  Swelling resolved.     Advised to stop medication until OV with Dr. Claiborne Billings 1/31.   If symptoms resolved with stopping then will need to discuss alternative. If symptoms don't resolve, likely not the medication.  Patient agreed and verbalized understanding.     Will route to MD to make aware for upcoming appointment.

## 2022-06-02 NOTE — Telephone Encounter (Signed)
Pt c/o medication issue:  1. Name of Medication: Bempedoic Acid-Ezetimibe (NEXLIZET) 180-10 MG TABS   2. How are you currently taking this medication (dosage and times per day)? Take 1 tablet by mouth daily.   3. Are you having a reaction (difficulty breathing--STAT)?   4. What is your medication issue? Patient states she has been having itching, dizziness,  swelling in her ankles, back pain and muscles spasms, feeling tired and weak.

## 2022-06-18 ENCOUNTER — Ambulatory Visit: Payer: Medicare Other | Attending: Cardiovascular Disease | Admitting: Cardiovascular Disease

## 2022-06-18 ENCOUNTER — Encounter: Payer: Self-pay | Admitting: Cardiovascular Disease

## 2022-06-18 VITALS — BP 147/68 | HR 65 | Ht 60.0 in | Wt 148.2 lb

## 2022-06-18 DIAGNOSIS — I700 Atherosclerosis of aorta: Secondary | ICD-10-CM | POA: Diagnosis not present

## 2022-06-18 DIAGNOSIS — I10 Essential (primary) hypertension: Secondary | ICD-10-CM | POA: Diagnosis not present

## 2022-06-18 DIAGNOSIS — E785 Hyperlipidemia, unspecified: Secondary | ICD-10-CM | POA: Diagnosis not present

## 2022-06-18 DIAGNOSIS — E782 Mixed hyperlipidemia: Secondary | ICD-10-CM | POA: Diagnosis not present

## 2022-06-18 DIAGNOSIS — J849 Interstitial pulmonary disease, unspecified: Secondary | ICD-10-CM

## 2022-06-18 DIAGNOSIS — I5181 Takotsubo syndrome: Secondary | ICD-10-CM

## 2022-06-18 DIAGNOSIS — F410 Panic disorder [episodic paroxysmal anxiety] without agoraphobia: Secondary | ICD-10-CM

## 2022-06-18 MED ORDER — EZETIMIBE 10 MG PO TABS: 10.0000 mg | ORAL_TABLET | Freq: Every day | ORAL | 3 refills | Status: DC
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
## 2022-06-18 NOTE — Patient Instructions (Addendum)
Medication Instructions:  START Zetia 10 mg daily  If blood pressure consistently >135/140 (top number), ok to increase Losartan to 75 mg daily-please call to let us know.  *If you need a refill on your cardiac medications before your next appointment, please call your pharmacy*   Lab Work: Please return for FASTING labs in 3-4 months  (CMET, Lipid)  Our in office lab hours are Monday-Friday 8:00-4:00, closed for lunch 12:45-1:45 pm.  No appointment needed.  [facility] locations:   [place] - [address] [place] [address] - [address] [place] - [address] ([facility])  Follow-Up: At [facility], you and your health needs are our priority.  As part of our continuing mission to provide you with exceptional heart care, we have created designated Provider Care Teams.  These Care Teams include your primary Cardiologist (physician) and Advanced Practice Providers (APPs -  Physician Assistants and Nurse Practitioners) who all work together to provide you with the care you need, when you need it.  We recommend signing up for the patient portal called "MyChart".  Sign up information is provided on this After Visit Summary.  MyChart is used to connect with patients for Virtual Visits (Telemedicine).  Patients are able to view lab/test results, encounter notes, upcoming appointments, etc.  Non-urgent messages can be sent to your provider as well.   To learn more about what you can do with MyChart, go to [email or web address].    Your next appointment:   6 month(s)  Provider:   Shelva Majestic, MD

## 2022-06-19 ENCOUNTER — Encounter: Payer: Self-pay | Admitting: Cardiovascular Disease

## 2022-06-20 NOTE — Telephone Encounter (Signed)
Discussed at OV

## 2022-07-05 ENCOUNTER — Encounter (HOSPITAL_BASED_OUTPATIENT_CLINIC_OR_DEPARTMENT_OTHER): Payer: Self-pay

## 2022-07-05 ENCOUNTER — Other Ambulatory Visit: Payer: Self-pay

## 2022-07-05 ENCOUNTER — Emergency Department (HOSPITAL_BASED_OUTPATIENT_CLINIC_OR_DEPARTMENT_OTHER)
Admission: EM | Admit: 2022-07-05 | Discharge: 2022-07-05 | Disposition: A | Discharge status: Home / Self Care | Payer: Medicare Other | Attending: Emergency Medicine | Admitting: Emergency Medicine

## 2022-07-05 DIAGNOSIS — T783XXA Angioneurotic edema, initial encounter: Secondary | ICD-10-CM | POA: Diagnosis not present

## 2022-07-05 DIAGNOSIS — I10 Essential (primary) hypertension: Secondary | ICD-10-CM | POA: Diagnosis not present

## 2022-07-05 DIAGNOSIS — R220 Localized swelling, mass and lump, head: Secondary | ICD-10-CM | POA: Diagnosis present

## 2022-07-05 DIAGNOSIS — Z79899 Other long term (current) drug therapy: Secondary | ICD-10-CM | POA: Diagnosis not present

## 2022-07-05 MED ORDER — PREDNISONE 10 MG PO TABS: 40.0000 mg | ORAL_TABLET | Freq: Every day | ORAL | 0 refills | Status: AC

## 2022-07-05 MED ORDER — METHYLPREDNISOLONE SODIUM SUCC 125 MG IJ SOLR
125.0000 mg | Freq: Once | INTRAMUSCULAR | Status: AC
  Administered 2022-07-05: 125 mg via INTRAVENOUS
  Filled 2022-07-05: qty 2

## 2022-07-05 MED ORDER — DIPHENHYDRAMINE HCL 50 MG/ML IJ SOLN
25.0000 mg | Freq: Once | INTRAMUSCULAR | Status: AC
  Administered 2022-07-05: 25 mg via INTRAVENOUS
  Filled 2022-07-05: qty 1

## 2022-07-05 NOTE — ED Notes (Signed)
Pt in bed, sig other at bedside, pt reports decreased burning, pt reports she only has burning on the R face, pt reports a decrease in swelling to upper lip.  Pt denies tongue or throat swelling, resps even and unlabored. Drink given, pt has no other requests at this time
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
## 2022-07-05 NOTE — ED Triage Notes (Signed)
Pt to er, pt states that she woke this am with some lip swelling, states that now it is her face, states that she doesn't have any swelling in her throat, denies any difficulty breathing, pt normal voice, resps even and unlabored.

## 2022-07-05 NOTE — ED Notes (Signed)
Pt in bed, pt states that she is feeling a little bit better, states that she has decreased burning in face.  Pt has no requests at this time.

## 2022-07-05 NOTE — Discharge Instructions (Addendum)
Take benadryl 66m every 6 hours for 48 hours.

## 2022-07-05 NOTE — ED Notes (Signed)
Pt sitting in bed, pt reports decreased swelling in face, pt states that she is ready to go home, pt verbalized understanding d/c and follow up, pt from dpt with sig other.

## 2022-07-15 ENCOUNTER — Encounter (INDEPENDENT_AMBULATORY_CARE_PROVIDER_SITE_OTHER): Payer: Medicare Other | Admitting: Ophthalmology

## 2022-07-16 ENCOUNTER — Inpatient Hospital Stay: Payer: Medicare Other | Admitting: Family Medicine

## 2022-07-21 ENCOUNTER — Other Ambulatory Visit: Payer: Medicare Other

## 2022-07-21 ENCOUNTER — Telehealth: Payer: Self-pay

## 2022-07-21 NOTE — Telephone Encounter (Signed)
LVM for pt to call office.  Wanting to know if pt would like to keep Dr. Raoul Pitch as PCP or I she has found new primary care provider.

## 2022-07-21 NOTE — Telephone Encounter (Signed)
She would like to keep Dr. Raoul Pitch as PCP. She hadnt had the chance to schedule, but she is scheduled for 07/29/22 for hospital follow up

## 2022-07-21 NOTE — Telephone Encounter (Signed)
noted 

## 2022-07-23 ENCOUNTER — Ambulatory Visit (HOSPITAL_BASED_OUTPATIENT_CLINIC_OR_DEPARTMENT_OTHER)
Admission: RE | Admit: 2022-07-23 | Discharge: 2022-07-23 | Disposition: A | Discharge status: Home / Self Care | Payer: Medicare Other | Source: Ambulatory Visit | Attending: Pulmonary Disease | Admitting: Pulmonary Disease

## 2022-07-23 DIAGNOSIS — J849 Interstitial pulmonary disease, unspecified: Secondary | ICD-10-CM | POA: Insufficient documentation

## 2022-07-28 ENCOUNTER — Encounter: Payer: Self-pay | Admitting: Family Medicine

## 2022-07-28 ENCOUNTER — Ambulatory Visit: Payer: Medicare Other | Admitting: Family Medicine

## 2022-07-28 VITALS — BP 136/81 | HR 64 | Temp 97.8°F | Wt 148.0 lb

## 2022-07-28 DIAGNOSIS — T783XXA Angioneurotic edema, initial encounter: Secondary | ICD-10-CM
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
## 2022-07-29 ENCOUNTER — Telehealth: Payer: Self-pay | Admitting: Pulmonary Disease

## 2022-07-29 NOTE — Telephone Encounter (Signed)
I called this PT to set up a recall. She states Dr. Vaughan Browner usually gives her a PFT before each appt. I only saw review of CT scan in AVS notes from her last appt.   Please advise if we need to do a PFT and re-route to the front office pool to resched adding a PFT. If no PFT required please sign encounter.

## 2022-07-29 NOTE — Telephone Encounter (Signed)
Searched patient's last OV and did not see any mentioning of a PFT, just a CT scan.   Will close encounter.

## 2022-08-26 ENCOUNTER — Ambulatory Visit: Payer: Medicare Other | Admitting: Pulmonary Disease

## 2022-08-26 ENCOUNTER — Encounter: Payer: Self-pay | Admitting: Pulmonary Disease

## 2022-08-26 VITALS — BP 122/64 | HR 64 | Temp 98.4°F | Ht 60.0 in | Wt 147.8 lb

## 2022-08-26 DIAGNOSIS — J849 Interstitial pulmonary disease, unspecified: Secondary | ICD-10-CM

## 2022-08-26 NOTE — Progress Notes (Signed)
Angela Preston    [id number]    1940/08/22  Primary Care Physician:Angela Preston, Angela Essex, DO  Referring Physician: Natalia Leatherwood, DO [address] [place] Angela Preston,  [place] [address]  Chief complaint: Follow up for pulmonary embolism, interstitial lung disease  HPI: 82 year old with history of Takotsubo cardiomyopathy, hyperlipidemia, hypertension, recent PE, DVT  Underwent left hip arthroplasty September 2019 and susequentlly developed left leg swelling which worsened.  Preston lower extremity Doppler on 1/7 showed DVT and placed on Xarelto.  Had Preston primary care visit with cough, chest congestion.  Noted to have bilateral crackles.  Was treated with Omnicef and Hycodan cough syrup but continues to have persistent symptoms. Preston CTA obtained showed Preston small nonocclusive thrombus in the right upper lobe and changes concerning for interstitial lung disease and she has been referred to pulmonary for further evaluation.  Chief complaint today is cough, nonproductive in nature.  Mild dyspnea on exertion.  No wheezing, fevers, chills.  She has occasional dysphagia, food getting stuck on swallowing.  Also complains of dry mouth, dry eyes and acid reflux.  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  Currently retired. Exposures: No known exposures, no mold, hot tub, Jacuzzi, humidifier Smoking history: Never smoker Travel history: No significant travel history Relevant family history: Brothers and father have emphysema, COPD.  They are all smokers.  Her younger brother is currently hospitalized with spontaneous pneumothorax.  Father had lung issues.  Unclear if he had fibrosis.  Interim history: States that breathing is doing well with no issues.  She is here for review of CT scan.  Does note mild increase in dyspnea.  Outpatient Encounter Medications as of 08/26/2022  Medication Sig   amLODipine (NORVASC) 5 MG tablet Take 1 tablet (5  mg total) by mouth daily.   carboxymethylcellulose (REFRESH PLUS) 0.5 % SOLN Place 1 drop into both eyes daily.   ezetimibe (ZETIA) 10 MG tablet Take 1 tablet (10 mg total) by mouth daily.   icosapent Ethyl (VASCEPA) 1 g capsule TAKE 2 CAPSULES(2 GRAMS) BY MOUTH TWICE DAILY   losartan (COZAAR) 50 MG tablet TAKE 1 TABLET(50 MG) BY MOUTH DAILY   metoprolol succinate (TOPROL-XL) 25 MG 24 hr tablet TAKE 1/2 TABLET(12.5 MG) BY MOUTH DAILY WITH OR IMMEDIATELY FOLLOWING Preston MEAL   Misc Natural Products (LUTEIN 20 PO) Take 20 mg by mouth daily.   Multiple Vitamins-Minerals (MULTIVITAMIN WITH MINERALS) tablet Take 1 tablet by mouth daily with lunch.    Omega-3 Fatty Acids (FISH OIL) 1000 MG CAPS Take 1,000 mg by mouth 2 (two) times daily.    triamterene-hydrochlorothiazide (MAXZIDE) 75-50 MG tablet TAKE 1 TABLET BY MOUTH DAILY   No facility-administered encounter medications on file as of 08/26/2022.   Physical Exam: Blood pressure 122/64, pulse 64, temperature 98.4 F (36.9 C), temperature source Oral, height 5' (1.524 m), weight 147 lb 12.8 oz (67 kg), SpO2 98 %. Gen:      No acute distress HEENT:  EOMI, sclera anicteric Neck:     No masses; no thyromegaly Lungs:    Clear to auscultation bilaterally; normal respiratory effort CV:         Regular rate and rhythm; no murmurs Abd:      + bowel sounds; soft, non-tender; no palpable masses, no distension Ext:    No edema; adequate peripheral perfusion Skin:      Warm and dry; no rash  Neuro: alert and oriented x 3 Psych: normal mood and affect   Data Reviewed: Imaging: Lower extremity ultrasound 05/26/2018- acute DVT in the left leg  CTA 06/30/2018- small nonocclusive pulmonary embolism in the right upper lobe, aortic, coronary atherosclerosis, patchy areas of peripheral groundglass and septal thickening bilaterally. I have reviewed the images personally.  High-resolution CT 07/19/2018- peripheral and basilar predominant subpleural groundglass,  reticulation, traction bronchiectasis.  No honeycombing.   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  PFTs 07/20/2018 FVC 2.12 [83%], FEV1 1.95 [103%), F/F 92, TLC 73%, DLCO corrected 134%  07/05/2020 FVC 2.70 [81%], FEV1 1.82 [100%], F/F 91, TLC 3.23 [65%], DLCO 11.95 [60%] Moderate restriction, diffusion defect  01/25/2021 FVC 1.87 [82%], FEV1 1.75 [104%], F/F 94, diffusion capacity 12.81 [74%] Improvement in diffusion capacity.  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  Clinically she is asymtomatic  Had Preston detailed discussion with patient and discussed options for management We can try for lung biopsy or bronchoscopy with envisia classifier or treat with antifibrotics After informed decision making she has opted for conservative management since she feels well and does not want to attempt biopsy or treatment.  CT is stable.  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

## 2022-08-26 NOTE — Patient Instructions (Signed)
I reviewed the CT scan which shows stable lung fibrosis Will continue to monitor this Will order pulmonary function test and follow-up in 6 months

## 2022-09-17 ENCOUNTER — Ambulatory Visit (HOSPITAL_BASED_OUTPATIENT_CLINIC_OR_DEPARTMENT_OTHER)
Admission: RE | Admit: 2022-09-17 | Discharge: 2022-09-17 | Disposition: A | Discharge status: Home / Self Care | Payer: Medicare Other | Source: Ambulatory Visit | Attending: Family Medicine | Admitting: Family Medicine

## 2022-09-17 ENCOUNTER — Telehealth: Payer: Self-pay | Admitting: Family Medicine

## 2022-09-17 ENCOUNTER — Encounter: Payer: Self-pay | Admitting: Family Medicine

## 2022-09-17 ENCOUNTER — Ambulatory Visit (INDEPENDENT_AMBULATORY_CARE_PROVIDER_SITE_OTHER): Payer: Medicare Other

## 2022-09-17 ENCOUNTER — Ambulatory Visit: Payer: Medicare Other | Admitting: Family Medicine

## 2022-09-17 VITALS — BP 133/81 | HR 57 | Temp 98.0°F | Wt 149.0 lb

## 2022-09-17 VITALS — BP 133/81 | HR 57 | Temp 98.0°F | Ht 60.0 in | Wt 149.0 lb

## 2022-09-17 DIAGNOSIS — R051 Acute cough: Secondary | ICD-10-CM | POA: Insufficient documentation

## 2022-09-17 DIAGNOSIS — Z0000 Encounter for general adult medical examination without abnormal findings: Secondary | ICD-10-CM | POA: Diagnosis not present

## 2022-09-17 DIAGNOSIS — R220 Localized swelling, mass and lump, head: Secondary | ICD-10-CM

## 2022-09-17 DIAGNOSIS — T783XXA Angioneurotic edema, initial encounter: Secondary | ICD-10-CM | POA: Insufficient documentation

## 2022-09-17 DIAGNOSIS — R600 Localized edema: Secondary | ICD-10-CM

## 2022-09-17 LAB — B12 AND FOLATE PANEL
Folate: 7.6 ng/mL
Vitamin B-12: 228 pg/mL (ref 211–911)

## 2022-09-17 LAB — COMPREHENSIVE METABOLIC PANEL
ALT: 12 U/L (ref 0–35)
AST: 15 U/L (ref 0–37)
Albumin: 4.2 g/dL (ref 3.5–5.2)
Alkaline Phosphatase: 77 U/L (ref 39–117)
BUN: 26 mg/dL — ABNORMAL HIGH (ref 6–23)
CO2: 27 mEq/L (ref 19–32)
Calcium: 9.7 mg/dL (ref 8.4–10.5)
Chloride: 104 mEq/L (ref 96–112)
Creatinine, Ser: 1.11 mg/dL (ref 0.40–1.20)
GFR: 46.06 mL/min — ABNORMAL LOW (ref >60.00)
Glucose, Bld: 102 mg/dL — ABNORMAL HIGH (ref 70–99)
Potassium: 5 mEq/L (ref 3.5–5.1)
Sodium: 140 mEq/L (ref 135–145)
Total Bilirubin: 0.4 mg/dL (ref 0.2–1.2)
Total Protein: 7.3 g/dL (ref 6.0–8.3)

## 2022-09-17 LAB — CBC WITH DIFFERENTIAL/PLATELET
Basophils Absolute: 0.2 10*3/uL — ABNORMAL HIGH (ref 0.0–0.1)
Basophils Relative: 2.3 % (ref 0.0–3.0)
Eosinophils Absolute: 0.6 10*3/uL (ref 0.0–0.7)
Eosinophils Relative: 6.5 % — ABNORMAL HIGH (ref 0.0–5.0)
HCT: 39.7 % (ref 36.0–46.0)
Hemoglobin: 13.1 g/dL (ref 12.0–15.0)
Lymphocytes Relative: 36.3 % (ref 12.0–46.0)
Lymphs Abs: 3.3 10*3/uL (ref 0.7–4.0)
MCHC: 33 g/dL (ref 30.0–36.0)
MCV: 94.3 fl (ref 78.0–100.0)
Monocytes Absolute: 0.8 10*3/uL (ref 0.1–1.0)
Monocytes Relative: 8.5 % (ref 3.0–12.0)
Neutro Abs: 4.3 10*3/uL (ref 1.4–7.7)
Neutrophils Relative %: 46.4 % (ref 43.0–77.0)
Platelets: 312 10*3/uL (ref 150.0–400.0)
RBC: 4.21 Mil/uL (ref 3.87–5.11)
RDW: 12.9 % (ref 11.5–15.5)
WBC: 9.2 10*3/uL (ref 4.0–10.5)

## 2022-09-17 LAB — TSH: TSH: 3.31 u[IU]/mL (ref 0.35–5.50)

## 2022-09-17 MED ORDER — AMLODIPINE BESYLATE 10 MG PO TABS: 10.0000 mg | ORAL_TABLET | Freq: Every day | ORAL | 1 refills | Status: DC

## 2022-09-17 MED ORDER — METHYLPREDNISOLONE ACETATE 80 MG/ML IJ SUSP
80.0000 mg | Freq: Once | INTRAMUSCULAR | Status: AC
Start: 2022-09-17 — End: 2022-09-17
  Administered 2022-09-17: 80 mg via INTRAMUSCULAR
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
## 2022-09-17 NOTE — Patient Instructions (Signed)

## 2022-09-17 NOTE — Telephone Encounter (Signed)
Please call patient Her chest x-ray did not show evidence of fluid or pneumonia, per radiology reading. -Lab results will not be back for a few days.  Once we are able to get those back we will make further decision on next step in workup.  In the meantime: -Stop losartan, increased amlodipine dose to 10 mg a day to make the difference in blood pressure regimen.  Angela Preston can use 2 of the amlodipine 5 mg Angela Preston is currently prescribed, until Angela Preston is able to pick up the new prescription of amlodipine 10 mg tabs for her to take moving forward. -Monitor for any fever, chills, ongoing cough, shortness of breath, wheezing or pneumonia-like symptoms.  If Angela Preston experiences any of these I recommend Angela Preston come in to be seen immediately so we can repeat the chest x-ray and cover with antibiotics if necessary.

## 2022-09-17 NOTE — Patient Instructions (Signed)
No follow-ups on file.        Great to see you today.  I have refilled the medication(s) we provide.   If labs were collected, we will inform you of lab results once received either by echart message or telephone call.   - echart message- for normal results that have been seen by the patient already.   - telephone call: abnormal results or if patient has not viewed results in their echart.  
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
## 2022-09-17 NOTE — Telephone Encounter (Signed)
Spoke with patient regarding results/recommendations.  

## 2022-09-18 ENCOUNTER — Other Ambulatory Visit: Payer: Self-pay

## 2022-09-18 ENCOUNTER — Telehealth: Payer: Self-pay | Admitting: Family Medicine

## 2022-09-18 DIAGNOSIS — E538 Deficiency of other specified B group vitamins: Secondary | ICD-10-CM

## 2022-09-18 LAB — PRO B NATRIURETIC PEPTIDE: NT-Pro BNP: 270 pg/mL (ref 0–738)

## 2022-09-18 MED ORDER — CYANOCOBALAMIN 1000 MCG/ML IJ SOLN
1000.0000 ug | INTRAMUSCULAR | Status: AC
  Administered 2022-11-24 – 2024-06-08 (×20): 1000 ug via INTRAMUSCULAR

## 2022-09-18 MED ORDER — CYANOCOBALAMIN 1000 MCG/ML IJ SOLN
1000.0000 ug | INTRAMUSCULAR | Status: AC
Start: 2022-09-25 — End: 2022-11-06
  Administered 2022-09-25 – 2022-11-06 (×4): 1000 ug via INTRAMUSCULAR

## 2022-09-18 NOTE — Telephone Encounter (Signed)
Spoke with patient regarding results/recommendations. Pt will do first b12 in office with PCP appt

## 2022-09-18 NOTE — Telephone Encounter (Signed)
Please inform patient Liver function is normal.  Kidney function is stable. Blood cell counts overall are normal but do show elevated eosinophils which are the blood cell responsible for allergy. Thyroid levels are normal The cardiac marker completed to gauge if fluid overload/heart failure is present-this marker is normal which is reassuring. Folate levels are normal. B12 is very low at 228-I would recommend she start B12 injections once every 2 weeks for 4 doses and then once monthly.  There are 2 labs still pending.  We will contact her once we have those results as well.  Follow-up in 1-2 weeks for repeat blood pressure check after switching blood pressure regimen and follow-up on her allergic reaction.  If her allergic reaction symptoms are worsening, then I would want her to follow-up before then.  Follow-up in 3 months on B12 deficiency.

## 2022-09-19 LAB — C1 ESTERASE INHIBITOR: C1INH SerPl-mCnc: 37 mg/dL (ref 21–39)

## 2022-09-21 LAB — VITAMIN B1: Vitamin B1 (Thiamine): 14 nmol/L (ref 8–30)

## 2022-09-21 LAB — C3 AND C4
C3 Complement: 123 mg/dL
C4 Complement: 31 mg/dL

## 2022-09-22 ENCOUNTER — Telehealth: Payer: Self-pay | Admitting: Family Medicine

## 2022-09-22 NOTE — Telephone Encounter (Signed)
Pt advised of results. 

## 2022-09-22 NOTE — Telephone Encounter (Signed)
Please inform patient: The angioedema markers are all normal.

## 2022-09-25 ENCOUNTER — Encounter: Payer: Self-pay | Admitting: Family Medicine

## 2022-09-25 ENCOUNTER — Ambulatory Visit: Payer: Medicare Other | Admitting: Family Medicine

## 2022-09-25 VITALS — BP 132/69 | HR 62 | Temp 98.3°F | Wt 147.4 lb

## 2022-09-25 DIAGNOSIS — T783XXD Angioneurotic edema, subsequent encounter: Secondary | ICD-10-CM | POA: Diagnosis not present

## 2022-09-25 DIAGNOSIS — T783XXA Angioneurotic edema, initial encounter: Secondary | ICD-10-CM | POA: Insufficient documentation

## 2022-09-25 DIAGNOSIS — E538 Deficiency of other specified B group vitamins: Secondary | ICD-10-CM | POA: Diagnosis not present

## 2022-09-25 DIAGNOSIS — I10 Essential (primary) hypertension: Secondary | ICD-10-CM | POA: Diagnosis not present
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
## 2022-09-25 NOTE — Patient Instructions (Addendum)
Return in about 4 days (around 09/29/2022) for lab appt (non-fasting)- must be 8 am .        Randie Heinz to see you today.  I have refilled the medication(s) we provide.   If labs were collected, we will inform you of lab results once received either by echart message or telephone call.   - echart message- for normal results that have been seen by the patient already.   - telephone call: abnormal results or if patient has not viewed results in their echart.

## 2022-09-29 ENCOUNTER — Other Ambulatory Visit (INDEPENDENT_AMBULATORY_CARE_PROVIDER_SITE_OTHER): Payer: Medicare Other

## 2022-09-29 DIAGNOSIS — T783XXD Angioneurotic edema, subsequent encounter: Secondary | ICD-10-CM | POA: Diagnosis not present

## 2022-09-29 LAB — CORTISOL: Cortisol, Plasma: 10.4 ug/dL
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
## 2022-10-07 ENCOUNTER — Encounter: Payer: Self-pay | Admitting: Allergy

## 2022-10-07 ENCOUNTER — Other Ambulatory Visit: Payer: Self-pay

## 2022-10-07 ENCOUNTER — Ambulatory Visit: Payer: Medicare Other | Admitting: Allergy

## 2022-10-07 VITALS — BP 138/76 | HR 62 | Temp 98.1°F | Resp 18 | Ht 61.0 in | Wt 147.2 lb

## 2022-10-07 DIAGNOSIS — T783XXD Angioneurotic edema, subsequent encounter: Secondary | ICD-10-CM

## 2022-10-07 DIAGNOSIS — R748 Abnormal levels of other serum enzymes: Secondary | ICD-10-CM

## 2022-10-07 DIAGNOSIS — T7840XD Allergy, unspecified, subsequent encounter: Secondary | ICD-10-CM | POA: Diagnosis not present

## 2022-10-07 DIAGNOSIS — L299 Pruritus, unspecified: Secondary | ICD-10-CM | POA: Diagnosis not present

## 2022-10-07 MED ORDER — CETIRIZINE HCL 10 MG PO TABS: 10.0000 mg | ORAL_TABLET | Freq: Every day | ORAL | 2 refills | Status: AC

## 2022-10-07 MED ORDER — EPINEPHRINE 0.3 MG/0.3ML IJ SOAJ: 0.3000 mg | INTRAMUSCULAR | 1 refills | Status: AC | PRN

## 2022-10-07 NOTE — Patient Instructions (Addendum)
Swelling episodes Unclear etiology but doesn't seem to be related to foods or environmental allergies. No need for any skin testing.  Keep track of episodes and take pictures. Write down what you had done or eaten that day.  Start zyrtec (cetirizine) 10mg  once a day. If symptoms are not controlled or causes drowsiness let us know. Avoid the following potential triggers: alcohol, tight clothing, NSAIDs, hot showers and getting overheated. See below for proper skin care.   Angioedema may be isolated or associated with urticaria. It may be histamine-induced or bradykinin-mediated. Patients with histamine-induced angioedema are more likely to have associated symptoms of urticaria, pruritus or signs of anaphylaxis. Patients with bradykinin-mediated do not have any symptoms of pruritus or urticaria and they did not improve with Benadryl or Epinephrine.  Avoid ACE-inhibitors.  I have prescribed epinephrine injectable device and demonstrated proper use. For mild symptoms you can take over the counter antihistamines such as Benadryl 1-2 tablets = 25-50mg  and monitor symptoms closely. If symptoms worsen or if you have severe symptoms including breathing issues, throat closure, significant swelling, whole body hives, severe diarrhea and vomiting, lightheadedness then inject epinephrine and seek immediate medical care afterwards. Emergency action plan given.  Get bloodwork We are ordering labs, so please allow 1-2 weeks for the results to come back. With the newly implemented Cures Act, the labs might be visible to you at the same time that they become visible to me. However, I will not address the results until all of the results are back, so please be patient.  In the meantime, continue recommendations in your patient instructions, including avoidance measures (if applicable), until you hear from me.  Follow up in 2 months or sooner if needed.  Skin care recommendations  Bath time: Always use  lukewarm water. AVOID very hot or cold water. Keep bathing time to 5-10 minutes. Do NOT use bubble bath. Use a mild soap and use just enough to wash the dirty areas. Do NOT scrub skin vigorously.  After bathing, pat dry your skin with a towel. Do NOT rub or scrub the skin.  Moisturizers and prescriptions:  ALWAYS apply moisturizers immediately after bathing (within 3 minutes). This helps to lock-in moisture. Use the moisturizer several times a day over the whole body. Good summer moisturizers include: Aveeno, CeraVe, Cetaphil. Good winter moisturizers include: Aquaphor, Vaseline, Cerave, Cetaphil, Eucerin, Vanicream. When using moisturizers along with medications, the moisturizer should be applied about one hour after applying the medication to prevent diluting effect of the medication or moisturize around where you applied the medications. When not using medications, the moisturizer can be continued twice daily as maintenance.  Laundry and clothing: Avoid laundry products with added color or perfumes. Use unscented hypo-allergenic laundry products such as Tide free, Cheer free & gentle, and All free and clear.  If the skin still seems dry or sensitive, you can try double-rinsing the clothes. Avoid tight or scratchy clothing such as wool. Do not use fabric softeners or dyer sheets.

## 2022-10-07 NOTE — Assessment & Plan Note (Signed)
.   See assessment and plan as above. 

## 2022-10-07 NOTE — Assessment & Plan Note (Signed)
4 episodes of lip/facial swelling since February. Initial episode was the worst requiring ER visit, IV solumedrol & Benadryl - resolved after 2 days completely. Denies changes in meds, personal care products. Had a new type of hot chip but subsequent episodes didn't eat this new chip. No family history of angioedema. No personal history of cancer. Not on Ace-inhibitors. Now noticing some itching/burning sensation of the skin but no hives/rash. 2024 bloodwork: CBC diff, TSH, CP, C3, C4, c1 esterase inhibitor - unremarkable.  Unclear etiology but doesn't seem to be related to foods or environmental allergies. No indication for any skin testing.  Keep track of episodes and take pictures. Write down what you had done or eaten that day. Start zyrtec (cetirizine) 10mg  once a day. If symptoms are not controlled or causes drowsiness let us know. Avoid the following potential triggers: alcohol, tight clothing, NSAIDs, hot showers and getting overheated. See below for proper skin care.   Angioedema may be isolated or associated with urticaria. It may be histamine-induced or bradykinin-mediated. Patients with histamine-induced angioedema are more likely to have associated symptoms of urticaria, pruritus or signs of anaphylaxis. Patients with bradykinin-mediated do not have any symptoms of pruritus or urticaria and they did not improve with Benadryl or Epinephrine.  Avoid ACE-inhibitors. I have prescribed epinephrine injectable device and demonstrated proper use. For mild symptoms you can take over the counter antihistamines such as Benadryl 1-2 tablets = 25-50mg  and monitor symptoms closely. If symptoms worsen or if you have severe symptoms including breathing issues, throat closure, significant swelling, whole body hives, severe diarrhea and vomiting, lightheadedness then inject epinephrine and seek immediate medical care afterwards. Emergency action plan given. Get bloodwork to rule out other etiologies.

## 2022-10-09 ENCOUNTER — Ambulatory Visit (INDEPENDENT_AMBULATORY_CARE_PROVIDER_SITE_OTHER): Payer: Medicare Other

## 2022-10-09 DIAGNOSIS — E538 Deficiency of other specified B group vitamins: Secondary | ICD-10-CM

## 2022-10-09 NOTE — Progress Notes (Signed)
Pt here for monthly B12 injection per Kuneff   B12 1000mcg given IM, and pt tolerated injection well.   Next B12 injection scheduled for 2 weeks. 

## 2022-10-20 LAB — LIPID PANEL
Chol/HDL Ratio: 3.7 ratio (ref 0.0–4.4)
Cholesterol, Total: 207 mg/dL — ABNORMAL HIGH (ref 100–199)
HDL: 56 mg/dL (ref >39)
LDL Chol Calc (NIH): 121 mg/dL — ABNORMAL HIGH (ref 0–99)
Triglycerides: 170 mg/dL — ABNORMAL HIGH (ref 0–149)
VLDL Cholesterol Cal: 30 mg/dL (ref 5–40)

## 2022-10-20 LAB — COMPREHENSIVE METABOLIC PANEL
ALT: 15 IU/L (ref 0–32)
AST: 18 IU/L (ref 0–40)
Albumin/Globulin Ratio: 1.5 (ref 1.2–2.2)
Albumin: 4.4 g/dL (ref 3.7–4.7)
Alkaline Phosphatase: 78 IU/L (ref 44–121)
BUN/Creatinine Ratio: 25 (ref 12–28)
BUN: 26 mg/dL (ref 8–27)
Bilirubin Total: 0.4 mg/dL (ref 0.0–1.2)
CO2: 24 mmol/L (ref 20–29)
Calcium: 9.4 mg/dL (ref 8.7–10.3)
Chloride: 102 mmol/L (ref 96–106)
Creatinine, Ser: 1.05 mg/dL — ABNORMAL HIGH (ref 0.57–1.00)
Globulin, Total: 2.9 g/dL (ref 1.5–4.5)
Glucose: 93 mg/dL (ref 70–99)
Potassium: 4.2 mmol/L (ref 3.5–5.2)
Sodium: 140 mmol/L (ref 134–144)
Total Protein: 7.3 g/dL (ref 6.0–8.5)
eGFR: 53 mL/min/{1.73_m2} — ABNORMAL LOW (ref >59)

## 2022-10-23 ENCOUNTER — Ambulatory Visit (INDEPENDENT_AMBULATORY_CARE_PROVIDER_SITE_OTHER): Payer: Medicare Other

## 2022-10-23 DIAGNOSIS — E538 Deficiency of other specified B group vitamins: Secondary | ICD-10-CM

## 2022-10-23 NOTE — Progress Notes (Signed)
Pt here for monthly B12 injection per Dr Kuneff ? ?B12 1000mcg given IM, and pt tolerated injection well. ? ?Next B12 injection scheduled for 2 weeks ? ?

## 2022-10-28 ENCOUNTER — Other Ambulatory Visit: Payer: Self-pay | Admitting: Cardiovascular Disease

## 2022-11-05 LAB — ALPHA-GAL PANEL
Allergen Lamb IgE: 0.12 kU/L — AB
Beef IgE: 0.15 kU/L — AB
IgE (Immunoglobulin E), Serum: 106 IU/mL (ref 6–495)
O215-IgE Alpha-Gal: 0.39 kU/L — AB
Pork IgE: <0.1 kU/L

## 2022-11-05 LAB — PROTEIN ELECTROPHORESIS, URINE REFLEX: Protein, Ur: 6.3 mg/dL

## 2022-11-05 LAB — PROTEIN ELECTROPHORESIS, SERUM
A/G Ratio: 1.4 (ref 0.7–1.7)
Albumin ELP: 4.2 g/dL (ref 2.9–4.4)
Alpha 1: 0.2 g/dL (ref 0.0–0.4)
Alpha 2: 0.7 g/dL (ref 0.4–1.0)
Beta: 1.1 g/dL (ref 0.7–1.3)
Gamma Globulin: 1.1 g/dL (ref 0.4–1.8)
Globulin, Total: 3.1 g/dL (ref 2.2–3.9)
Total Protein: 7.3 g/dL (ref 6.0–8.5)

## 2022-11-05 LAB — TRYPTASE: Tryptase: 32.9 ug/L — ABNORMAL HIGH (ref 2.2–13.2)

## 2022-11-05 LAB — C-REACTIVE PROTEIN: CRP: <1 mg/L (ref 0–10)

## 2022-11-05 LAB — CHRONIC URTICARIA: cu index: 1.8 (ref <10)

## 2022-11-05 LAB — SEDIMENTATION RATE: Sed Rate: 9 mm/hr (ref 0–40)

## 2022-11-05 LAB — ALLERGEN FIRE ANT: I070-IgE Fire Ant (Invicta): <0.1 kU/L

## 2022-11-05 LAB — ALLERGEN HYMENOPTERA PANEL
Bumblebee: <0.1 kU/L
Honeybee IgE: <0.1 kU/L
Hornet, White Face, IgE: <0.1 kU/L
Hornet, Yellow, IgE: <0.1 kU/L
Paper Wasp IgE: <0.1 kU/L
Yellow Jacket, IgE: <0.1 kU/L

## 2022-11-05 LAB — COMPLEMENT COMPONENT C1Q: Complement C1Q: 14.6 mg/dL (ref 10.3–20.5)

## 2022-11-05 LAB — ANA W/REFLEX: Anti Nuclear Antibody (ANA): NEGATIVE

## 2022-11-05 LAB — C1 ESTERASE INHIBITOR, FUNCTIONAL: C1INH Functional/C1INH Total MFr SerPl: >110 %mean normal

## 2022-11-06 ENCOUNTER — Ambulatory Visit (INDEPENDENT_AMBULATORY_CARE_PROVIDER_SITE_OTHER): Payer: Medicare Other

## 2022-11-06 DIAGNOSIS — E538 Deficiency of other specified B group vitamins: Secondary | ICD-10-CM | POA: Diagnosis not present

## 2022-11-06 NOTE — Progress Notes (Signed)
Please call patient.   Autoimmune screener, inflammation markers, chronic urticaria index (checks for autoantibodies that trigger mast cells), stinging insect panel, angioedema (checks for swelling issues) panel, urine test were all normal which is great.   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 No pork, beef, lamb.   Tryptase (checks for mast cell issues and can be high when having an allergic reaction) was elevated. I need for her to get this bloodwork rechecked before the next visit.  Please have patient either come into our office to get labwork drawn or mail the lab order.  Make sure she keeps her July appointment.  Anymore swelling episodes?

## 2022-11-06 NOTE — Progress Notes (Signed)
Pt here for monthly B12 injection per Dr Kuneff ? ?B12 1000mcg given IM, and pt tolerated injection well. ? ?Next B12 injection scheduled for 2 weeks ? ?

## 2022-11-08 NOTE — Progress Notes (Unsigned)
Cardiology Clinic Note   Date: 11/10/2022 ID: ANJANI FEUERBORN, DOB 05/25/1940, MRN [id number]  Primary Cardiologist:  Nicki Guadalajara, MD  Patient Profile    Angela Preston is a 82 y.o. female who presents to the clinic today for evaluation of lower extremity edema.     Past medical history significant for: Nonobstructive CAD. LHC 05/02/2015: Mild nonobstructive CAD.  Severe segmental LV systolic dysfunction and pattern suggestive of stress-induced cardiomyopathy. Stress-induced cardiomyopathy. Echo 05/07/2020: EF 60 to 65%.  Mild concentric LVH.  Grade I DD.  Normal RV function. Hypertension. Hyperlipidemia. Lipid panel 10/20/2022: LDL 121, HDL 56, TG 170, total 207. Interstitial lung disease. GERD. DVT. Venous ultrasound 05/26/2018: Right: No DVT in lower extremity.  Left: Acute DVT involving the left common femoral vein, left femoral vein, left proximal profunda vein, left popliteal vein, left posterior tibial vein, and left peroneal vein.  Acute superficial vein thrombosis involving the left great saphenous vein.  Thrombosis external iliac vein.  Left common iliac vein patent.     History of Present Illness    Angela Preston is a longtime patient of cardiology.  She was previously followed by Dr. Alanda Amass.  She transitioned her care to Dr. Tresa Endo on 04/22/2013.  She has a remote history of 2 prior catheterizations >30 years ago in which she was told her coronaries were normal.  She continues to be followed for the above outlined history.  Patient was last seen in the office by Dr. Tresa Endo on 06/18/2022 for routine follow-up.  She reported significant itching with Nexlizet which resolved with therapy was discontinued.  He reported BP lability with readings from 113/60 to 140s/60-70.  PR office visit was 147/68.  Patient was instructed to monitor BP at home and if consistently >140-150 increase losartan to 75 mg.  Today, patient reports lower extremity edema since December.  She reports  she wakes with normal lower extremities but within 15 minutes of being up edema starts at worsens throughout the day.  She has tried to the knee compression stockings but that only caused edema above her knee.  Periodic elevation throughout the day does not seem to help.  She reports abdominal bloating and early satiety on occasion since the edema started.  No shortness of breath at rest but she does experience dyspnea with heavy exertion but not routine activities.  She feels dyspnea is occurring more easily recently.  She does not weigh daily but her normal weight is approximately 135 pounds.  Today she is 148 pounds.  She restrict dietary sodium.  She reports PCP stopped losartan and increased amlodipine but did not tell her why.  She feels edema is worse since increasing amlodipine at the beginning of this month. She is hypertensive today at 146/76.    ROS: All other systems reviewed and are otherwise negative except as noted in History of Present Illness.  Studies Reviewed       EKG is not ordered today.      Physical Exam    VS:  BP (!) 146/76   Pulse 62   Ht 5' (1.524 m)   Wt 148 lb 12.8 oz (67.5 kg)   SpO2 93%   BMI 29.06 kg/m  , BMI Body mass index is 29.06 kg/m.  GEN: Well nourished, well developed, in no acute distress. Neck: No JVD or carotid bruits. Cardiac:  RRR. No murmurs. No rubs or gallops.   Respiratory:  Respirations regular and unlabored. Clear to auscultation without rales, wheezing or rhonchi.  GI: Soft, nontender, nondistended. Extremities: Radials/DP/PT 2+ and equal bilaterally. No clubbing or cyanosis.  1+ pitting edema bilateral lower extremities to knees. Skin: Warm and dry, no rash. Neuro: Strength intact.  Assessment & Plan    Lower extremity edema.  Patient reports lower extremity edema since December.  She wakes with normal lower extremities but within 15 minutes of being up edema begins to progress.  She is tried compression stockings to her knees  but only push the edema to her thighs.  Periodic elevation throughout the day does not seem to help much.  She reports occasional abdominal bloating and early satiety.  She does not have shortness of breath at rest but has noticed a little more dyspnea with heavier exertion but not with routine activities.  No orthopnea or PND.  She does not weigh daily but reports her normal weight is approximately 135 pounds.  Weight today is 148 pounds.  Lungs clear to auscultation.  1+ pitting edema bilateral lower extremities to knees.  Will get echo to further evaluate dyspnea.  Begin Lasix 40 mg x 3 days then 20 mg thereafter.  Repeat BMP in 1 week.  She will weigh daily (weight log provided) and drop weight log at front desk for my review when she comes in for repeat labs. Hypertension: BP today 146/76.  Patient denies headaches, dizziness or vision changes.  Patient reports PCP stopped losartan and increased amlodipine at the beginning of June.  She feels edema has increased since she went up on the dose of amlodipine.  She is unsure why losartan was stopped.  Will restart losartan at 50 mg daily and decrease amlodipine to 5 mg.  She will maintain a daily BP log until follow-up.  Will get BMP today. Nonobstructive CAD.  Remote history of cardiac catheterizations x 2 >30 years ago and told she had normal coronaries.  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 Patient denies chest pain, tightness, or pressure.  Continue metoprolol, Vascepa, Zetia. Takotsubo cardiomyopathy.  LHC December 2016 showed severe LV dysfunction consistent with stress-induced cardiomyopathy.  Echo December 2021 showed normal LV/RV function with mild LVH, Grade I DD.  See #1.  Hyperlipidemia/statin intolerance due to myalgias.  LDL June 2024 121, not at goal.  Patient is intolerant to statins due to myalgias.  She was unable to tolerate Nexlizet secondary to itching.   Continue Vascepa and Zetia.  Disposition: BMP today.  Echo for DOE.  Restart losartan 50 mg and decrease amlodipine to 5 mg.  BP log provided.  Lasix 40 mg x 3 days then 20 mg thereafter.  Repeat BMP in 1 week.  Will provide weight logs at that time.  Return for follow-up visit on 11/26/2022.         Signed, Etta Grandchild. Harrell Niehoff, DNP, NP-C

## 2022-11-10 ENCOUNTER — Encounter: Payer: Self-pay | Admitting: Student

## 2022-11-10 ENCOUNTER — Other Ambulatory Visit: Payer: Self-pay

## 2022-11-10 ENCOUNTER — Ambulatory Visit: Payer: Medicare Other | Attending: Student | Admitting: Student

## 2022-11-10 VITALS — BP 146/76 | HR 62 | Ht 60.0 in | Wt 148.8 lb

## 2022-11-10 DIAGNOSIS — I10 Essential (primary) hypertension: Secondary | ICD-10-CM

## 2022-11-10 DIAGNOSIS — E785 Hyperlipidemia, unspecified: Secondary | ICD-10-CM

## 2022-11-10 DIAGNOSIS — T466X5A Adverse effect of antihyperlipidemic and antiarteriosclerotic drugs, initial encounter: Secondary | ICD-10-CM

## 2022-11-10 DIAGNOSIS — R0609 Other forms of dyspnea: Secondary | ICD-10-CM

## 2022-11-10 DIAGNOSIS — I2510 Atherosclerotic heart disease of native coronary artery without angina pectoris: Secondary | ICD-10-CM

## 2022-11-10 DIAGNOSIS — R600 Localized edema: Secondary | ICD-10-CM | POA: Diagnosis not present

## 2022-11-10 DIAGNOSIS — I5181 Takotsubo syndrome: Secondary | ICD-10-CM

## 2022-11-10 DIAGNOSIS — M791 Myalgia, unspecified site: Secondary | ICD-10-CM

## 2022-11-10 MED ORDER — AMLODIPINE BESYLATE 5 MG PO TABS: 5.0000 mg | ORAL_TABLET | Freq: Every day | ORAL | 3 refills | Status: DC

## 2022-11-10 MED ORDER — AMLODIPINE BESYLATE 10 MG PO TABS: 5.0000 mg | ORAL_TABLET | Freq: Every day | ORAL | 3 refills | Status: DC

## 2022-11-10 MED ORDER — FUROSEMIDE 20 MG PO TABS: ORAL_TABLET | ORAL | 3 refills | Status: DC

## 2022-11-10 MED ORDER — LOSARTAN POTASSIUM 50 MG PO TABS: 50.0000 mg | ORAL_TABLET | Freq: Every day | ORAL | 3 refills | Status: DC

## 2022-11-10 NOTE — Patient Instructions (Addendum)
Medication Instructions:  Restart Losartan 50 mg daily Furosemide (Lasix) 20 mg. Take 2 tab for 3 days, then take 20 mg daily. Decrease Amlodipine 5 mg daily  *If you need a refill on your cardiac medications before your next appointment, please call your pharmacy*   Lab Work: BMET today & 1 week  Testing/Procedures: Your physician has requested that you have an echocardiogram. Echocardiography is a painless test that uses sound waves to create images of your heart. It provides your doctor with information about the size and shape of your heart and how well your heart's chambers and valves are working. This procedure takes approximately one hour. There are no restrictions for this procedure. Please do NOT wear cologne, perfume, aftershave, or lotions (deodorant is allowed). Please arrive 15 minutes prior to your appointment time.     Follow-Up: At [facility], you and your health needs are our priority.  As part of our continuing mission to provide you with exceptional heart care, we have created designated Provider Care Teams.  These Care Teams include your primary Cardiologist (physician) and Advanced Practice Providers (APPs -  Physician Assistants and Nurse Practitioners) who all work together to provide you with the care you need, when you need it.  We recommend signing up for the patient portal called "MyChart".  Sign up information is provided on this After Visit Summary.  MyChart is used to connect with patients for Virtual Visits (Telemedicine).  Patients are able to view lab/test results, encounter notes, upcoming appointments, etc.  Non-urgent messages can be sent to your provider as well.   To learn more about what you can do with MyChart, go to [email or web address].    Your next appointment:    November 26, 2022  Provider:   Carlos Levering, NP        Other Instructions

## 2022-11-11 LAB — BASIC METABOLIC PANEL
BUN/Creatinine Ratio: 19 (ref 12–28)
BUN: 21 mg/dL (ref 8–27)
CO2: 25 mmol/L (ref 20–29)
Calcium: 10 mg/dL (ref 8.7–10.3)
Chloride: 97 mmol/L (ref 96–106)
Creatinine, Ser: 1.1 mg/dL — ABNORMAL HIGH (ref 0.57–1.00)
Glucose: 97 mg/dL (ref 70–99)
Potassium: 3.9 mmol/L (ref 3.5–5.2)
Sodium: 138 mmol/L (ref 134–144)
eGFR: 50 mL/min/{1.73_m2} — ABNORMAL LOW (ref >59)

## 2022-11-12 ENCOUNTER — Ambulatory Visit (HOSPITAL_BASED_OUTPATIENT_CLINIC_OR_DEPARTMENT_OTHER)
Admission: RE | Admit: 2022-11-12 | Discharge: 2022-11-12 | Disposition: A | Discharge status: Home / Self Care | Payer: Medicare Other | Source: Ambulatory Visit | Attending: Student | Admitting: Student

## 2022-11-12 DIAGNOSIS — R0609 Other forms of dyspnea: Secondary | ICD-10-CM | POA: Diagnosis present

## 2022-11-12 LAB — ECHOCARDIOGRAM COMPLETE
Area-P 1/2: 2.39 cm2
MV M vel: 3.47 m/s
MV Peak grad: 48.2 mmHg
S' Lateral: 2.6 cm

## 2022-11-13 ENCOUNTER — Other Ambulatory Visit: Payer: Self-pay | Admitting: Allergy

## 2022-11-13 DIAGNOSIS — T7840XD Allergy, unspecified, subsequent encounter: Secondary | ICD-10-CM

## 2022-11-13 NOTE — Addendum Note (Signed)
Addended by: Elsworth Soho on: 11/13/2022 09:21 AM   Modules accepted: Orders

## 2022-11-17 LAB — BASIC METABOLIC PANEL
BUN/Creatinine Ratio: 27 (ref 12–28)
BUN: 35 mg/dL — ABNORMAL HIGH (ref 8–27)
CO2: 26 mmol/L (ref 20–29)
Calcium: 9.6 mg/dL (ref 8.7–10.3)
Chloride: 96 mmol/L (ref 96–106)
Creatinine, Ser: 1.29 mg/dL — ABNORMAL HIGH (ref 0.57–1.00)
Glucose: 95 mg/dL (ref 70–99)
Potassium: 3.8 mmol/L (ref 3.5–5.2)
Sodium: 138 mmol/L (ref 134–144)
eGFR: 41 mL/min/{1.73_m2} — ABNORMAL LOW (ref >59)

## 2022-11-23 NOTE — Progress Notes (Signed)
Cardiology Clinic Note   Date: 11/26/2022 ID: JEQUETTA ABERG, DOB [birth year], MRN [id number]  Primary Cardiologist:  Nicki Guadalajara, MD  Patient Profile    Angela Preston is a 82 y.o. female who presents to the clinic today for follow up.     Past medical history significant for: Nonobstructive CAD. LHC 05/02/2015: Mild nonobstructive CAD.  Severe segmental LV systolic dysfunction and pattern suggestive of stress-induced cardiomyopathy.  LV function recovered per echo 08/27/2015. Stress-induced cardiomyopathy. Echo 11/12/2022: EF 55-60%.  Grade I DD.  Normal RV function.  No significant valvular abnormalities. Hypertension. Hyperlipidemia. Lipid panel 10/20/2022: LDL 121, HDL 56, TG 170, total 207. Interstitial lung disease. GERD. DVT. Venous ultrasound 05/26/2018: Right: No DVT in lower extremity.  Left: Acute DVT involving the left common femoral vein, left femoral vein, left proximal profunda vein, left popliteal vein, left posterior tibial vein, and left peroneal vein.  Acute superficial vein thrombosis involving the left great saphenous vein.  Thrombosis external iliac vein.  Left common iliac vein patent.      History of Present Illness    Angela Preston is a longtime patient of cardiology.  She was previously followed by Dr. Alanda Amass.  She transitioned her care to Dr. Tresa Endo on 04/22/2013.  She has a remote history of 2 prior catheterizations >30 years ago in which she was told her coronaries were normal.  She continues to be followed for the above outlined history.   Patient was last seen in the office by me on 11/10/2022 with complains of lower extremity edema since December. She reported waking with normal lower extremities that became edematous within 15 minutes of being up and progressing throughout the day. She tried compression stockings but that caused edema above the knees. Periodic elevation did not help. She felt she became more dyspneic related to the edema. Her weight was  up approximately 12 lb. Her PCP had recently stopped losartan and increased amlodipine. She felt edema worsened since increasing amlodipine. She was hypertensive at the time of her visit.  Patient was instructed to restart losartan and decrease amlodipine.  She was started on Lasix 40 mg x 3 days then 20 mg thereafter.  Echo showed normal LV/RV function, Grade I DD.  BMP showed slightly elevated creatinine.  Patient was instructed to change daily Lasix to as needed for weight gain of 3 pounds overnight and 5 pounds in a week.  Today, patient reports lower extremity edema has resolved. Home weight is stable. Home BP ~120/60. Patient reports dyspnea with heavier exertion is at baseline. She has a long flight of stairs in her home and is able to go up and down without difficulty. She has not needed Lasix since 7/4.    ROS: All other systems reviewed and are otherwise negative except as noted in History of Present Illness.  Studies Reviewed      EKG is not ordered today.       Physical Exam    VS:  BP 128/60 (BP Location: Left Arm, Patient Position: Sitting, Cuff Size: Normal)   Pulse 60   Ht 5' (1.524 m)   Wt 148 lb 3.2 oz (67.2 kg)   SpO2 95%   BMI 28.94 kg/m  , BMI Body mass index is 28.94 kg/m.  GEN: Well nourished, well developed, in no acute distress. Neck: No JVD or carotid bruits. Cardiac:  RRR. No murmurs. No rubs or gallops.   Respiratory:  Respirations regular and unlabored. Clear to auscultation without rales, wheezing  or rhonchi. GI: Soft, nontender, nondistended. Extremities: Radials/DP/PT 2+ and equal bilaterally. No clubbing or cyanosis. No edema.  Skin: Warm and dry, no rash. Neuro: Strength intact.  Assessment & Plan     Lower extremity edema.  Patient reports resolution of lower extremity edema. She has not needed Lasix since 7/4. Home weight is stable. DOE is at baseline for her since edema resolved. No orthopnea or PND. Continue daily weights and prn Lasix.   Hypertension: BP today 128/60.  Home BP~120/60 consistently.  Patient denies headaches, dizziness or vision changes.  Continue amlodipine, metoprolol and Losartan.  Repeat BMP today.  Nonobstructive CAD.  Remote history of cardiac catheterizations x 2 >30 years ago and told she had normal coronaries.  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 Patient denies chest pain, tightness, or pressure.  Continue metoprolol, Vascepa, Zetia. Takotsubo cardiomyopathy.  LHC December 2016 showed severe LV dysfunction consistent with stress-induced cardiomyopathy.  Recovered LV function per echo April 2017.  Echo June 2024 showed normal LV/RV function, Grade I DD.  Disposition: BMP today. Return in 3-4 months or sooner as needed.          Signed, Etta Grandchild. Scherry Laverne, DNP, NP-C

## 2022-11-24 ENCOUNTER — Ambulatory Visit (INDEPENDENT_AMBULATORY_CARE_PROVIDER_SITE_OTHER): Payer: Medicare Other

## 2022-11-24 DIAGNOSIS — E538 Deficiency of other specified B group vitamins: Secondary | ICD-10-CM | POA: Diagnosis not present

## 2022-11-24 NOTE — Progress Notes (Signed)
Pt here for monthly B12 injection per Dr Kuneff ? ?B12 1000mcg given IM, and pt tolerated injection well. ? ?Next B12 injection scheduled for 2 weeks ? ?

## 2022-11-26 ENCOUNTER — Encounter: Payer: Self-pay | Admitting: Student

## 2022-11-26 ENCOUNTER — Ambulatory Visit: Payer: Medicare Other | Attending: Student | Admitting: Student

## 2022-11-26 VITALS — BP 128/60 | HR 60 | Ht 60.0 in | Wt 148.2 lb

## 2022-11-26 DIAGNOSIS — I2510 Atherosclerotic heart disease of native coronary artery without angina pectoris: Secondary | ICD-10-CM

## 2022-11-26 DIAGNOSIS — R600 Localized edema: Secondary | ICD-10-CM | POA: Diagnosis not present

## 2022-11-26 DIAGNOSIS — Z79899 Other long term (current) drug therapy: Secondary | ICD-10-CM | POA: Diagnosis not present

## 2022-11-26 DIAGNOSIS — I10 Essential (primary) hypertension: Secondary | ICD-10-CM

## 2022-11-26 DIAGNOSIS — I5181 Takotsubo syndrome: Secondary | ICD-10-CM

## 2022-11-26 NOTE — Addendum Note (Signed)
Addended by: Carlos Levering on: 11/26/2022 01:51 PM   Modules accepted: Level of Service

## 2022-11-26 NOTE — Patient Instructions (Addendum)
Medication Instructions:  Your physician recommends that you continue on your current medications as directed. Please refer to the Current Medication list given to you today.  *If you need a refill on your cardiac medications before your next appointment, please call your pharmacy*   Lab Work: Labs will be drawn today If you have labs (blood work) drawn today and your tests are completely normal, you will receive your results only by: MyChart Message (if you have MyChart) OR A paper copy in the mail If you have any lab test that is abnormal or we need to change your treatment, we will call you to review the results.   Testing/Procedures: none   Follow-Up: At [facility], you and your health needs are our priority.  As part of our continuing mission to provide you with exceptional heart care, we have created designated Provider Care Teams.  These Care Teams include your primary Cardiologist (physician) and Advanced Practice Providers (APPs -  Physician Assistants and Nurse Practitioners) who all work together to provide you with the care you need, when you need it.  We recommend signing up for the patient portal called "MyChart".  Sign up information is provided on this After Visit Summary.  MyChart is used to connect with patients for Virtual Visits (Telemedicine).  Patients are able to view lab/test results, encounter notes, upcoming appointments, etc.  Non-urgent messages can be sent to your provider as well.   To learn more about what you can do with MyChart, go to [email or web address].    Your next appointment:   4 month(s)  Provider:   Carlos Levering, NP

## 2022-11-27 LAB — BASIC METABOLIC PANEL
BUN/Creatinine Ratio: 19 (ref 12–28)
BUN: 22 mg/dL (ref 8–27)
CO2: 25 mmol/L (ref 20–29)
Calcium: 9.7 mg/dL (ref 8.7–10.3)
Chloride: 100 mmol/L (ref 96–106)
Creatinine, Ser: 1.18 mg/dL — ABNORMAL HIGH (ref 0.57–1.00)
Glucose: 96 mg/dL (ref 70–99)
Potassium: 4.2 mmol/L (ref 3.5–5.2)
Sodium: 140 mmol/L (ref 134–144)
eGFR: 46 mL/min/{1.73_m2} — ABNORMAL LOW (ref >59)

## 2022-12-01 ENCOUNTER — Other Ambulatory Visit: Payer: Self-pay | Admitting: Cardiovascular Disease

## 2022-12-11 ENCOUNTER — Ambulatory Visit: Payer: Medicare Other

## 2022-12-11 ENCOUNTER — Ambulatory Visit: Payer: Medicare Other | Admitting: Allergy

## 2022-12-11 ENCOUNTER — Ambulatory Visit (INDEPENDENT_AMBULATORY_CARE_PROVIDER_SITE_OTHER): Payer: Medicare Other

## 2022-12-11 DIAGNOSIS — E538 Deficiency of other specified B group vitamins: Secondary | ICD-10-CM | POA: Diagnosis not present

## 2022-12-11 NOTE — Progress Notes (Signed)
Pt here for monthly B12 injection per Dr Kuneff ? ?B12 1000mcg given IM, and pt tolerated injection well. ? ?Next B12 injection scheduled for 2 weeks ? ?

## 2022-12-17 NOTE — Progress Notes (Unsigned)
Follow Up Note  RE: Angela Preston MRN: [id number] DOB: Nov 22, 1940 Date of Office Visit: 12/18/2022  Referring provider: Natalia Leatherwood, DO Primary care provider: Natalia Leatherwood, DO  Chief Complaint: No chief complaint on file.  History of Present Illness: I had the pleasure of seeing Angela Preston for a follow up visit at the [facility] on 12/17/2022. She is a 82 y.o. female, who is being followed for angioedema and pruritus. Her previous allergy office visit was on 10/07/2022 with Dr. Selena Batten. Today is a regular follow up visit.  Did she get new bw done?  Autoimmune screener, inflammation markers, chronic urticaria index (checks for autoantibodies that trigger mast cells), stinging insect panel, angioedema (checks for swelling issues) panel, urine test were all normal which is great.   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 No pork, beef, lamb.   Tryptase (checks for mast cell issues and can be high when having an allergic reaction) was elevated. I need for her to get this bloodwork rechecked before the next visit. Please have patient either come into our office to get labwork drawn or mail the lab order.   Make sure she keeps her July appointment.   Anymore swelling episodes?  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 Initial episode was the worst requiring ER visit, IV solumedrol & Benadryl - resolved after 2 days completely. Denies changes in meds, personal care products. Had a new type of hot chip but subsequent episodes didn't eat this new chip. No family history of angioedema. No personal history of cancer. Not on Ace-inhibitors. Now noticing some itching/burning sensation of the skin but no hives/rash. 2024 bloodwork: CBC diff, TSH, CP, C3, C4, c1 esterase inhibitor - unremarkable.  Unclear etiology but doesn't seem to be related to foods or environmental allergies. No indication for  any skin testing.  Keep track of episodes and take pictures. Write down what you had done or eaten that day. Start zyrtec (cetirizine) 10mg  once a day. If symptoms are not controlled or causes drowsiness let us know. Avoid the following potential triggers: alcohol, tight clothing, NSAIDs, hot showers and getting overheated. See below for proper skin care.    Angioedema may be isolated or associated with urticaria. It may be histamine-induced or bradykinin-mediated. Patients with histamine-induced angioedema are more likely to have associated symptoms of urticaria, pruritus or signs of anaphylaxis. Patients with bradykinin-mediated do not have any symptoms of pruritus or urticaria and they did not improve with Benadryl or Epinephrine.  Avoid ACE-inhibitors. I have prescribed epinephrine injectable device and demonstrated proper use. For mild symptoms you can take over the counter antihistamines such as Benadryl 1-2 tablets = 25-50mg  and monitor symptoms closely. If symptoms worsen or if you have severe symptoms including breathing issues, throat closure, significant swelling, whole body hives, severe diarrhea and vomiting, lightheadedness then inject epinephrine and seek immediate medical care afterwards. Emergency action plan given. Get bloodwork to rule out other etiologies.    Pruritus See assessment and plan as above.   Return in about 2 months (around 12/07/2022).    Assessment and Plan: Angela Preston is a 82 y.o. female with: No problem-specific Assessment & Plan notes found for this encounter.  No follow-ups on file.  No orders of the defined types were placed in this encounter.  Lab Orders  No laboratory test(s) ordered today    Diagnostics: Spirometry:  Tracings reviewed. Her effort: {  Blank single:19197::"Good reproducible efforts.","It was hard to get consistent efforts and there is a question as to whether this reflects a maximal maneuver.","Poor effort, data can not be  interpreted."} FVC: ***L FEV1: ***L, ***% predicted FEV1/FVC ratio: ***% Interpretation: {Blank single:19197::"Spirometry consistent with mild obstructive disease","Spirometry consistent with moderate obstructive disease","Spirometry consistent with severe obstructive disease","Spirometry consistent with possible restrictive disease","Spirometry consistent with mixed obstructive and restrictive disease","Spirometry uninterpretable due to technique","Spirometry consistent with normal pattern","No overt abnormalities noted given today's efforts"}.  Please see scanned spirometry results for details.  Skin Testing: {Blank single:19197::"Select foods","Environmental allergy panel","Environmental allergy panel and select foods","Food allergy panel","None","Deferred due to recent antihistamines use"}. *** Results discussed with patient/family.   Medication List:  Current Outpatient Medications  Medication Sig Dispense Refill   amLODipine (NORVASC) 5 MG tablet Take 1 tablet (5 mg total) by mouth daily. 90 tablet 3   carboxymethylcellulose (REFRESH PLUS) 0.5 % SOLN Place 1 drop into both eyes daily.     cetirizine (ZYRTEC ALLERGY) 10 MG tablet Take 1 tablet (10 mg total) by mouth at bedtime. 30 tablet 2   EPINEPHrine 0.3 mg/0.3 mL IJ SOAJ injection Inject 0.3 mg into the muscle as needed for anaphylaxis. 2 each 1   ezetimibe (ZETIA) 10 MG tablet Take 1 tablet (10 mg total) by mouth daily. 90 tablet 3   furosemide (LASIX) 20 MG tablet Take 40 mg for 3 days then resume 20 mg daily. 90 tablet 3   icosapent Ethyl (VASCEPA) 1 g capsule TAKE 2 CAPSULES(2 GRAMS) BY MOUTH TWICE DAILY 360 capsule 3   losartan (COZAAR) 50 MG tablet Take 1 tablet (50 mg total) by mouth daily. 90 tablet 3   metoprolol succinate (TOPROL-XL) 25 MG 24 hr tablet TAKE 1/2 TABLET(12.5 MG) BY MOUTH DAILY WITH OR IMMEDIATELY FOLLOWING A MEAL 45 tablet 3   Misc Natural Products (LUTEIN 20 PO) Take 20 mg by mouth daily.     Multiple  Vitamins-Minerals (MULTIVITAMIN WITH MINERALS) tablet Take 1 tablet by mouth daily with lunch.      Omega-3 Fatty Acids (FISH OIL) 1000 MG CAPS Take 1,000 mg by mouth 2 (two) times daily.      triamterene-hydrochlorothiazide (MAXZIDE) 75-50 MG tablet TAKE 1 TABLET BY MOUTH DAILY 90 tablet 3   Current Facility-Administered Medications  Medication Dose Route Frequency Provider Last Rate Last Admin   cyanocobalamin (VITAMIN B12) injection 1,000 mcg  1,000 mcg Intramuscular Q30 days Kuneff, Renee A, DO   1,000 mcg at 12/11/22 1106   Allergies: Allergies  Allergen Reactions   Crestor [Rosuvastatin]     myalgias   Lipitor [Atorvastatin]     myalgias   Prolia [Denosumab] Other (See Comments)    Muscle cramps, leg weakness and tingling   Reclast [Zoledronic Acid] Other (See Comments)    Caused numbness in jaw and neck   Boniva [Ibandronic Acid] Palpitations   Statins Other (See Comments)    Myalgia   I reviewed her past medical history, social history, family history, and environmental history and no significant changes have been reported from her previous visit.  Review of Systems  Constitutional:  Negative for appetite change, chills, fever and unexpected weight change.  HENT:  Negative for congestion and rhinorrhea.   Eyes:  Negative for itching.  Respiratory:  Positive for cough and shortness of breath.   Cardiovascular:  Negative for chest pain.  Gastrointestinal:  Negative for abdominal pain.  Genitourinary:  Negative for difficulty urinating.  Skin:  Negative for rash.  Neurological:  Negative  for headaches.    Objective: There were no vitals taken for this visit. There is no height or weight on file to calculate BMI. Physical Exam Vitals and nursing note reviewed.  Constitutional:      Appearance: Normal appearance. She is well-developed.  HENT:     Head: Normocephalic and atraumatic.     Right Ear: Tympanic membrane and external ear normal.     Left Ear: Tympanic  membrane and external ear normal.     Nose: Nose normal.     Mouth/Throat:     Mouth: Mucous membranes are moist.     Pharynx: Oropharynx is clear.  Eyes:     Conjunctiva/sclera: Conjunctivae normal.  Cardiovascular:     Rate and Rhythm: Normal rate and regular rhythm.     Heart sounds: Normal heart sounds. No murmur heard.    No friction rub. No gallop.  Pulmonary:     Effort: Pulmonary effort is normal.     Breath sounds: Normal breath sounds. No wheezing, rhonchi or rales.  Musculoskeletal:     Cervical back: Neck supple.  Skin:    General: Skin is warm.     Findings: No rash.  Neurological:     Mental Status: She is alert and oriented to person, place, and time.  Psychiatric:        Behavior: Behavior normal.    Previous notes and tests were reviewed. The plan was reviewed with the patient/family, and all questions/concerned were addressed.  It was my pleasure to see Angela Preston today and participate in her care. Please feel free to contact me with any questions or concerns.  Sincerely,  Wyline Mood, DO Allergy & Immunology  [facility] office: [phone] [facility] office: [phone]

## 2022-12-18 ENCOUNTER — Telehealth: Payer: Self-pay

## 2022-12-18 ENCOUNTER — Ambulatory Visit: Payer: Medicare Other | Admitting: Allergy

## 2022-12-18 ENCOUNTER — Encounter: Payer: Self-pay | Admitting: Allergy

## 2022-12-18 VITALS — BP 122/70 | HR 65 | Temp 97.9°F | Resp 16 | Ht 61.1 in | Wt 152.0 lb

## 2022-12-18 DIAGNOSIS — R748 Abnormal levels of other serum enzymes: Secondary | ICD-10-CM | POA: Diagnosis not present

## 2022-12-18 DIAGNOSIS — L299 Pruritus, unspecified: Secondary | ICD-10-CM

## 2022-12-18 DIAGNOSIS — T7840XD Allergy, unspecified, subsequent encounter: Secondary | ICD-10-CM

## 2022-12-18 DIAGNOSIS — T783XXD Angioneurotic edema, subsequent encounter: Secondary | ICD-10-CM | POA: Diagnosis not present

## 2022-12-18 NOTE — Patient Instructions (Addendum)
Swelling episodes Your repeat tryptase level was elevated. Some of the other bloodwork did not come back yet. I'm referring you to heme/onc for further evaluation. May need a bone marrow biopsy.  Keep track of episodes and take pictures. Write down what you had done or eaten that day.  Continue to avoid mammalian meat for now.  Continue zyrtec (cetirizine) 10mg  once a day. If symptoms are not controlled or causes drowsiness let us know. Avoid the following potential triggers: alcohol, tight clothing, NSAIDs, hot showers and getting overheated.  For mild symptoms you can take over the counter antihistamines such as Benadryl and monitor symptoms closely. If symptoms worsen or if you have severe symptoms including breathing issues, throat closure, significant swelling, whole body hives, severe diarrhea and vomiting, lightheadedness then inject epinephrine and seek immediate medical care afterwards. Action plan in place.   Follow up in 3 months or sooner if needed.

## 2022-12-18 NOTE — Telephone Encounter (Signed)
Referral to Hematology for Elevated Tryptase

## 2022-12-19 NOTE — Progress Notes (Signed)
Please call patient and let her know that I got the rest of the bloodwork back. It was negative to tomato, onion and chili pepper.

## 2022-12-23 ENCOUNTER — Other Ambulatory Visit (HOSPITAL_BASED_OUTPATIENT_CLINIC_OR_DEPARTMENT_OTHER): Payer: Self-pay | Admitting: Family Medicine

## 2022-12-23 DIAGNOSIS — Z1231 Encounter for screening mammogram for malignant neoplasm of breast: Secondary | ICD-10-CM

## 2022-12-25 ENCOUNTER — Ambulatory Visit: Payer: Medicare Other

## 2022-12-26 ENCOUNTER — Ambulatory Visit: Payer: Medicare Other

## 2022-12-26 DIAGNOSIS — E538 Deficiency of other specified B group vitamins: Secondary | ICD-10-CM | POA: Diagnosis not present

## 2022-12-26 NOTE — Progress Notes (Signed)
Pt here for biweekly B12 injection per Dr. Claiborne Billings  B12 given IM, and pt tolerated injection well.  Next B12 injection scheduled for 2 weeks

## 2022-12-29 ENCOUNTER — Ambulatory Visit (HOSPITAL_BASED_OUTPATIENT_CLINIC_OR_DEPARTMENT_OTHER)
Admission: RE | Admit: 2022-12-29 | Discharge: 2022-12-29 | Disposition: A | Discharge status: Home / Self Care | Payer: Medicare Other | Source: Ambulatory Visit | Attending: Family Medicine | Admitting: Family Medicine

## 2022-12-29 DIAGNOSIS — Z1231 Encounter for screening mammogram for malignant neoplasm of breast: Secondary | ICD-10-CM | POA: Insufficient documentation

## 2022-12-29 NOTE — Telephone Encounter (Signed)
I called to inform Mrs. Angela Preston about her referral being placed. She states she is out and about at this time. She requested that I call her home phone and leave it on the Voicemail. I left her a detailed voicemail there.   [facility] [phone]  [address] [place], [place] [address]  Referral placed in EPIC.

## 2022-12-30 NOTE — Telephone Encounter (Signed)
Called their office and was informed they are still reviewing the patients referral. Their office will be in contact with [place] or the patient regarding the provider approving the referral or not.   Patient has been informed.

## 2022-12-30 NOTE — Telephone Encounter (Signed)
Patient called stating that she called the number to get scheduled and got a recording saying no one can take her call. Patient states she has called twice today and once yesteday and got the same recording.

## 2023-01-09 ENCOUNTER — Ambulatory Visit (INDEPENDENT_AMBULATORY_CARE_PROVIDER_SITE_OTHER): Payer: Medicare Other

## 2023-01-09 DIAGNOSIS — E538 Deficiency of other specified B group vitamins: Secondary | ICD-10-CM | POA: Diagnosis not present

## 2023-01-09 MED ORDER — CYANOCOBALAMIN 1000 MCG/ML IJ SOLN
1000.0000 ug | Freq: Once | INTRAMUSCULAR | Status: AC
Start: 2023-01-09 — End: 2023-01-09
  Administered 2023-01-09: 1000 ug via INTRAMUSCULAR

## 2023-01-09 NOTE — Progress Notes (Signed)
Pt here for biweekly B12 injection per Dr Claiborne Billings.   B12 given IM, and pt tolerated injection well.   Next B12 injection scheduled for 2 weeks.  Please sign in PCP absence.

## 2023-01-22 ENCOUNTER — Ambulatory Visit (INDEPENDENT_AMBULATORY_CARE_PROVIDER_SITE_OTHER): Payer: Medicare Other

## 2023-01-22 DIAGNOSIS — E538 Deficiency of other specified B group vitamins: Secondary | ICD-10-CM

## 2023-01-22 NOTE — Progress Notes (Signed)
Pt here for monthly B12 injection per Dr.Kuneff   B12 1000mcg given IM, and pt tolerated injection well.   Next B12 injection scheduled for 1 month.       

## 2023-01-30 ENCOUNTER — Other Ambulatory Visit: Payer: Self-pay | Admitting: Cardiovascular Disease

## 2023-02-05 ENCOUNTER — Ambulatory Visit (INDEPENDENT_AMBULATORY_CARE_PROVIDER_SITE_OTHER): Payer: Medicare Other

## 2023-02-05 DIAGNOSIS — E538 Deficiency of other specified B group vitamins: Secondary | ICD-10-CM

## 2023-02-05 NOTE — Progress Notes (Signed)
Pt here for biweekly B12 injection per Dr. Claiborne Billings  B12 given IM, and pt tolerated injection well.  Next B12 injection scheduled for 2 weeks

## 2023-02-19 ENCOUNTER — Ambulatory Visit: Payer: Medicare Other

## 2023-02-19 ENCOUNTER — Ambulatory Visit (INDEPENDENT_AMBULATORY_CARE_PROVIDER_SITE_OTHER): Payer: Medicare Other

## 2023-02-19 DIAGNOSIS — E538 Deficiency of other specified B group vitamins: Secondary | ICD-10-CM | POA: Diagnosis not present

## 2023-02-19 NOTE — Progress Notes (Signed)
Pt here for monthly B12 injection per Dr.Kuneff   B12 1000mcg given IM, and pt tolerated injection well.   Next B12 injection scheduled for 1 month.       

## 2023-02-20 ENCOUNTER — Other Ambulatory Visit: Payer: Self-pay | Admitting: Nurse Practitioner

## 2023-02-20 DIAGNOSIS — R748 Abnormal levels of other serum enzymes: Secondary | ICD-10-CM
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
## 2023-02-23 ENCOUNTER — Inpatient Hospital Stay: Payer: Medicare Other | Admitting: Nurse Practitioner

## 2023-02-23 ENCOUNTER — Encounter: Payer: Self-pay | Admitting: Nurse Practitioner

## 2023-02-23 ENCOUNTER — Inpatient Hospital Stay: Payer: Medicare Other | Attending: Oncology

## 2023-02-23 VITALS — BP 139/78 | HR 91 | Temp 97.7°F | Resp 18 | Ht 61.0 in | Wt 150.5 lb

## 2023-02-23 DIAGNOSIS — J4489 Other specified chronic obstructive pulmonary disease: Secondary | ICD-10-CM | POA: Diagnosis not present

## 2023-02-23 DIAGNOSIS — Z860101 Personal history of adenomatous and serrated colon polyps: Secondary | ICD-10-CM | POA: Diagnosis not present

## 2023-02-23 DIAGNOSIS — Z86718 Personal history of other venous thrombosis and embolism: Secondary | ICD-10-CM | POA: Insufficient documentation

## 2023-02-23 DIAGNOSIS — Z79899 Other long term (current) drug therapy: Secondary | ICD-10-CM | POA: Diagnosis not present

## 2023-02-23 DIAGNOSIS — T783XXA Angioneurotic edema, initial encounter: Secondary | ICD-10-CM | POA: Diagnosis not present

## 2023-02-23 DIAGNOSIS — I129 Hypertensive chronic kidney disease with stage 1 through stage 4 chronic kidney disease, or unspecified chronic kidney disease: Secondary | ICD-10-CM | POA: Insufficient documentation

## 2023-02-23 DIAGNOSIS — K219 Gastro-esophageal reflux disease without esophagitis: Secondary | ICD-10-CM | POA: Diagnosis not present

## 2023-02-23 DIAGNOSIS — E785 Hyperlipidemia, unspecified: Secondary | ICD-10-CM | POA: Insufficient documentation

## 2023-02-23 DIAGNOSIS — R748 Abnormal levels of other serum enzymes: Secondary | ICD-10-CM

## 2023-02-23 DIAGNOSIS — N183 Chronic kidney disease, stage 3 unspecified: Secondary | ICD-10-CM | POA: Insufficient documentation

## 2023-02-23 DIAGNOSIS — J849 Interstitial pulmonary disease, unspecified: Secondary | ICD-10-CM | POA: Diagnosis not present

## 2023-02-23 DIAGNOSIS — I252 Old myocardial infarction: Secondary | ICD-10-CM | POA: Insufficient documentation

## 2023-02-23 LAB — CBC WITH DIFFERENTIAL (CANCER CENTER ONLY)
Abs Immature Granulocytes: 0.02 10*3/uL (ref 0.00–0.07)
Basophils Absolute: 0.1 10*3/uL (ref 0.0–0.1)
Basophils Relative: 1 %
Eosinophils Absolute: 0.6 10*3/uL — ABNORMAL HIGH (ref 0.0–0.5)
Eosinophils Relative: 5 %
HCT: 38.2 % (ref 36.0–46.0)
Hemoglobin: 13 g/dL (ref 12.0–15.0)
Immature Granulocytes: 0 %
Lymphocytes Relative: 32 %
Lymphs Abs: 3.6 10*3/uL (ref 0.7–4.0)
MCH: 31.6 pg (ref 26.0–34.0)
MCHC: 34 g/dL (ref 30.0–36.0)
MCV: 92.7 fL (ref 80.0–100.0)
Monocytes Absolute: 0.8 10*3/uL (ref 0.1–1.0)
Monocytes Relative: 8 %
Neutro Abs: 6 10*3/uL (ref 1.7–7.7)
Neutrophils Relative %: 54 %
Platelet Count: 398 10*3/uL (ref 150–400)
RBC: 4.12 MIL/uL (ref 3.87–5.11)
RDW: 12.4 % (ref 11.5–15.5)
WBC Count: 11.1 10*3/uL — ABNORMAL HIGH (ref 4.0–10.5)
nRBC: 0 % (ref 0.0–0.2)

## 2023-02-23 LAB — SAVE SMEAR(SSMR), FOR PROVIDER SLIDE REVIEW

## 2023-02-24 ENCOUNTER — Telehealth: Payer: Self-pay

## 2023-02-24 ENCOUNTER — Other Ambulatory Visit: Payer: Self-pay | Admitting: Nurse Practitioner

## 2023-02-24 DIAGNOSIS — R748 Abnormal levels of other serum enzymes: Secondary | ICD-10-CM

## 2023-02-24 NOTE — Telephone Encounter (Signed)
Lab added the new request on.

## 2023-02-24 NOTE — Telephone Encounter (Signed)
-----   Message from Lonna Cobb sent at 02/24/2023  4:24 PM EDT ----- Please ask lab to add a myeloma panel to the blood work from 02/23/2023.

## 2023-02-25 ENCOUNTER — Encounter: Payer: Self-pay | Admitting: *Deleted

## 2023-02-25 ENCOUNTER — Other Ambulatory Visit: Payer: Self-pay | Admitting: Nurse Practitioner

## 2023-02-25 DIAGNOSIS — R748 Abnormal levels of other serum enzymes: Secondary | ICD-10-CM

## 2023-02-25 NOTE — Progress Notes (Signed)
Referral faxed to Dr Leotis Pain at [facility]  at fax number [phone]

## 2023-02-26 ENCOUNTER — Telehealth: Payer: Self-pay

## 2023-02-26 LAB — MULTIPLE MYELOMA PANEL, SERUM
Albumin SerPl Elph-Mcnc: 4.2 g/dL (ref 2.9–4.4)
Albumin/Glob SerPl: 1.3 (ref 0.7–1.7)
Alpha 1: 0.2 g/dL (ref 0.0–0.4)
Alpha2 Glob SerPl Elph-Mcnc: 0.8 g/dL (ref 0.4–1.0)
B-Globulin SerPl Elph-Mcnc: 1.2 g/dL (ref 0.7–1.3)
Gamma Glob SerPl Elph-Mcnc: 1.3 g/dL (ref 0.4–1.8)
Globulin, Total: 3.4 g/dL (ref 2.2–3.9)
IgA: 417 mg/dL (ref 64–422)
IgG (Immunoglobin G), Serum: 1276 mg/dL (ref 586–1602)
IgM (Immunoglobulin M), Srm: 137 mg/dL (ref 26–217)
Total Protein ELP: 7.6 g/dL (ref 6.0–8.5)

## 2023-02-26 NOTE — Telephone Encounter (Signed)
-----   Message from Lonna Cobb sent at 02/24/2023  5:02 PM EDT ----- That is her pulmonologist.  This would be a different specialist.  Let me know if she agrees. ----- Message ----- From: Dimitri Ped, LPN Sent: 29/09/6211   4:48 PM EDT To: Rana Snare, NP  She states she currently goes to Dr. Isaiah Serge ----- Message ----- From: Rana Snare, NP Sent: 02/24/2023   4:26 PM EDT To: [facility]  Please call her--Dr. 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  Let me know if she will agree and I will place the referral.  Thanks

## 2023-02-26 NOTE — Telephone Encounter (Signed)
The patient has agreed to proceed with care at [facility].

## 2023-03-02 ENCOUNTER — Other Ambulatory Visit: Payer: Medicare Other

## 2023-03-02 ENCOUNTER — Encounter: Payer: Medicare Other | Admitting: Nurse Practitioner

## 2023-03-05 ENCOUNTER — Ambulatory Visit: Payer: Medicare Other

## 2023-03-05 ENCOUNTER — Encounter: Payer: Self-pay | Admitting: Family Medicine

## 2023-03-05 ENCOUNTER — Ambulatory Visit: Payer: Medicare Other | Admitting: Family Medicine

## 2023-03-05 VITALS — BP 138/74 | HR 89 | Temp 97.8°F | Wt 152.2 lb

## 2023-03-05 DIAGNOSIS — E538 Deficiency of other specified B group vitamins: Secondary | ICD-10-CM

## 2023-03-05 DIAGNOSIS — N1832 Chronic kidney disease, stage 3b: Secondary | ICD-10-CM

## 2023-03-05 DIAGNOSIS — E559 Vitamin D deficiency, unspecified: Secondary | ICD-10-CM

## 2023-03-05 DIAGNOSIS — G720 Drug-induced myopathy: Secondary | ICD-10-CM | POA: Insufficient documentation

## 2023-03-05 DIAGNOSIS — T466X5A Adverse effect of antihyperlipidemic and antiarteriosclerotic drugs, initial encounter: Secondary | ICD-10-CM | POA: Insufficient documentation

## 2023-03-05 DIAGNOSIS — M810 Age-related osteoporosis without current pathological fracture: Secondary | ICD-10-CM | POA: Diagnosis not present

## 2023-03-05 LAB — BASIC METABOLIC PANEL
BUN: 29 mg/dL — ABNORMAL HIGH (ref 6–23)
CO2: 32 meq/L (ref 19–32)
Calcium: 9.9 mg/dL (ref 8.4–10.5)
Chloride: 98 meq/L (ref 96–112)
Creatinine, Ser: 1 mg/dL (ref 0.40–1.20)
GFR: 52.03 mL/min — ABNORMAL LOW (ref >60.00)
Glucose, Bld: 99 mg/dL (ref 70–99)
Potassium: 4 meq/L (ref 3.5–5.1)
Sodium: 139 meq/L (ref 135–145)

## 2023-03-05 LAB — VITAMIN D 25 HYDROXY (VIT D DEFICIENCY, FRACTURES): VITD: 15.99 ng/mL — ABNORMAL LOW (ref 30.00–100.00)

## 2023-03-05 LAB — VITAMIN B12: Vitamin B-12: 1409 pg/mL — ABNORMAL HIGH (ref 211–911)

## 2023-03-05 NOTE — Patient Instructions (Signed)
Return in about 24 weeks (around 08/20/2023) for cpe (20 min), Routine chronic condition follow-up.        Great to see you today.  I have refilled the medication(s) we provide.   If labs were collected or images ordered, we will inform you of  results once we have received them and reviewed. We will contact you either by echart message, or telephone call.  Please give ample time to the testing facility, and our office to run,  receive and review results. Please do not call inquiring of results, even if you can see them in your chart. We will contact you as soon as we are able. If it has been over 1 week since the test was completed, and you have not yet heard from [place], then please call us.    - echart message- for normal results that have been seen by the patient already.   - telephone call: abnormal results or if patient has not viewed results in their echart.  If a referral to a specialist was entered for you, please call us in 2 weeks if you have not heard from the specialist office to schedule.
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
## 2023-03-06 ENCOUNTER — Telehealth: Payer: Self-pay | Admitting: Family Medicine

## 2023-03-06 MED ORDER — VITAMIN D (ERGOCALCIFEROL) 1.25 MG (50000 UNIT) PO CAPS: 50000.0000 [IU] | ORAL_CAPSULE | ORAL | 0 refills | Status: DC

## 2023-03-06 NOTE — Telephone Encounter (Signed)
Please call patient: Her B12 is excellent, continue monthly injections. Her vitamin D is extremely low at 15.9-I have called in once weekly high-dose vitamin D for her to start for 12 weeks.  Normal ranges are at least above 30.  After 12 weeks she should start OTC vitamin D3 2000 units daily to maintain levels. her kidney function is still mildly decreased, but it has improved from prior collections.

## 2023-03-06 NOTE — Telephone Encounter (Signed)
Spoke with patient regarding results/recommendations.  

## 2023-03-19 ENCOUNTER — Ambulatory Visit: Payer: Medicare Other

## 2023-03-24 ENCOUNTER — Ambulatory Visit: Payer: Medicare Other | Admitting: Physician Assistant

## 2023-03-30 ENCOUNTER — Ambulatory Visit: Payer: Medicare Other | Admitting: Pulmonary Disease

## 2023-04-02 ENCOUNTER — Ambulatory Visit: Payer: Medicare Other

## 2023-04-02 DIAGNOSIS — E538 Deficiency of other specified B group vitamins: Secondary | ICD-10-CM | POA: Diagnosis not present

## 2023-04-02 NOTE — Progress Notes (Signed)
Pt here for monthly B12 injection per Dr Claiborne Billings  B12 given IM, and pt tolerated injection well.  Next B12 injection scheduled for 4 weeks.
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
## 2023-04-07 ENCOUNTER — Ambulatory Visit: Payer: Medicare Other | Admitting: Physician Assistant

## 2023-04-08 ENCOUNTER — Encounter: Payer: Self-pay | Admitting: Physician Assistant

## 2023-04-08 ENCOUNTER — Ambulatory Visit: Payer: Medicare Other | Attending: Physician Assistant | Admitting: Physician Assistant

## 2023-04-08 VITALS — BP 108/76 | HR 77 | Ht 61.0 in | Wt 150.6 lb

## 2023-04-08 DIAGNOSIS — I5181 Takotsubo syndrome: Secondary | ICD-10-CM | POA: Diagnosis not present

## 2023-04-08 DIAGNOSIS — I10 Essential (primary) hypertension: Secondary | ICD-10-CM

## 2023-04-08 DIAGNOSIS — E785 Hyperlipidemia, unspecified: Secondary | ICD-10-CM | POA: Diagnosis not present

## 2023-04-08 MED ORDER — NITROGLYCERIN 0.4 MG SL SUBL: 0.4000 mg | SUBLINGUAL_TABLET | SUBLINGUAL | 3 refills | Status: AC | PRN

## 2023-04-08 NOTE — Patient Instructions (Signed)
Medication Instructions:  Furosemide 20 mg daily as needed for swelling or weight gain. Nitroglycerin .4 mg/SL take 1 tablet as needed for chest pain.  *If you need a refill on your cardiac medications before your next appointment, please call your pharmacy*   Lab Work: NONE ordered at this time of appointment     Testing/Procedures: NONE ordered at this time of appointment     Follow-Up: At [facility], you and your health needs are our priority.  As part of our continuing mission to provide you with exceptional heart care, we have created designated Provider Care Teams.  These Care Teams include your primary Cardiologist (physician) and Advanced Practice Providers (APPs -  Physician Assistants and Nurse Practitioners) who all work together to provide you with the care you need, when you need it.  We recommend signing up for the patient portal called "MyChart".  Sign up information is provided on this After Visit Summary.  MyChart is used to connect with patients for Virtual Visits (Telemedicine).  Patients are able to view lab/test results, encounter notes, upcoming appointments, etc.  Non-urgent messages can be sent to your provider as well.   To learn more about what you can do with MyChart, go to [email or web address].    Your next appointment:   6 month(s)  Provider:   Thurmon Fair, MD     Other Instructions

## 2023-04-30 ENCOUNTER — Ambulatory Visit (INDEPENDENT_AMBULATORY_CARE_PROVIDER_SITE_OTHER): Payer: Medicare Other

## 2023-04-30 DIAGNOSIS — E538 Deficiency of other specified B group vitamins: Secondary | ICD-10-CM

## 2023-04-30 MED ORDER — CYANOCOBALAMIN 1000 MCG/ML IJ SOLN
1000.0000 ug | Freq: Once | INTRAMUSCULAR | Status: AC
Start: 2023-04-30 — End: 2023-04-30
  Administered 2023-04-30: 1000 ug via INTRAMUSCULAR

## 2023-04-30 NOTE — Progress Notes (Signed)
Pt here for monthly B12 injection per Dr Claiborne Billings  B12 given IM, and pt tolerated injection well.  Next B12 injection scheduled for 4 weeks.

## 2023-06-02 ENCOUNTER — Ambulatory Visit: Payer: Medicare Other | Admitting: Pulmonary Disease

## 2023-06-02 ENCOUNTER — Other Ambulatory Visit: Payer: Self-pay | Admitting: Cardiovascular Disease

## 2023-07-30 ENCOUNTER — Other Ambulatory Visit: Payer: Self-pay | Admitting: Cardiovascular Disease

## 2023-08-10 ENCOUNTER — Ambulatory Visit: Payer: Medicare Other | Admitting: Pulmonary Disease

## 2023-08-26 ENCOUNTER — Encounter: Payer: Self-pay | Admitting: Family Medicine

## 2023-08-26 ENCOUNTER — Ambulatory Visit: Admitting: Family Medicine

## 2023-08-26 VITALS — BP 126/76 | HR 72 | Temp 98.1°F | Wt 155.0 lb

## 2023-08-26 DIAGNOSIS — I10 Essential (primary) hypertension: Secondary | ICD-10-CM | POA: Diagnosis not present

## 2023-08-26 DIAGNOSIS — I700 Atherosclerosis of aorta: Secondary | ICD-10-CM

## 2023-08-26 DIAGNOSIS — E538 Deficiency of other specified B group vitamins: Secondary | ICD-10-CM

## 2023-08-26 DIAGNOSIS — E785 Hyperlipidemia, unspecified: Secondary | ICD-10-CM

## 2023-08-26 DIAGNOSIS — M810 Age-related osteoporosis without current pathological fracture: Secondary | ICD-10-CM

## 2023-08-26 DIAGNOSIS — I2510 Atherosclerotic heart disease of native coronary artery without angina pectoris: Secondary | ICD-10-CM

## 2023-08-26 DIAGNOSIS — J849 Interstitial pulmonary disease, unspecified: Secondary | ICD-10-CM

## 2023-08-26 DIAGNOSIS — N1832 Chronic kidney disease, stage 3b: Secondary | ICD-10-CM

## 2023-08-26 DIAGNOSIS — E559 Vitamin D deficiency, unspecified: Secondary | ICD-10-CM

## 2023-08-26 LAB — BASIC METABOLIC PANEL WITH GFR
BUN: 29 mg/dL — ABNORMAL HIGH (ref 6–23)
CO2: 29 meq/L (ref 19–32)
Calcium: 9.8 mg/dL (ref 8.4–10.5)
Chloride: 102 meq/L (ref 96–112)
Creatinine, Ser: 1.12 mg/dL (ref 0.40–1.20)
GFR: 45.26 mL/min — ABNORMAL LOW (ref >60.00)
Glucose, Bld: 101 mg/dL — ABNORMAL HIGH (ref 70–99)
Potassium: 4.5 meq/L (ref 3.5–5.1)
Sodium: 139 meq/L (ref 135–145)

## 2023-08-26 LAB — LDL CHOLESTEROL, DIRECT: Direct LDL: 102 mg/dL

## 2023-08-26 LAB — CBC WITH DIFFERENTIAL/PLATELET
Basophils Absolute: 0.1 10*3/uL (ref 0.0–0.1)
Basophils Relative: 1.3 % (ref 0.0–3.0)
Eosinophils Absolute: 0.7 10*3/uL (ref 0.0–0.7)
Eosinophils Relative: 9 % — ABNORMAL HIGH (ref 0.0–5.0)
HCT: 40.1 % (ref 36.0–46.0)
Hemoglobin: 13.4 g/dL (ref 12.0–15.0)
Lymphocytes Relative: 39.7 % (ref 12.0–46.0)
Lymphs Abs: 3.1 10*3/uL (ref 0.7–4.0)
MCHC: 33.3 g/dL (ref 30.0–36.0)
MCV: 94.3 fl (ref 78.0–100.0)
Monocytes Absolute: 0.8 10*3/uL (ref 0.1–1.0)
Monocytes Relative: 10 % (ref 3.0–12.0)
Neutro Abs: 3.1 10*3/uL (ref 1.4–7.7)
Neutrophils Relative %: 40 % — ABNORMAL LOW (ref 43.0–77.0)
Platelets: 322 10*3/uL (ref 150.0–400.0)
RBC: 4.25 Mil/uL (ref 3.87–5.11)
RDW: 13.4 % (ref 11.5–15.5)
WBC: 7.8 10*3/uL (ref 4.0–10.5)

## 2023-08-26 LAB — TSH: TSH: 3.07 u[IU]/mL (ref 0.35–5.50)

## 2023-08-26 LAB — HEMOGLOBIN A1C: Hgb A1c MFr Bld: 5.8 % (ref 4.6–6.5)

## 2023-08-26 LAB — VITAMIN D 25 HYDROXY (VIT D DEFICIENCY, FRACTURES): VITD: 17.64 ng/mL — ABNORMAL LOW (ref 30.00–100.00)

## 2023-08-26 MED ORDER — CYANOCOBALAMIN 1000 MCG/ML IJ SOLN
1000.0000 ug | Freq: Once | INTRAMUSCULAR | Status: AC
  Administered 2023-08-26: 1000 ug via INTRAMUSCULAR

## 2023-08-26 NOTE — Patient Instructions (Signed)
 2 week nurse visit for b12 for 3 more injections, then once every 4 weeks.  Return in about 24 weeks (around 02/10/2024) for Routine chronic condition follow-up.        Great to see you today.  I have refilled the medication(s) we provide.   If labs were collected or images ordered, we will inform you of  results once we have received them and reviewed. We will contact you either by echart message, or telephone call.  Please give ample time to the testing facility, and our office to run,  receive and review results. Please do not call inquiring of results, even if you can see them in your chart. We will contact you as soon as we are able. If it has been over 1 week since the test was completed, and you have not yet heard from [place], then please call us.    - echart message- for normal results that have been seen by the patient already.   - telephone call: abnormal results or if patient has not viewed results in their echart.  If a referral to a specialist was entered for you, please call us in 2 weeks if you have not heard from the specialist office to schedule.
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
## 2023-08-27 ENCOUNTER — Encounter: Payer: Self-pay | Admitting: Family Medicine

## 2023-08-27 ENCOUNTER — Other Ambulatory Visit: Payer: Self-pay | Admitting: Family Medicine

## 2023-08-27 MED ORDER — VITAMIN D (ERGOCALCIFEROL) 1.25 MG (50000 UNIT) PO CAPS: 50000.0000 [IU] | ORAL_CAPSULE | ORAL | 3 refills | Status: DC

## 2023-09-07 ENCOUNTER — Other Ambulatory Visit (HOSPITAL_BASED_OUTPATIENT_CLINIC_OR_DEPARTMENT_OTHER): Payer: Self-pay

## 2023-09-07 DIAGNOSIS — J849 Interstitial pulmonary disease, unspecified: Secondary | ICD-10-CM

## 2023-09-09 ENCOUNTER — Ambulatory Visit

## 2023-09-09 ENCOUNTER — Encounter: Payer: Self-pay | Admitting: Pulmonary Disease

## 2023-09-09 ENCOUNTER — Ambulatory Visit: Admitting: Pulmonary Disease

## 2023-09-09 ENCOUNTER — Ambulatory Visit (HOSPITAL_BASED_OUTPATIENT_CLINIC_OR_DEPARTMENT_OTHER): Admitting: Pulmonary Disease

## 2023-09-09 VITALS — BP 122/68 | HR 69 | Temp 97.9°F | Ht 61.0 in | Wt 150.0 lb

## 2023-09-09 DIAGNOSIS — J849 Interstitial pulmonary disease, unspecified: Secondary | ICD-10-CM | POA: Diagnosis not present

## 2023-09-09 DIAGNOSIS — R0600 Dyspnea, unspecified: Secondary | ICD-10-CM

## 2023-09-09 DIAGNOSIS — Z5181 Encounter for therapeutic drug level monitoring: Secondary | ICD-10-CM

## 2023-09-09 LAB — PULMONARY FUNCTION TEST
DL/VA % pred: 119 %
DL/VA: 4.95 ml/min/mmHg/L
DLCO cor % pred: 66 %
DLCO cor: 11.05 ml/min/mmHg
DLCO unc % pred: 66 %
DLCO unc: 11.05 ml/min/mmHg
FEF 25-75 Post: 3.51 L/s
FEF 25-75 Pre: 3.06 L/s
FEF2575-%Change-Post: 14 %
FEF2575-%Pred-Post: 339 %
FEF2575-%Pred-Pre: 296 %
FEV1-%Change-Post: 4 %
FEV1-%Pred-Post: 96 %
FEV1-%Pred-Pre: 92 %
FEV1-Post: 1.47 L
FEV1-Pre: 1.41 L
FEV1FVC-%Change-Post: 2 %
FEV1FVC-%Pred-Pre: 127 %
FEV6-%Change-Post: 1 %
FEV6-%Pred-Post: 79 %
FEV6-%Pred-Pre: 78 %
FEV6-Post: 1.54 L
FEV6-Pre: 1.52 L
FEV6FVC-%Pred-Post: 106 %
FEV6FVC-%Pred-Pre: 106 %
FVC-%Change-Post: 1 %
FVC-%Pred-Post: 74 %
FVC-%Pred-Pre: 73 %
FVC-Post: 1.54 L
FVC-Pre: 1.52 L
Post FEV1/FVC ratio: 96 %
Post FEV6/FVC ratio: 100 %
Pre FEV1/FVC ratio: 93 %
Pre FEV6/FVC Ratio: 100 %
RV % pred: 47 %
RV: 1.11 L
TLC % pred: 67 %
TLC: 3.1 L

## 2023-09-09 LAB — COMPREHENSIVE METABOLIC PANEL WITH GFR
ALT: 20 U/L (ref 0–35)
AST: 27 U/L (ref 0–37)
Albumin: 4.8 g/dL (ref 3.5–5.2)
Alkaline Phosphatase: 71 U/L (ref 39–117)
BUN: 32 mg/dL — ABNORMAL HIGH (ref 6–23)
CO2: 29 meq/L (ref 19–32)
Calcium: 10 mg/dL (ref 8.4–10.5)
Chloride: 99 meq/L (ref 96–112)
Creatinine, Ser: 1.18 mg/dL (ref 0.40–1.20)
GFR: 42.5 mL/min — ABNORMAL LOW (ref >60.00)
Glucose, Bld: 112 mg/dL — ABNORMAL HIGH (ref 70–99)
Potassium: 3.8 meq/L (ref 3.5–5.1)
Sodium: 137 meq/L (ref 135–145)
Total Bilirubin: 0.5 mg/dL (ref 0.2–1.2)
Total Protein: 8.2 g/dL (ref 6.0–8.3)

## 2023-09-09 LAB — CBC WITH DIFFERENTIAL/PLATELET
Basophils Absolute: 0.1 10*3/uL (ref 0.0–0.1)
Basophils Relative: 0.9 % (ref 0.0–3.0)
Eosinophils Absolute: 0.5 10*3/uL (ref 0.0–0.7)
Eosinophils Relative: 6.2 % — ABNORMAL HIGH (ref 0.0–5.0)
HCT: 41.2 % (ref 36.0–46.0)
Hemoglobin: 13.8 g/dL (ref 12.0–15.0)
Lymphocytes Relative: 34.8 % (ref 12.0–46.0)
Lymphs Abs: 2.9 10*3/uL (ref 0.7–4.0)
MCHC: 33.4 g/dL (ref 30.0–36.0)
MCV: 93.6 fl (ref 78.0–100.0)
Monocytes Absolute: 0.7 10*3/uL (ref 0.1–1.0)
Monocytes Relative: 8.7 % (ref 3.0–12.0)
Neutro Abs: 4.2 10*3/uL (ref 1.4–7.7)
Neutrophils Relative %: 49.4 % (ref 43.0–77.0)
Platelets: 342 10*3/uL (ref 150.0–400.0)
RBC: 4.4 Mil/uL (ref 3.87–5.11)
RDW: 13 % (ref 11.5–15.5)
WBC: 8.5 10*3/uL (ref 4.0–10.5)

## 2023-09-09 LAB — BRAIN NATRIURETIC PEPTIDE: Pro B Natriuretic peptide (BNP): 42 pg/mL (ref 0.0–100.0)

## 2023-09-09 NOTE — Patient Instructions (Signed)
 VISIT SUMMARY:  Today, we discussed your worsening shortness of breath and reviewed your history of idiopathic pulmonary fibrosis (IPF). We talked about your recent episodes of nocturnal dyspnea and how your condition has progressed based on recent tests.  YOUR PLAN:  -IDIOPATHIC PULMONARY FIBROSIS (IPF): IPF is a lung disease that causes scarring of the lung tissue, leading to breathing difficulties. Your recent tests show a decline in lung function, although the scarring has not worsened. We discussed starting a medication to slow the progression of the disease, which may have side effects like nausea and diarrhea. We will monitor your liver function and any side effects from the medication. A chest CT scan will be done to assess your current lung condition. We will follow up in three months to see how you are doing.  INSTRUCTIONS:  Please complete the liver function tests and chest CT scan as soon as possible. Start the prescribed medication and monitor for any side effects. If you experience any severe side effects, contact our office immediately. We will see you again in three months for a follow-up appointment.

## 2023-09-09 NOTE — Progress Notes (Addendum)
 Angela Preston    [id number]    01-06-1941  Primary Care Physician:Kuneff, Malissa Se, DO  Referring Physician: Mariel Shope, DO [address] [place],  [place] [address]  Chief complaint: Follow up for pulmonary embolism, interstitial lung disease  HPI: 83 year old with history of Takotsubo cardiomyopathy, hyperlipidemia, hypertension, DVT/PE  Underwent left hip arthroplasty September 2019 and susequentlly developed left leg swelling which worsened.  A lower extremity Doppler on 1/7 showed DVT and placed on Xarelto .  Had a primary care visit with cough, chest congestion.  Noted to have bilateral crackles.  Was treated with Omnicef  and Hycodan cough syrup but continues to have persistent symptoms. A CTA obtained showed a small nonocclusive thrombus in the right upper lobe and changes concerning for interstitial lung disease and she has been referred to pulmonary for further evaluation.  Chief complaint today is cough, nonproductive in nature.  Mild dyspnea on exertion.  No wheezing, fevers, chills.  She has occasional dysphagia, food getting stuck on swallowing.  Also complains of dry mouth, dry eyes and acid reflux.  Pets: Outside cat, no dogs, birds, farm animals Occupation: Armed forces operational officer for [employer], worked as a Tree surgeon.  Currently retired. Exposures: No known exposures, no mold, hot tub, Jacuzzi, humidifier Smoking history: Never smoker Travel history: No significant travel history Relevant family history: Brothers and father have emphysema, COPD.  They are all smokers.  Her younger brother is currently hospitalized with spontaneous pneumothorax.  Father had lung issues.  Unclear if he had fibrosis.  Interim history: Discussed the use of AI scribe software for clinical note transcription with the patient, who gave verbal consent to proceed.  History of Present Illness Angela Preston is an 83 year old female with interstitial lung disease who presents with  worsening shortness of breath.  She has experienced two episodes of nocturnal dyspnea with intense coughing, occurring approximately two weeks apart. These episodes have not recurred recently, but she continues to feel significantly short of breath.  She describes being easily exhausted after activities such as grocery shopping, where she attempts to walk more but finds herself exhausted by the time she reaches the car. She notes that her shortness of breath seems worse than before.  Her past medical history includes interstitial lung disease, specifically idiopathic pulmonary fibrosis (IPF), which has shown some stability on CT scans but a decline in lung function tests since 2022. She is not currently on any medication for this condition.  No joint pains except for her ankles, which she attributes to aging.    Outpatient Encounter Medications as of 09/09/2023  Medication Sig   amLODipine  (NORVASC ) 5 MG tablet TAKE 1 TABLET(5 MG) BY MOUTH DAILY   carboxymethylcellulose (REFRESH PLUS) 0.5 % SOLN Place 1 drop into both eyes daily.   cetirizine  (ZYRTEC  ALLERGY) 10 MG tablet Take 1 tablet (10 mg total) by mouth at bedtime.   EPINEPHrine  0.3 mg/0.3 mL IJ SOAJ injection Inject 0.3 mg into the muscle as needed for anaphylaxis.   ezetimibe  (ZETIA ) 10 MG tablet TAKE 1 TABLET(10 MG) BY MOUTH DAILY   furosemide  (LASIX ) 20 MG tablet Take 40 mg for 3 days then resume 20 mg daily. (Patient taking differently: 20 mg daily as needed for fluid or edema.)   icosapent  Ethyl (VASCEPA ) 1 g capsule TAKE 2 CAPSULES(2 GRAMS) BY MOUTH TWICE DAILY   metoprolol  succinate (TOPROL -XL) 25 MG 24 hr tablet TAKE 1/2 TABLET(12.5 MG) BY MOUTH DAILY WITH OR IMMEDIATELY FOLLOWING  A MEAL   Misc Natural Products (LUTEIN 20 PO) Take 20 mg by mouth daily.   Multiple Vitamins-Minerals (MULTIVITAMIN WITH MINERALS) tablet Take 1 tablet by mouth daily with lunch.    nitroGLYCERIN  (NITROSTAT ) 0.4 MG SL tablet Place 1 tablet (0.4 mg total)  under the tongue every 5 (five) minutes as needed for chest pain.   Omega-3 Fatty Acids (FISH OIL) 1000 MG CAPS Take 1,000 mg by mouth 2 (two) times daily.    triamterene -hydrochlorothiazide  (MAXZIDE ) 75-50 MG tablet TAKE 1 TABLET BY MOUTH DAILY   Vitamin D , Ergocalciferol , (DRISDOL ) 1.25 MG (50000 UNIT) CAPS capsule Take 1 capsule (50,000 Units total) by mouth every 7 (seven) days.   losartan  (COZAAR ) 50 MG tablet Take 1 tablet (50 mg total) by mouth daily.   Facility-Administered Encounter Medications as of 09/09/2023  Medication   cyanocobalamin  (VITAMIN B12) injection 1,000 mcg   Physical Exam: Blood pressure 122/68, pulse 69, temperature 97.9 F (36.6 C), temperature source Oral, height 5\' 1"  (1.549 m), weight 150 lb (68 kg), SpO2 94%. Gen:      No acute distress HEENT:  EOMI, sclera anicteric Neck:     No masses; no thyromegaly Lungs:   Bibasal crackles CV:         Regular rate and rhythm; no murmurs Abd:      + bowel sounds; soft, non-tender; no palpable masses, no distension Ext:    No edema; adequate peripheral perfusion Skin:      Warm and dry; no rash Neuro: alert and oriented x 3 Psych: normal mood and affect   Data Reviewed: Imaging: Lower extremity ultrasound 05/26/2018- acute DVT in the left leg  CTA 06/30/2018- small nonocclusive pulmonary embolism in the right upper lobe, aortic, coronary atherosclerosis, patchy areas of peripheral groundglass and septal thickening bilaterally. I have reviewed the images personally.  High-resolution CT 07/19/2018- peripheral and basilar predominant subpleural groundglass, reticulation, traction bronchiectasis.  No honeycombing.   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  PFTs 07/20/2018 FVC 2.12 [83%], FEV1 1.95  [103%), F/F 92, TLC 73%, DLCO corrected 134%  07/05/2020 FVC 2.70 [81%], FEV1 1.82 [100%], F/F 91, TLC 3.23 [65%], DLCO 11.95 [60%]  01/25/2021 FVC 1.87 [82%], FEV1 1.75 [104%], F/F 94, diffusion capacity 12.81 [74%]  09/09/2023 FVC 1.54 [74%], FEV1 1.47 [96%], F/F96, TLC 4.60 [67%], DLCO 11.05 [66%] Mild restriction, mild diffusion defect  Labs ILD serologies 07/12/2018- Rheumatoid factor-27 CCP, ANA, myositis panel, Ro, La, SCL 70, hypersensitivity panel-negative  Cardiac: Echocardiogram 11/12/2022-LVEF 55 to 60%, grade 1 diastolic dysfunction, normal left RV systolic size and function.  Normal PA systolic pressure.  Estimated RVSP 35.9 Assessment & Plan Idiopathic Pulmonary Fibrosis (IPF) High-resolution CT reviewed with pulmonary fibrosis and probable UIP pattern Serologies are negative except for mild elevation in rheumatoid factor which is nonspecific  With progressive dyspnea and decline in lung function. Recent pulmonary function tests indicate decreased lung capacity compared to 2022. CT scan shows no change in lung scarring, but pulmonary function tests indicate worsening. Echocardiogram from June last year shows no pulmonary hypertension. Discussed potential benefits of medication to slow disease progression, including risks of gastrointestinal side effects such as nausea, vomiting, dyspepsia, and diarrhea, and the necessity for liver function monitoring. Engaged in shared decision-making, and she agrees to initiate treatment. Emphasized that the medication is not curative but  aims to slow disease progression and preserve lung function to delay oxygen therapy.  No evidence of pulmonary hypertension on echocardiogram.  - Order liver function tests prior to medication initiation. - Order chest CT to assess current lung scarring. - Initiate pirfenidone medication regimen with option to switch if adverse effects occur. - Monitor for gastrointestinal side effects and liver function. -  Schedule follow-up in three months.  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 This appears to be chronic dating back to at least 2008  Plan/Recommendations: Start paperwork for pirfenidone Labs for baseline monitoring. Check BNP High-res CT  Phyllis Breeze MD [facility] 09/09/2023, 11:46 AM  CC: Kuneff, Renee A, DO

## 2023-09-09 NOTE — Progress Notes (Signed)
 Full PFT Performed Today

## 2023-09-09 NOTE — Patient Instructions (Signed)
 Full PFT Performed Today

## 2023-09-10 ENCOUNTER — Ambulatory Visit (INDEPENDENT_AMBULATORY_CARE_PROVIDER_SITE_OTHER)

## 2023-09-10 DIAGNOSIS — E538 Deficiency of other specified B group vitamins: Secondary | ICD-10-CM | POA: Diagnosis not present

## 2023-09-10 MED ORDER — CYANOCOBALAMIN 1000 MCG/ML IJ SOLN
1000.0000 ug | Freq: Once | INTRAMUSCULAR | Status: AC
  Administered 2023-09-10: 1000 ug via INTRAMUSCULAR

## 2023-09-10 NOTE — Progress Notes (Signed)
 Pt here for biweekly B12 injection per Dr Marylee Snowball. 2/4 today.    B12 1000mcg given IM, and pt tolerated injection well.   Next B12 injection scheduled for 2 weeks.

## 2023-09-14 ENCOUNTER — Ambulatory Visit (HOSPITAL_BASED_OUTPATIENT_CLINIC_OR_DEPARTMENT_OTHER)
Admission: RE | Admit: 2023-09-14 | Discharge: 2023-09-14 | Disposition: A | Discharge status: Home / Self Care | Source: Ambulatory Visit | Attending: Pulmonary Disease | Admitting: Pulmonary Disease

## 2023-09-14 ENCOUNTER — Ambulatory Visit (HOSPITAL_BASED_OUTPATIENT_CLINIC_OR_DEPARTMENT_OTHER)

## 2023-09-14 DIAGNOSIS — Z5181 Encounter for therapeutic drug level monitoring: Secondary | ICD-10-CM | POA: Diagnosis present

## 2023-09-14 DIAGNOSIS — J849 Interstitial pulmonary disease, unspecified: Secondary | ICD-10-CM | POA: Diagnosis present

## 2023-09-14 DIAGNOSIS — R0689 Other abnormalities of breathing: Secondary | ICD-10-CM | POA: Diagnosis present

## 2023-09-14 DIAGNOSIS — R0600 Dyspnea, unspecified: Secondary | ICD-10-CM | POA: Insufficient documentation

## 2023-09-17 ENCOUNTER — Telehealth: Payer: Self-pay

## 2023-09-17 NOTE — Telephone Encounter (Signed)
 Received new start paperwork for pirfenidone/Esbriet  Submitted a Prior Authorization request to [employer] for PIRFENIDONE via CoverMyMeds. Will update once we receive a response.  Key: [id number]   Geraldene Kleine, PharmD, MPH, BCPS, CPP Clinical Pharmacist (Rheumatology and Pulmonology)

## 2023-09-18 ENCOUNTER — Telehealth: Payer: Self-pay

## 2023-09-18 NOTE — Telephone Encounter (Signed)
 Copied from CRM [id number]. Topic: Clinical - Medication Question >> Sep 17, 2023  3:17 PM Angela Preston wrote: Reason for CRM: Patient states she received a phone call about starting a new medication due to the results of her CT, but no one has reached out to her to give her the results and states she is very confused and would like a call back from Dr. Darcy Eaton nurse.     Lm on home machine okay per HIPAA that it takes a few weeks for the CT results to come back and the new medication was from the paperwork that fill out on last OV.

## 2023-09-23 NOTE — Telephone Encounter (Signed)
 Received a fax regarding Prior Authorization from [facility] for PIRFENIDONE. Authorization has been DENIED because this plan only covers Part B drugs.  PA re-attempted through other [employer] plan on Cover My Meds: Key: [id number]  Geraldene Kleine, PharmD, MPH, BCPS, CPP Clinical Pharmacist (Rheumatology and Pulmonology)

## 2023-09-24 ENCOUNTER — Other Ambulatory Visit (HOSPITAL_COMMUNITY): Payer: Self-pay

## 2023-09-24 ENCOUNTER — Ambulatory Visit

## 2023-09-24 ENCOUNTER — Other Ambulatory Visit

## 2023-09-24 DIAGNOSIS — E538 Deficiency of other specified B group vitamins: Secondary | ICD-10-CM | POA: Diagnosis not present

## 2023-09-24 NOTE — Progress Notes (Signed)
Pt here for monthly B12 injection per Dr.Kuneff   B12 1000mcg given IM, and pt tolerated injection well.   Next B12 injection scheduled for 1 month.       

## 2023-09-24 NOTE — Telephone Encounter (Signed)
 Received notification from [facility] regarding a prior authorization for PIRFENIDONE. Authorization has been APPROVED from 09/23/2023 to 09/22/2024. Approval letter sent to scan center.  Unable to run test claim because patient must fill through [facility]: [phone]   Authorization # [id number]  Rx can be sent to [facility] after providing counseling  Geraldene Kleine, PharmD, MPH, BCPS, CPP Clinical Pharmacist (Rheumatology and Pulmonology)

## 2023-09-28 ENCOUNTER — Telehealth: Payer: Self-pay

## 2023-09-28 DIAGNOSIS — Z5181 Encounter for therapeutic drug level monitoring: Secondary | ICD-10-CM

## 2023-09-28 DIAGNOSIS — J849 Interstitial pulmonary disease, unspecified: Secondary | ICD-10-CM

## 2023-09-28 NOTE — Telephone Encounter (Signed)
 Attempted to call patient about pirfenidone. Will follow up.

## 2023-09-30 MED ORDER — PIRFENIDONE 267 MG PO TABS
ORAL_TABLET | ORAL | 0 refills | Status: DC
Start: 2023-09-30 — End: 2024-02-18

## 2023-09-30 MED ORDER — PIRFENIDONE 801 MG PO TABS: 801.0000 mg | ORAL_TABLET | Freq: Three times a day (TID) | ORAL | 4 refills | Status: DC

## 2023-09-30 NOTE — Telephone Encounter (Signed)
 Subjective:  Patient called today by [facility] Pulmonary pharmacy team for Esbriet new start.   Patient was last seen by Dr. Waylan Haggard on 09/09/2023.  Pertinent past medical history includes HTN, CAD, ILD, GERD, osteoporosis, CKD (stage 3b), history of DVT, and HLD.   History of elevated LFTs: No History of diarrhea, nausea, vomiting: No  Objective: Allergies  Allergen Reactions   Crestor [Rosuvastatin]     myalgias   Lipitor [Atorvastatin]     myalgias   Prolia  [Denosumab ] Other (See Comments)    Muscle cramps, leg weakness and tingling   Reclast  [Zoledronic  Acid] Other (See Comments)    Caused numbness in jaw and neck   Boniva  [Ibandronate ] Palpitations   Statins Other (See Comments)    Myalgia    Outpatient Encounter Medications as of 09/28/2023  Medication Sig   amLODipine  (NORVASC ) 5 MG tablet TAKE 1 TABLET(5 MG) BY MOUTH DAILY   carboxymethylcellulose (REFRESH PLUS) 0.5 % SOLN Place 1 drop into both eyes daily.   cetirizine  (ZYRTEC  ALLERGY) 10 MG tablet Take 1 tablet (10 mg total) by mouth at bedtime.   EPINEPHrine  0.3 mg/0.3 mL IJ SOAJ injection Inject 0.3 mg into the muscle as needed for anaphylaxis.   ezetimibe  (ZETIA ) 10 MG tablet TAKE 1 TABLET(10 MG) BY MOUTH DAILY   furosemide  (LASIX ) 20 MG tablet Take 40 mg for 3 days then resume 20 mg daily. (Patient taking differently: 20 mg daily as needed for fluid or edema.)   icosapent  Ethyl (VASCEPA ) 1 g capsule TAKE 2 CAPSULES(2 GRAMS) BY MOUTH TWICE DAILY   losartan  (COZAAR ) 50 MG tablet Take 1 tablet (50 mg total) by mouth daily.   metoprolol  succinate (TOPROL -XL) 25 MG 24 hr tablet TAKE 1/2 TABLET(12.5 MG) BY MOUTH DAILY WITH OR IMMEDIATELY FOLLOWING A MEAL   Misc Natural Products (LUTEIN 20 PO) Take 20 mg by mouth daily.   Multiple Vitamins-Minerals (MULTIVITAMIN WITH MINERALS) tablet Take 1 tablet by mouth daily with lunch.    nitroGLYCERIN  (NITROSTAT ) 0.4 MG SL tablet Place 1 tablet (0.4 mg total) under the tongue every 5  (five) minutes as needed for chest pain.   Omega-3 Fatty Acids (FISH OIL) 1000 MG CAPS Take 1,000 mg by mouth 2 (two) times daily.    triamterene -hydrochlorothiazide  (MAXZIDE ) 75-50 MG tablet TAKE 1 TABLET BY MOUTH DAILY   Vitamin D , Ergocalciferol , (DRISDOL ) 1.25 MG (50000 UNIT) CAPS capsule Take 1 capsule (50,000 Units total) by mouth every 7 (seven) days.   Facility-Administered Encounter Medications as of 09/28/2023  Medication   cyanocobalamin  (VITAMIN B12) injection 1,000 mcg     Immunization History  Administered Date(s) Administered   Influenza, High Dose Seasonal PF 03/12/2016, 04/26/2018, 01/31/2019, 03/09/2020   Influenza,inj,quad, With Preservative 02/16/2017, 01/31/2019   Influenza-Unspecified 03/21/2015, 02/23/2017, 02/16/2021, 03/04/2023   Moderna SARS-COV2 Booster Vaccination 04/20/2020   Moderna Sars-Covid-2 Vaccination 05/31/2019, 07/01/2019   Pneumococcal Conjugate-13 08/22/2015   Pneumococcal-Unspecified 05/19/2014      PFT's TLC  Date Value Ref Range Status  09/09/2023 3.10 L Final      CMP     Component Value Date/Time   NA 137 09/09/2023 1219   NA 140 11/26/2022 1037   K 3.8 09/09/2023 1219   CL 99 09/09/2023 1219   CO2 29 09/09/2023 1219   GLUCOSE 112 (H) 09/09/2023 1219   BUN 32 (H) 09/09/2023 1219   BUN 22 11/26/2022 1037   CREATININE 1.18 09/09/2023 1219   CREATININE 0.82 01/29/2015 0913   CALCIUM  10.0 09/09/2023 1219   PROT  8.2 09/09/2023 1219   PROT 7.3 10/27/2022 1003   ALBUMIN 4.8 09/09/2023 1219   ALBUMIN 4.4 10/20/2022 1030   AST 27 09/09/2023 1219   ALT 20 09/09/2023 1219   ALKPHOS 71 09/09/2023 1219   BILITOT 0.5 09/09/2023 1219   BILITOT 0.4 10/20/2022 1030   GFRNONAA 61 04/16/2020 1019   GFRAA 71 04/16/2020 1019      CBC    Component Value Date/Time   WBC 8.5 09/09/2023 1219   RBC 4.40 09/09/2023 1219   HGB 13.8 09/09/2023 1219   HGB 13.0 02/23/2023 1345   HGB 13.8 04/16/2020 1019   HCT 41.2 09/09/2023 1219   HCT  40.4 04/16/2020 1019   PLT 342.0 09/09/2023 1219   PLT 398 02/23/2023 1345   PLT 353 04/16/2020 1019   MCV 93.6 09/09/2023 1219   MCV 91 04/16/2020 1019   MCH 31.6 02/23/2023 1345   MCHC 33.4 09/09/2023 1219   RDW 13.0 09/09/2023 1219   RDW 11.7 04/16/2020 1019   LYMPHSABS 2.9 09/09/2023 1219   MONOABS 0.7 09/09/2023 1219   EOSABS 0.5 09/09/2023 1219   BASOSABS 0.1 09/09/2023 1219      LFT's    Latest Ref Rng & Units 09/09/2023   12:19 PM 10/27/2022   10:03 AM 10/20/2022   10:30 AM  Hepatic Function  Total Protein 6.0 - 8.3 g/dL 8.2  7.3  7.3   Albumin 3.5 - 5.2 g/dL 4.8   4.4   AST 0 - 37 U/L 27   18   ALT 0 - 35 U/L 20   15   Alk Phosphatase 39 - 117 U/L 71   78   Total Bilirubin 0.2 - 1.2 mg/dL 0.5   0.4       HRCT (81/19/1478) 1. Pulmonary parenchymal pattern of fibrosis, as detailed above, stable from 07/23/2022. Findings may be due to fibrotic nonspecific interstitial pneumonitis or usual interstitial pneumonitis  Assessment and Plan  Esbriet Medication Management Thoroughly counseled patient on the efficacy, mechanism of action, dosing, administration, adverse effects, and monitoring parameters of Esbriet.  Patient verbalized understanding.   Goals of Therapy: Will not stop or reverse the progression of ILD. It will slow the progression of ILD.   Dosing: Starting dose will be Esbriet 267 mg 1 tablet three times daily for 7 days, then 2 tablets three times daily for 7 days, then 3 tablets three times daily.  Maintenance dose will be 801 mg 1 tablet three times daily if tolerated.  Stressed the importance of taking with meals and space at least 5-6 hours apart to minimize stomach upset.   Adverse Effects: Nausea, vomiting, diarrhea, weight loss Abdominal pain GERD Sun sensitivity/rash - patient advised to wear sunscreen when exposed to sunlight Dizziness Fatigue  Monitoring: Monitor for diarrhea, nausea and vomiting, GI perforation, hepatotoxicity  Monitor  LFTs - baseline, monthly for first 6 months, then every 3 months routinely CBC w differential at baseline and every 3 months routinely  Access: Approval of Esbriet through: insurance Rx sent to: [facility]: [phone]   Medication Reconciliation A drug regimen assessment was performed, including review of allergies, interactions, disease-state management, dosing and immunization history. Medications were reviewed with the patient, including name, instructions, indication, goals of therapy, potential side effects, importance of adherence, and safe use.  No significant medication interactions  Tolu Andjela Wickes, PharmD [employer] PGY-1

## 2023-10-01 ENCOUNTER — Ambulatory Visit: Payer: Self-pay | Admitting: Pulmonary Disease

## 2023-10-01 NOTE — Telephone Encounter (Addendum)
 I called her phone several times but there was no answer.  Voicemail box is full and cannot leave a message I have sent her a MyChart message about the results  Please let patient know that CT shows lung scarring.  It is stable compared to last year but has worsened over many years since at least 2020.  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  I am happy to call back when she is available to discuss and clarify further

## 2023-10-05 NOTE — Telephone Encounter (Addendum)
 Spoke to patient and discussed results/recommendations. She voiced her understanding.  She stated that she started Esbriet  on Friday.  Routing to Dr. Waylan Haggard to make aware

## 2023-10-07 ENCOUNTER — Encounter: Payer: Self-pay | Admitting: Cardiovascular Disease

## 2023-10-07 ENCOUNTER — Ambulatory Visit: Payer: Medicare Other | Attending: Cardiovascular Disease | Admitting: Cardiovascular Disease

## 2023-10-07 VITALS — BP 112/72 | HR 69 | Ht 61.0 in | Wt 162.4 lb

## 2023-10-07 DIAGNOSIS — I700 Atherosclerosis of aorta: Secondary | ICD-10-CM

## 2023-10-07 DIAGNOSIS — I2510 Atherosclerotic heart disease of native coronary artery without angina pectoris: Secondary | ICD-10-CM

## 2023-10-07 DIAGNOSIS — R7303 Prediabetes: Secondary | ICD-10-CM | POA: Diagnosis not present

## 2023-10-07 DIAGNOSIS — I10 Essential (primary) hypertension: Secondary | ICD-10-CM | POA: Diagnosis not present

## 2023-10-07 DIAGNOSIS — E782 Mixed hyperlipidemia: Secondary | ICD-10-CM | POA: Diagnosis not present
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
## 2023-10-07 NOTE — Patient Instructions (Signed)
 Medication Instructions:  NO CHANGES *If you need a refill on your cardiac medications before your next appointment, please call your pharmacy*  Lab Work: FASTING LIPID PANEL TODAY If you have labs (blood work) drawn today and your tests are completely normal, you will receive your results only by: MyChart Message (if you have MyChart) OR A paper copy in the mail If you have any lab test that is abnormal or we need to change your treatment, we will call you to review the results.  Testing/Procedures: NO TESTING  Follow-Up: At [facility], you and your health needs are our priority.  As part of our continuing mission to provide you with exceptional heart care, our providers are all part of one team.  This team includes your primary Cardiologist (physician) and Advanced Practice Providers or APPs (Physician Assistants and Nurse Practitioners) who all work together to provide you with the care you need, when you need it.  Your next appointment:   1 year(s)  Provider:   Luana Rumple, MD

## 2023-10-08 ENCOUNTER — Ambulatory Visit (INDEPENDENT_AMBULATORY_CARE_PROVIDER_SITE_OTHER)

## 2023-10-08 ENCOUNTER — Ambulatory Visit: Payer: Self-pay | Admitting: Cardiovascular Disease

## 2023-10-08 DIAGNOSIS — E538 Deficiency of other specified B group vitamins: Secondary | ICD-10-CM | POA: Diagnosis not present

## 2023-10-08 LAB — LIPID PANEL
Chol/HDL Ratio: 4.4 ratio (ref 0.0–4.4)
Cholesterol, Total: 196 mg/dL (ref 100–199)
HDL: 45 mg/dL (ref >39)
LDL Chol Calc (NIH): 106 mg/dL — ABNORMAL HIGH (ref 0–99)
Triglycerides: 261 mg/dL — ABNORMAL HIGH (ref 0–149)
VLDL Cholesterol Cal: 45 mg/dL — ABNORMAL HIGH (ref 5–40)

## 2023-10-08 NOTE — Progress Notes (Signed)
 Pt here for Biweekly B12 injection per Dr Marylee Snowball  B12 1000mcg given IM, and pt tolerated injection well.  Next B12 injection scheduled for 2 weeks.

## 2023-10-21 ENCOUNTER — Ambulatory Visit (INDEPENDENT_AMBULATORY_CARE_PROVIDER_SITE_OTHER)

## 2023-10-21 DIAGNOSIS — E538 Deficiency of other specified B group vitamins: Secondary | ICD-10-CM

## 2023-10-21 MED ORDER — CYANOCOBALAMIN 1000 MCG/ML IJ SOLN
1000.0000 ug | Freq: Once | INTRAMUSCULAR | Status: AC
  Administered 2023-10-21: 1000 ug via INTRAMUSCULAR

## 2023-10-21 NOTE — Progress Notes (Signed)
 Pt here for Biweekly B12 injection per Dr Marylee Snowball  B12 1000mcg given IM, and pt tolerated injection well.  Next B12 injection scheduled for 2 weeks.

## 2023-10-22 ENCOUNTER — Ambulatory Visit

## 2023-11-05 ENCOUNTER — Ambulatory Visit (INDEPENDENT_AMBULATORY_CARE_PROVIDER_SITE_OTHER)

## 2023-11-05 DIAGNOSIS — E538 Deficiency of other specified B group vitamins: Secondary | ICD-10-CM

## 2023-11-05 NOTE — Progress Notes (Signed)
Pt here for monthly B12 injection per Dr Kuneff ? ?B12 1000mcg given IM, and pt tolerated injection well. ? ?Next B12 injection scheduled for 2 weeks ? ?

## 2023-11-19 ENCOUNTER — Ambulatory Visit

## 2023-11-19 DIAGNOSIS — E538 Deficiency of other specified B group vitamins: Secondary | ICD-10-CM

## 2023-11-19 MED ORDER — CYANOCOBALAMIN 1000 MCG/ML IJ SOLN
1000.0000 ug | Freq: Once | INTRAMUSCULAR | Status: AC
  Administered 2023-11-19: 1000 ug via INTRAMUSCULAR

## 2023-11-19 NOTE — Progress Notes (Signed)
Pt here for monthly B12 injection per Dr.Kuneff   B12 1000mcg given IM, and pt tolerated injection well.   Next B12 injection scheduled for 1 month.       

## 2023-12-03 ENCOUNTER — Ambulatory Visit (INDEPENDENT_AMBULATORY_CARE_PROVIDER_SITE_OTHER)

## 2023-12-03 DIAGNOSIS — E538 Deficiency of other specified B group vitamins: Secondary | ICD-10-CM | POA: Diagnosis not present

## 2023-12-03 NOTE — Progress Notes (Signed)
Pt here for biweekly B12 injection per Dr. Claiborne Billings  B12 given IM, and pt tolerated injection well.  Next B12 injection scheduled for 2 weeks

## 2023-12-04 ENCOUNTER — Telehealth: Payer: Self-pay

## 2023-12-04 NOTE — Telephone Encounter (Signed)
 Copied from CRM [id number]. Topic: General - Other >> Dec 04, 2023 11:18 AM Angela Preston wrote: Reason for CRM: Patient called in stated she is billed for 2 injection, when she stated she has only get the b12 injection, would like for a nurse to give her a callback regarding this

## 2023-12-07 ENCOUNTER — Other Ambulatory Visit: Payer: Self-pay | Admitting: *Deleted

## 2023-12-07 MED ORDER — METOPROLOL SUCCINATE ER 25 MG PO TB24: 12.5000 mg | ORAL_TABLET | Freq: Every day | ORAL | 3 refills | Status: AC

## 2023-12-07 MED ORDER — TRIAMTERENE-HCTZ 75-50 MG PO TABS: 1.0000 | ORAL_TABLET | Freq: Every day | ORAL | 3 refills | Status: AC

## 2023-12-08 NOTE — Telephone Encounter (Signed)
 Spoke with pt. Advised pt to call the billing department to discuss additional charges. Pt understood and had no further questions.

## 2023-12-14 ENCOUNTER — Encounter: Payer: Self-pay | Admitting: Pulmonary Disease

## 2023-12-14 ENCOUNTER — Ambulatory Visit: Admitting: Pulmonary Disease

## 2023-12-14 VITALS — BP 124/70 | HR 53 | Ht 61.0 in | Wt 146.0 lb

## 2023-12-14 DIAGNOSIS — J849 Interstitial pulmonary disease, unspecified: Secondary | ICD-10-CM

## 2023-12-14 DIAGNOSIS — Z5181 Encounter for therapeutic drug level monitoring: Secondary | ICD-10-CM | POA: Diagnosis not present

## 2023-12-14 LAB — COMPREHENSIVE METABOLIC PANEL WITH GFR
ALT: 16 U/L (ref 0–35)
AST: 18 U/L (ref 0–37)
Albumin: 4.4 g/dL (ref 3.5–5.2)
Alkaline Phosphatase: 73 U/L (ref 39–117)
BUN: 20 mg/dL (ref 6–23)
CO2: 29 meq/L (ref 19–32)
Calcium: 9.7 mg/dL (ref 8.4–10.5)
Chloride: 102 meq/L (ref 96–112)
Creatinine, Ser: 1.02 mg/dL (ref 0.40–1.20)
GFR: 50.53 mL/min — ABNORMAL LOW (ref >60.00)
Glucose, Bld: 97 mg/dL (ref 70–99)
Potassium: 4.8 meq/L (ref 3.5–5.1)
Sodium: 140 meq/L (ref 135–145)
Total Bilirubin: 0.5 mg/dL (ref 0.2–1.2)
Total Protein: 7.4 g/dL (ref 6.0–8.3)

## 2023-12-14 NOTE — Patient Instructions (Signed)
 VISIT SUMMARY:  Today, we discussed your idiopathic pulmonary fibrosis and how you are managing your symptoms. You mentioned that your breathing varies day to day and that hot weather makes it worse. You also noted that the swelling in your legs and ankles has significantly improved with your current medication. We reviewed your tolerance to pirfenidone , which you have been taking for three months, and discussed how you manage the mild nausea it causes.  YOUR PLAN:  -IDIOPATHIC PULMONARY FIBROSIS: Idiopathic pulmonary fibrosis is a condition where the lungs become scarred over time for unknown reasons. You are currently managing this with pirfenidone , which aims to slow the progression of lung scarring. You should continue taking pirfenidone  three times daily. We will also order lab tests to monitor for any potential side effects of the medication. Overall, you are tolerating the medication well, with only mild nausea that you manage with anti-nausea medication.  INSTRUCTIONS:  Please continue taking pirfenidone  as prescribed. We will schedule a follow-up appointment in six months to monitor your condition and review your lab results.

## 2023-12-14 NOTE — Progress Notes (Signed)
 Angela Preston    [id number]    [birth year]  Primary Care Physician:Kuneff, Charlies LABOR, DO  Referring Physician: Catherine Charlies LABOR, DO [address] [place],  [place] [address]  Chief complaint:  Follow up for pulmonary embolism, IPF Started on pirfenidone  May 2025  HPI: 83 year old with history of Takotsubo cardiomyopathy, hyperlipidemia, hypertension, DVT/PE  Underwent left hip arthroplasty September 2019 and susequentlly developed left leg swelling which worsened.  A lower extremity Doppler on 1/7 showed DVT and placed on Xarelto .  Had a primary care visit with cough, chest congestion.  Noted to have bilateral crackles.  Was treated with Omnicef  and Hycodan cough syrup but continues to have persistent symptoms. A CTA obtained showed a small nonocclusive thrombus in the right upper lobe and changes concerning for interstitial lung disease and she has been referred to pulmonary for further evaluation.  Chief complaint today is cough, nonproductive in nature.  Mild dyspnea on exertion.  No wheezing, fevers, chills.  She has occasional dysphagia, food getting stuck on swallowing.  Also complains of dry mouth, dry eyes and acid reflux.   Interim history: Discussed the use of AI scribe software for clinical note transcription with the patient, who gave verbal consent to proceed.  History of Present Illness Angela Preston is an 83 year old female with interstitial lung disease who presents with worsening shortness of breath.  She has experienced two episodes of nocturnal dyspnea with intense coughing, occurring approximately two weeks apart. These episodes have not recurred recently, but she continues to feel significantly short of breath.  She describes being easily exhausted after activities such as grocery shopping, where she attempts to walk more but finds herself exhausted by the time she reaches the car. She notes that her shortness of breath seems worse than before.  Her past  medical history includes interstitial lung disease, specifically idiopathic pulmonary fibrosis (IPF), which has shown some stability on CT scans but a decline in lung function tests since 2022. She is not currently on any medication for this condition.  No joint pains except for her ankles, which she attributes to aging.   Angela Preston is an 83 year old female with idiopathic pulmonary fibrosis who presents for follow-up of her condition.  Dyspnea - Experiences variability in breathing, with some days more difficult than others - Symptoms are particularly affected by hot weather  Peripheral edema - Swelling in legs and ankles present for two to three years - Significant improvement in edema with current medication regimen  Medication tolerance (pirfenidone ) - Taking pirfenidone  for approximately three months at a dosage of three pills three times a day - Mild nausea occurs after taking pirfenidone  - Nausea is managed by eating with the medication and using a dissolvable antiemetic   Relevant Pulmonary History Pets: Outside cat, no dogs, birds, farm animals Occupation: Armed forces operational officer for [employer], worked as a Tree surgeon.  Currently retired. Exposures: No known exposures, no mold, hot tub, Jacuzzi, humidifier Smoking history: Never smoker Travel history: No significant travel history Relevant family history: Brothers and father have emphysema, COPD.  They are all smokers.  Her younger brother is currently hospitalized with spontaneous pneumothorax.  Father had lung issues.  Unclear if he had fibrosis.  Outpatient Encounter Medications as of 12/14/2023  Medication Sig   amLODipine  (NORVASC ) 5 MG tablet TAKE 1 TABLET(5 MG) BY MOUTH DAILY   carboxymethylcellulose (REFRESH PLUS) 0.5 % SOLN Place 1 drop into both eyes daily.  cetirizine  (ZYRTEC  ALLERGY) 10 MG tablet Take 1 tablet (10 mg total) by mouth at bedtime.   EPINEPHrine  0.3 mg/0.3 mL IJ SOAJ injection Inject 0.3 mg into  the muscle as needed for anaphylaxis.   ezetimibe  (ZETIA ) 10 MG tablet TAKE 1 TABLET(10 MG) BY MOUTH DAILY   furosemide  (LASIX ) 20 MG tablet Take 40 mg for 3 days then resume 20 mg daily. (Patient taking differently: 20 mg daily as needed for fluid or edema.)   icosapent  Ethyl (VASCEPA ) 1 g capsule TAKE 2 CAPSULES(2 GRAMS) BY MOUTH TWICE DAILY   losartan  (COZAAR ) 50 MG tablet Take 1 tablet (50 mg total) by mouth daily.   metoprolol  succinate (TOPROL -XL) 25 MG 24 hr tablet Take 0.5 tablets (12.5 mg total) by mouth daily.   Misc Natural Products (LUTEIN 20 PO) Take 20 mg by mouth daily.   Multiple Vitamins-Minerals (MULTIVITAMIN WITH MINERALS) tablet Take 1 tablet by mouth daily with lunch.    nitroGLYCERIN  (NITROSTAT ) 0.4 MG SL tablet Place 1 tablet (0.4 mg total) under the tongue every 5 (five) minutes as needed for chest pain.   Pirfenidone  (ESBRIET ) 267 MG TABS Take 1 tab three times daily for 7 days, then 2 tabs three times daily for 7 days, then 3 tabs three times daily thereafter. **Month 1   Pirfenidone  (ESBRIET ) 801 MG TABS Take 1 tablet (801 mg total) by mouth 3 (three) times daily with meals. **Month 2 and onwards   triamterene -hydrochlorothiazide  (MAXZIDE ) 75-50 MG tablet Take 1 tablet by mouth daily.   Vitamin D , Ergocalciferol , (DRISDOL ) 1.25 MG (50000 UNIT) CAPS capsule Take 1 capsule (50,000 Units total) by mouth every 7 (seven) days.   Facility-Administered Encounter Medications as of 12/14/2023  Medication   cyanocobalamin  (VITAMIN B12) injection 1,000 mcg   Physical Exam: Blood pressure 122/68, pulse 69, temperature 97.9 F (36.6 C), temperature source Oral, height 5' 1 (1.549 m), weight 150 lb (68 kg), SpO2 94%. Gen:      No acute distress HEENT:  EOMI, sclera anicteric Neck:     No masses; no thyromegaly Lungs:   Bibasal crackles CV:         Regular rate and rhythm; no murmurs Abd:      + bowel sounds; soft, non-tender; no palpable masses, no distension Ext:    No edema;  adequate peripheral perfusion Skin:      Warm and dry; no rash Neuro: alert and oriented x 3 Psych: normal mood and affect   Data Reviewed: Imaging: Lower extremity ultrasound 05/26/2018- acute DVT in the left leg  CTA 06/30/2018- small nonocclusive pulmonary embolism in the right upper lobe, aortic, coronary atherosclerosis, patchy areas of peripheral groundglass and septal thickening bilaterally. I have reviewed the images personally.  High-resolution CT 07/19/2018- peripheral and basilar predominant subpleural groundglass, reticulation, traction bronchiectasis.  No honeycombing.   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  PFTs 07/20/2018 FVC 2.12 [83%], FEV1 1.95 [103%), F/F 92, TLC 73%, DLCO corrected 134%  07/05/2020 FVC 2.70 [81%], FEV1 1.82 [100%], F/F 91, TLC 3.23 [65%], DLCO 11.95 [60%]  01/25/2021 FVC 1.87 [82%], FEV1 1.75 [104%], F/F 94, diffusion capacity 12.81 [74%]  09/09/2023 FVC 1.54 [74%], FEV1 1.47 [96%], F/F96, TLC 4.60 [67%], DLCO 11.05 [66%] Mild restriction, mild diffusion defect  Labs ILD serologies 07/12/2018- Rheumatoid factor-27 CCP, ANA, myositis panel, Ro, La,  SCL 70, hypersensitivity panel-negative  Cardiac: Echocardiogram 11/12/2022-LVEF 55 to 60%, grade 1 diastolic dysfunction, normal left RV systolic size and function.  Normal PA systolic pressure.  Estimated RVSP 35.9 Assessment & Plan Idiopathic Pulmonary Fibrosis High-resolution CT reviewed with pulmonary fibrosis and probable UIP pattern Serologies are negative except for mild elevation in rheumatoid factor which is nonspecific Initiated on pirfenidone  May 2025 for worsening PFTs  She reports symptomatic improvement, particularly in leg and ankle swelling, since initiating  pirfenidone  2 months ago. Mild nausea occurs post-medication, managed with anti-nausea medication. Overall, she tolerates pirfenidone  well. The treatment goal is to slow lung scarring progression. Physical examination reveals crackles, consistent with IPF.  Previous echocardiogram with no evidence of pulmonary hypertension.  BNP this year was normal.  - Continue pirfenidone  three times daily. - Order laboratory tests to monitor for potential side effects of pirfenidone . - Schedule follow-up appointment in six months.  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

## 2023-12-17 ENCOUNTER — Ambulatory Visit

## 2023-12-18 ENCOUNTER — Ambulatory Visit: Payer: Self-pay | Admitting: Pulmonary Disease

## 2023-12-18 ENCOUNTER — Ambulatory Visit (INDEPENDENT_AMBULATORY_CARE_PROVIDER_SITE_OTHER)

## 2023-12-18 DIAGNOSIS — E538 Deficiency of other specified B group vitamins: Secondary | ICD-10-CM

## 2023-12-18 MED ORDER — CYANOCOBALAMIN 1000 MCG/ML IJ SOLN
1000.0000 ug | Freq: Once | INTRAMUSCULAR | Status: AC
  Administered 2023-12-18: 1000 ug via INTRAMUSCULAR

## 2023-12-18 NOTE — Progress Notes (Signed)
Pt here for biweekly B12 injection per Dr. Claiborne Billings  B12 given IM, and pt tolerated injection well.  Next B12 injection scheduled for 2 weeks

## 2023-12-31 ENCOUNTER — Ambulatory Visit (INDEPENDENT_AMBULATORY_CARE_PROVIDER_SITE_OTHER)

## 2023-12-31 DIAGNOSIS — E538 Deficiency of other specified B group vitamins: Secondary | ICD-10-CM

## 2023-12-31 NOTE — Progress Notes (Signed)
Pt here for biweekly B12 injection per Dr. Claiborne Billings  B12 given IM, and pt tolerated injection well.  Next B12 injection scheduled for 2 weeks

## 2024-01-14 ENCOUNTER — Ambulatory Visit (INDEPENDENT_AMBULATORY_CARE_PROVIDER_SITE_OTHER)

## 2024-01-14 DIAGNOSIS — E538 Deficiency of other specified B group vitamins: Secondary | ICD-10-CM | POA: Diagnosis not present

## 2024-01-14 MED ORDER — CYANOCOBALAMIN 1000 MCG/ML IJ SOLN
1000.0000 ug | Freq: Once | INTRAMUSCULAR | Status: AC
  Administered 2024-01-14: 1000 ug via INTRAMUSCULAR

## 2024-01-14 NOTE — Progress Notes (Signed)
Pt here for biweekly B12 injection per Dr. Claiborne Billings  B12 given IM, and pt tolerated injection well.  Next B12 injection scheduled for 2 weeks

## 2024-01-19 ENCOUNTER — Ambulatory Visit: Admitting: Family Medicine

## 2024-01-20 ENCOUNTER — Other Ambulatory Visit: Payer: Self-pay | Admitting: Student

## 2024-01-20 MED ORDER — LOSARTAN POTASSIUM 50 MG PO TABS: 50.0000 mg | ORAL_TABLET | Freq: Every day | ORAL | 2 refills | Status: AC

## 2024-01-26 ENCOUNTER — Other Ambulatory Visit: Payer: Self-pay

## 2024-01-27 MED ORDER — FUROSEMIDE 20 MG PO TABS: ORAL_TABLET | ORAL | 2 refills | Status: AC

## 2024-01-28 ENCOUNTER — Ambulatory Visit (INDEPENDENT_AMBULATORY_CARE_PROVIDER_SITE_OTHER)

## 2024-01-28 DIAGNOSIS — E538 Deficiency of other specified B group vitamins: Secondary | ICD-10-CM | POA: Diagnosis not present

## 2024-01-28 NOTE — Progress Notes (Signed)
 Pt here for monthly B12 injection per Dr Catherine  Last B12 injection: 01/14/24  B12 1000mcg given IM, and pt tolerated injection well.  Next office visit is scheduled for: 02/18/24

## 2024-02-05 ENCOUNTER — Ambulatory Visit: Admitting: Family Medicine

## 2024-02-17 ENCOUNTER — Other Ambulatory Visit (HOSPITAL_BASED_OUTPATIENT_CLINIC_OR_DEPARTMENT_OTHER): Payer: Self-pay | Admitting: Family Medicine

## 2024-02-17 DIAGNOSIS — Z1231 Encounter for screening mammogram for malignant neoplasm of breast: Secondary | ICD-10-CM

## 2024-02-18 ENCOUNTER — Encounter: Payer: Self-pay | Admitting: Family Medicine

## 2024-02-18 ENCOUNTER — Ambulatory Visit: Admitting: Family Medicine

## 2024-02-18 VITALS — BP 114/70 | HR 68 | Temp 98.2°F | Wt 141.4 lb

## 2024-02-18 DIAGNOSIS — J849 Interstitial pulmonary disease, unspecified: Secondary | ICD-10-CM | POA: Diagnosis not present

## 2024-02-18 DIAGNOSIS — N1832 Chronic kidney disease, stage 3b: Secondary | ICD-10-CM

## 2024-02-18 DIAGNOSIS — E538 Deficiency of other specified B group vitamins: Secondary | ICD-10-CM | POA: Diagnosis not present

## 2024-02-18 DIAGNOSIS — R7309 Other abnormal glucose: Secondary | ICD-10-CM

## 2024-02-18 DIAGNOSIS — E559 Vitamin D deficiency, unspecified: Secondary | ICD-10-CM

## 2024-02-18 DIAGNOSIS — M810 Age-related osteoporosis without current pathological fracture: Secondary | ICD-10-CM | POA: Diagnosis not present

## 2024-02-18 DIAGNOSIS — Z23 Encounter for immunization: Secondary | ICD-10-CM

## 2024-02-18 LAB — HEMOGLOBIN A1C: Hgb A1c MFr Bld: 5.8 % (ref 4.6–6.5)

## 2024-02-18 MED ORDER — CYANOCOBALAMIN 1000 MCG/ML IJ SOLN
1000.0000 ug | Freq: Once | INTRAMUSCULAR | Status: AC
  Administered 2024-02-18: 1000 ug via INTRAMUSCULAR

## 2024-02-18 MED ORDER — VITAMIN D (ERGOCALCIFEROL) 1.25 MG (50000 UNIT) PO CAPS: 50000.0000 [IU] | ORAL_CAPSULE | ORAL | 3 refills | Status: AC

## 2024-02-18 NOTE — Patient Instructions (Signed)
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
## 2024-02-19 ENCOUNTER — Ambulatory Visit: Payer: Self-pay | Admitting: Family Medicine

## 2024-02-19 LAB — PTH, INTACT AND CALCIUM
Calcium: 9.7 mg/dL (ref 8.6–10.4)
PTH: 39 pg/mL (ref 16–77)

## 2024-02-19 LAB — VITAMIN D 25 HYDROXY (VIT D DEFICIENCY, FRACTURES): Vit D, 25-Hydroxy: 37 ng/mL (ref 30–100)

## 2024-02-19 LAB — EXTRA SPECIMEN

## 2024-02-22 ENCOUNTER — Encounter (HOSPITAL_BASED_OUTPATIENT_CLINIC_OR_DEPARTMENT_OTHER): Payer: Self-pay

## 2024-02-22 ENCOUNTER — Ambulatory Visit (HOSPITAL_BASED_OUTPATIENT_CLINIC_OR_DEPARTMENT_OTHER)
Admission: RE | Admit: 2024-02-22 | Discharge: 2024-02-22 | Disposition: A | Discharge status: Home / Self Care | Source: Ambulatory Visit | Attending: Family Medicine | Admitting: Family Medicine

## 2024-02-22 DIAGNOSIS — Z1231 Encounter for screening mammogram for malignant neoplasm of breast: Secondary | ICD-10-CM | POA: Diagnosis present

## 2024-02-24 ENCOUNTER — Ambulatory Visit: Payer: Self-pay | Admitting: Family Medicine

## 2024-02-24 ENCOUNTER — Other Ambulatory Visit: Payer: Self-pay

## 2024-02-24 MED ORDER — AMLODIPINE BESYLATE 5 MG PO TABS: 5.0000 mg | ORAL_TABLET | Freq: Every day | ORAL | 2 refills | Status: AC

## 2024-03-03 ENCOUNTER — Ambulatory Visit (INDEPENDENT_AMBULATORY_CARE_PROVIDER_SITE_OTHER)

## 2024-03-03 DIAGNOSIS — E538 Deficiency of other specified B group vitamins: Secondary | ICD-10-CM

## 2024-03-03 NOTE — Progress Notes (Signed)
 Pt here for monthly B12 injection per Dr Catherine  Last injection: 9/11  B12 1000mcg given IM, and pt tolerated injection well.  Next B12 injection scheduled for: 1 month

## 2024-03-17 ENCOUNTER — Ambulatory Visit (INDEPENDENT_AMBULATORY_CARE_PROVIDER_SITE_OTHER)

## 2024-03-17 DIAGNOSIS — E538 Deficiency of other specified B group vitamins: Secondary | ICD-10-CM

## 2024-03-17 NOTE — Progress Notes (Addendum)
 Pt here for monthly B12 injection per Dr Catherine  Last B12 injection: 10/16  B12 1000mcg given IM, and pt tolerated injection well.  Next B12 injection scheduled for: 2 weeks

## 2024-03-23 ENCOUNTER — Ambulatory Visit

## 2024-03-23 VITALS — Ht 61.0 in | Wt 141.0 lb

## 2024-03-23 DIAGNOSIS — Z0000 Encounter for general adult medical examination without abnormal findings: Secondary | ICD-10-CM | POA: Diagnosis not present
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
## 2024-03-23 NOTE — Patient Instructions (Signed)
 Angela Preston,  Thank you for taking the time for your Medicare Wellness Visit. I appreciate your continued commitment to your health goals. Please review the care plan we discussed, and feel free to reach out if I can assist you further.  Please note that Annual Wellness Visits do not include a physical exam. Some assessments may be limited, especially if the visit was conducted virtually. If needed, we may recommend an in-person follow-up with your provider.  Ongoing Care Seeing your primary care provider every 3 to 6 months helps us  monitor your health and provide consistent, personalized care. Last office visit on 02/18/2024.  Keep up the good work.  Referrals If a referral was made during today's visit and you haven't received any updates within two weeks, please contact the referred provider directly to check on the status.  Recommended Screenings:  Health Maintenance  Topic Date Due   DEXA scan (bone density measurement)  10/29/2023   Flu Shot  08/16/2024*   Medicare Annual Wellness Visit  03/23/2025   Breast Cancer Screening  02/21/2026   Meningitis B Vaccine  Aged Out   DTaP/Tdap/Td vaccine  Discontinued   Pneumococcal Vaccine for age over 58  Discontinued   Colon Cancer Screening  Discontinued   COVID-19 Vaccine  Discontinued   Zoster (Shingles) Vaccine  Discontinued  *Topic was postponed. The date shown is not the original due date.       03/23/2024   11:21 AM  Advanced Directives  Does Patient Have a Medical Advance Directive? Yes  Type of Estate Agent of [place];Living will    Vision: Annual vision screenings are recommended for early detection of glaucoma, cataracts, and diabetic retinopathy. These exams can also reveal signs of chronic conditions such as diabetes and high blood pressure.  Dental: Annual dental screenings help detect early signs of oral cancer, gum disease, and other conditions linked to overall health, including heart disease  and diabetes.  Please see the attached documents for additional preventive care recommendations.

## 2024-03-31 ENCOUNTER — Telehealth: Payer: Self-pay

## 2024-03-31 ENCOUNTER — Ambulatory Visit (INDEPENDENT_AMBULATORY_CARE_PROVIDER_SITE_OTHER)

## 2024-03-31 DIAGNOSIS — E538 Deficiency of other specified B group vitamins: Secondary | ICD-10-CM

## 2024-03-31 NOTE — Progress Notes (Signed)
 Pt here for monthly B12 injection per Dr Catherine; continue monthly injections as of 03/05/23.   Last injection: 03/03/24   B12 1000mcg given IM, and pt tolerated injection well.   Next B12 injection scheduled for: 1 month

## 2024-03-31 NOTE — Telephone Encounter (Signed)
 ATC patient at [phone]. LVMTCB.

## 2024-03-31 NOTE — Telephone Encounter (Signed)
 Received fax from [facility] regarding unable to reach patient regarding pirfenidone .   From chart review, I do not see a recommended change in therapy.   Patient does not routinely use MyChart per available information.   Will attempt to call patient to discuss.

## 2024-04-01 ENCOUNTER — Other Ambulatory Visit: Payer: Self-pay | Admitting: Pulmonary Disease

## 2024-04-01 DIAGNOSIS — J849 Interstitial pulmonary disease, unspecified: Secondary | ICD-10-CM

## 2024-04-04 NOTE — Telephone Encounter (Signed)
 Pt requesting refill of specialty medication - routing to Rx team to advise.

## 2024-04-04 NOTE — Telephone Encounter (Signed)
 Patient returned my call, left VM. ATC patient, LVM at [phone] with [facility] phone number and my phone number if she has questions.

## 2024-04-04 NOTE — Telephone Encounter (Signed)
 Refill sent for ESBRIET  to [facility]: [phone]   Dose: 801mg  tablet by mouth three times daily   Last OV: 12/14/23 Provider: Dr. Theophilus Pertinent labs: LFTs wnl 12/14/23  Next OV: due around 06/15/2024, not yet scheduled.  Routing to scheduling team for follow-up on appt scheduling  Aleck Puls, PharmD, BCPS Clinical Pharmacist  [facility]

## 2024-04-11 ENCOUNTER — Other Ambulatory Visit (HOSPITAL_BASED_OUTPATIENT_CLINIC_OR_DEPARTMENT_OTHER)

## 2024-04-20 ENCOUNTER — Ambulatory Visit

## 2024-04-20 ENCOUNTER — Ambulatory Visit (HOSPITAL_BASED_OUTPATIENT_CLINIC_OR_DEPARTMENT_OTHER)
Admission: RE | Admit: 2024-04-20 | Discharge: 2024-04-20 | Disposition: A | Source: Ambulatory Visit | Attending: Family Medicine | Admitting: Family Medicine

## 2024-04-20 DIAGNOSIS — E538 Deficiency of other specified B group vitamins: Secondary | ICD-10-CM | POA: Diagnosis not present

## 2024-04-20 DIAGNOSIS — M810 Age-related osteoporosis without current pathological fracture: Secondary | ICD-10-CM | POA: Insufficient documentation

## 2024-04-20 MED ORDER — CYANOCOBALAMIN 1000 MCG/ML IJ SOLN
1000.0000 ug | Freq: Once | INTRAMUSCULAR | Status: AC
  Administered 2024-04-20: 1000 ug via INTRAMUSCULAR

## 2024-04-20 NOTE — Progress Notes (Signed)
 Pt here for monthly B12 injection per Dr Catherine; continue monthly injections as of 03/05/23.   Last injection: 03/31/24   B12 1000mcg given IM, and pt tolerated injection well.   Next B12 injection scheduled for: 1 month

## 2024-04-22 ENCOUNTER — Other Ambulatory Visit: Payer: Self-pay

## 2024-04-22 NOTE — Telephone Encounter (Signed)
 Source  Angela Preston, Angela Preston (Patient)   Subject  Angela Preston, Angela Preston (Patient)   Topic  Clinical - Lab/Test Results    Communication  Reason for CRM: Patient called in in regards to mychart message form provider in regards to bone density results. Patient stated that she would like to try the fosamx medication.            [id number] - [place], [place] - [address]    Phone: [phone]    Fax: [phone]    Rx pending for Fosamax, please confirm qty and any refills.

## 2024-04-25 MED ORDER — ALENDRONATE SODIUM 70 MG PO TABS: 70.0000 mg | ORAL_TABLET | ORAL | 11 refills | Status: AC

## 2024-04-25 NOTE — Telephone Encounter (Signed)
 Please call patient I did go ahead and call in the Fosamax  1 tab weekly to help treat her osteoporosis. She had been on a similar medication in the past that caused her occasional palpitations.  If she finds that this medication also causes her palpitations, I then would recommend she discontinue.  Unfortunately there are not many other options as she is having side effects to the other formats available for treatment including Prolia .

## 2024-05-04 ENCOUNTER — Ambulatory Visit

## 2024-05-04 DIAGNOSIS — E538 Deficiency of other specified B group vitamins: Secondary | ICD-10-CM | POA: Diagnosis not present

## 2024-05-04 NOTE — Progress Notes (Signed)
 Pt here for monthly B12 injection per Dr Catherine; continue monthly injections as of 03/05/23.   Last injection: 04/20/24   B12 1000mcg given IM, and pt tolerated injection well.   Next B12 injection scheduled for: 1 month

## 2024-05-18 ENCOUNTER — Ambulatory Visit

## 2024-05-24 ENCOUNTER — Other Ambulatory Visit: Payer: Self-pay | Admitting: Cardiovascular Disease

## 2024-05-24 MED ORDER — EZETIMIBE 10 MG PO TABS: 10.0000 mg | ORAL_TABLET | Freq: Every day | ORAL | 1 refills | Status: AC

## 2024-05-25 ENCOUNTER — Ambulatory Visit

## 2024-05-25 DIAGNOSIS — E538 Deficiency of other specified B group vitamins: Secondary | ICD-10-CM | POA: Diagnosis not present

## 2024-05-25 NOTE — Progress Notes (Signed)
 Pt here for monthly B12 injection per Dr Catherine  Last B12 injection: 04/20/24    B12 1000mcg given IM, and pt tolerated injection well.  Next B12 injection scheduled for: 1 month

## 2024-06-08 ENCOUNTER — Ambulatory Visit

## 2024-06-08 DIAGNOSIS — E538 Deficiency of other specified B group vitamins: Secondary | ICD-10-CM

## 2024-06-08 NOTE — Progress Notes (Signed)
 Pt here for B12 injection per Dr Kuneff   B12 1000mcg given IM, and pt tolerated injection well.   Next B12 injection scheduled for: 1 month  Please sign in absence of PCP

## 2024-06-22 ENCOUNTER — Ambulatory Visit

## 2024-07-06 ENCOUNTER — Ambulatory Visit
